# Patient Record
Sex: Male | Born: 1945 | Race: White | Hispanic: No | Marital: Single | Smoking: Current every day smoker
Health system: Southern US, Community
[De-identification: ages and names within clinical notes are randomized; demographics above are authoritative.]

## PROBLEM LIST (undated history)

## (undated) DIAGNOSIS — K5792 Diverticulitis of intestine, part unspecified, without perforation or abscess without bleeding: Secondary | ICD-10-CM

## (undated) DIAGNOSIS — N529 Male erectile dysfunction, unspecified: Secondary | ICD-10-CM

## (undated) DIAGNOSIS — F329 Major depressive disorder, single episode, unspecified: Secondary | ICD-10-CM

## (undated) DIAGNOSIS — N200 Calculus of kidney: Secondary | ICD-10-CM

## (undated) DIAGNOSIS — I255 Ischemic cardiomyopathy: Secondary | ICD-10-CM

## (undated) DIAGNOSIS — I219 Acute myocardial infarction, unspecified: Secondary | ICD-10-CM

## (undated) DIAGNOSIS — I2102 ST elevation (STEMI) myocardial infarction involving left anterior descending coronary artery: Secondary | ICD-10-CM

## (undated) DIAGNOSIS — I714 Abdominal aortic aneurysm, without rupture: Secondary | ICD-10-CM

## (undated) DIAGNOSIS — I2510 Atherosclerotic heart disease of native coronary artery without angina pectoris: Secondary | ICD-10-CM

## (undated) DIAGNOSIS — Z923 Personal history of irradiation: Secondary | ICD-10-CM

## (undated) DIAGNOSIS — Z9889 Other specified postprocedural states: Secondary | ICD-10-CM

## (undated) DIAGNOSIS — I712 Thoracic aortic aneurysm, without rupture: Secondary | ICD-10-CM

## (undated) DIAGNOSIS — I10 Essential (primary) hypertension: Secondary | ICD-10-CM

## (undated) DIAGNOSIS — N402 Nodular prostate without lower urinary tract symptoms: Secondary | ICD-10-CM

## (undated) DIAGNOSIS — E785 Hyperlipidemia, unspecified: Secondary | ICD-10-CM

## (undated) DIAGNOSIS — I739 Peripheral vascular disease, unspecified: Secondary | ICD-10-CM

## (undated) HISTORY — PX: ABDOMINAL AORTIC ANEURYSM REPAIR: SUR1152

## (undated) HISTORY — PX: OTHER SURGICAL HISTORY: SHX169

## (undated) HISTORY — PX: ABDOMINAL AORTIC ANEURYSM REPAIR: SHX42

## (undated) HISTORY — DX: Peripheral vascular disease, unspecified: I73.9

## (undated) HISTORY — DX: Hyperlipidemia, unspecified: E78.5

## (undated) HISTORY — DX: Major depressive disorder, single episode, unspecified: F32.9

## (undated) HISTORY — DX: Male erectile dysfunction, unspecified: N52.9

## (undated) HISTORY — DX: Calculus of kidney: N20.0

## (undated) HISTORY — DX: Diverticulitis of intestine, part unspecified, without perforation or abscess without bleeding: K57.92

## (undated) HISTORY — DX: Nodular prostate without lower urinary tract symptoms: N40.2

---
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
## 1997-08-24 ENCOUNTER — Encounter (HOSPITAL_COMMUNITY): Admission: RE | Admit: 1997-08-24 | Discharge: 1997-11-22 | Payer: Self-pay | Admitting: Gastroenterology

## 1997-12-08 ENCOUNTER — Ambulatory Visit (HOSPITAL_COMMUNITY): Admission: RE | Admit: 1997-12-08 | Discharge: 1997-12-08 | Payer: Self-pay | Admitting: Gastroenterology

## 1998-03-09 ENCOUNTER — Ambulatory Visit (HOSPITAL_COMMUNITY): Admission: RE | Admit: 1998-03-09 | Discharge: 1998-03-09 | Payer: Self-pay | Admitting: Gastroenterology

## 1998-04-10 ENCOUNTER — Encounter (HOSPITAL_COMMUNITY): Admission: RE | Admit: 1998-04-10 | Discharge: 1998-07-09 | Payer: Self-pay | Admitting: Gastroenterology

## 1998-07-31 ENCOUNTER — Encounter (HOSPITAL_COMMUNITY): Admission: RE | Admit: 1998-07-31 | Discharge: 1998-10-29 | Payer: Self-pay | Admitting: Gastroenterology

## 1998-11-21 ENCOUNTER — Encounter (HOSPITAL_COMMUNITY): Admission: RE | Admit: 1998-11-21 | Discharge: 1999-02-19 | Payer: Self-pay | Admitting: Gastroenterology

## 1998-12-17 ENCOUNTER — Emergency Department (HOSPITAL_COMMUNITY): Admission: EM | Admit: 1998-12-17 | Discharge: 1998-12-17 | Payer: Self-pay | Admitting: Emergency Medicine

## 1998-12-17 ENCOUNTER — Encounter: Payer: Self-pay | Admitting: Emergency Medicine

## 1999-03-15 ENCOUNTER — Encounter (HOSPITAL_COMMUNITY): Admission: RE | Admit: 1999-03-15 | Discharge: 1999-06-13 | Payer: Self-pay | Admitting: Gastroenterology

## 1999-06-06 ENCOUNTER — Encounter: Payer: Self-pay | Admitting: Emergency Medicine

## 1999-06-06 ENCOUNTER — Emergency Department (HOSPITAL_COMMUNITY): Admission: EM | Admit: 1999-06-06 | Discharge: 1999-06-06 | Payer: Self-pay | Admitting: Emergency Medicine

## 1999-06-07 ENCOUNTER — Ambulatory Visit (HOSPITAL_COMMUNITY): Admission: RE | Admit: 1999-06-07 | Discharge: 1999-06-07 | Payer: Self-pay | Admitting: Emergency Medicine

## 1999-06-07 ENCOUNTER — Encounter: Payer: Self-pay | Admitting: Emergency Medicine

## 1999-06-08 ENCOUNTER — Emergency Department (HOSPITAL_COMMUNITY): Admission: EM | Admit: 1999-06-08 | Discharge: 1999-06-09 | Payer: Self-pay | Admitting: Emergency Medicine

## 1999-06-17 ENCOUNTER — Ambulatory Visit (HOSPITAL_COMMUNITY): Admission: RE | Admit: 1999-06-17 | Discharge: 1999-06-18 | Payer: Self-pay | Admitting: *Deleted

## 1999-06-17 ENCOUNTER — Encounter (INDEPENDENT_AMBULATORY_CARE_PROVIDER_SITE_OTHER): Payer: Self-pay | Admitting: *Deleted

## 1999-07-25 ENCOUNTER — Encounter (HOSPITAL_COMMUNITY): Admission: RE | Admit: 1999-07-25 | Discharge: 1999-10-23 | Payer: Self-pay | Admitting: Gastroenterology

## 1999-12-05 ENCOUNTER — Encounter (HOSPITAL_COMMUNITY): Admission: RE | Admit: 1999-12-05 | Discharge: 2000-03-04 | Payer: Self-pay | Admitting: Gastroenterology

## 2000-05-04 ENCOUNTER — Encounter (HOSPITAL_COMMUNITY): Admission: RE | Admit: 2000-05-04 | Discharge: 2000-08-02 | Payer: Self-pay | Admitting: Gastroenterology

## 2000-09-08 ENCOUNTER — Encounter (HOSPITAL_COMMUNITY): Admission: RE | Admit: 2000-09-08 | Discharge: 2000-10-28 | Payer: Self-pay | Admitting: Gastroenterology

## 2000-10-30 ENCOUNTER — Encounter (HOSPITAL_COMMUNITY): Admission: RE | Admit: 2000-10-30 | Discharge: 2001-01-28 | Payer: Self-pay | Admitting: Gastroenterology

## 2001-03-18 ENCOUNTER — Encounter (HOSPITAL_COMMUNITY): Admission: RE | Admit: 2001-03-18 | Discharge: 2001-06-16 | Payer: Self-pay | Admitting: Gastroenterology

## 2001-07-12 ENCOUNTER — Encounter (HOSPITAL_COMMUNITY): Admission: RE | Admit: 2001-07-12 | Discharge: 2001-10-10 | Payer: Self-pay | Admitting: Gastroenterology

## 2001-11-01 ENCOUNTER — Encounter (HOSPITAL_COMMUNITY): Admission: RE | Admit: 2001-11-01 | Discharge: 2002-01-30 | Payer: Self-pay | Admitting: Gastroenterology

## 2002-03-02 ENCOUNTER — Encounter (HOSPITAL_COMMUNITY): Admission: RE | Admit: 2002-03-02 | Discharge: 2002-05-31 | Payer: Self-pay | Admitting: Gastroenterology

## 2002-07-06 ENCOUNTER — Encounter (HOSPITAL_COMMUNITY): Admission: RE | Admit: 2002-07-06 | Discharge: 2002-10-04 | Payer: Self-pay | Admitting: Gastroenterology

## 2002-11-22 ENCOUNTER — Encounter (HOSPITAL_COMMUNITY): Admission: RE | Admit: 2002-11-22 | Discharge: 2003-02-20 | Payer: Self-pay | Admitting: Gastroenterology

## 2003-03-09 ENCOUNTER — Encounter (HOSPITAL_COMMUNITY): Admission: RE | Admit: 2003-03-09 | Discharge: 2003-06-07 | Payer: Self-pay | Admitting: Gastroenterology

## 2003-03-21 ENCOUNTER — Emergency Department (HOSPITAL_COMMUNITY): Admission: EM | Admit: 2003-03-21 | Discharge: 2003-03-21 | Payer: Self-pay | Admitting: Emergency Medicine

## 2003-06-08 ENCOUNTER — Ambulatory Visit (HOSPITAL_COMMUNITY): Admission: RE | Admit: 2003-06-08 | Discharge: 2003-06-08 | Payer: Self-pay | Admitting: Gastroenterology

## 2004-07-09 ENCOUNTER — Encounter (HOSPITAL_COMMUNITY): Admission: RE | Admit: 2004-07-09 | Discharge: 2004-10-07 | Payer: Self-pay | Admitting: Gastroenterology

## 2004-10-24 ENCOUNTER — Encounter (HOSPITAL_COMMUNITY): Admission: RE | Admit: 2004-10-24 | Discharge: 2004-11-25 | Payer: Self-pay | Admitting: Gastroenterology

## 2005-02-28 ENCOUNTER — Encounter (HOSPITAL_COMMUNITY): Admission: RE | Admit: 2005-02-28 | Discharge: 2005-05-29 | Payer: Self-pay | Admitting: Gastroenterology

## 2005-06-13 ENCOUNTER — Encounter (HOSPITAL_COMMUNITY): Admission: RE | Admit: 2005-06-13 | Discharge: 2005-09-11 | Payer: Self-pay | Admitting: Gastroenterology

## 2005-10-16 ENCOUNTER — Encounter (HOSPITAL_COMMUNITY): Admission: RE | Admit: 2005-10-16 | Discharge: 2006-01-14 | Payer: Self-pay | Admitting: Gastroenterology

## 2006-02-05 ENCOUNTER — Encounter (HOSPITAL_COMMUNITY): Admission: RE | Admit: 2006-02-05 | Discharge: 2006-02-05 | Payer: Self-pay | Admitting: Gastroenterology

## 2006-03-31 DIAGNOSIS — I716 Thoracoabdominal aortic aneurysm, without rupture, unspecified: Secondary | ICD-10-CM

## 2006-03-31 DIAGNOSIS — I712 Thoracic aortic aneurysm, without rupture, unspecified: Secondary | ICD-10-CM | POA: Insufficient documentation

## 2006-03-31 DIAGNOSIS — Z8679 Personal history of other diseases of the circulatory system: Secondary | ICD-10-CM | POA: Insufficient documentation

## 2006-03-31 HISTORY — DX: Thoracoabdominal aortic aneurysm, without rupture, unspecified: I71.60

## 2006-03-31 HISTORY — PX: ABDOMINAL AORTIC ANEURYSM REPAIR: SUR1152

## 2006-03-31 HISTORY — DX: Thoracoabdominal aortic aneurysm, without rupture: I71.6

## 2006-08-06 ENCOUNTER — Encounter (HOSPITAL_COMMUNITY): Admission: RE | Admit: 2006-08-06 | Discharge: 2006-11-04 | Payer: Self-pay | Admitting: Gastroenterology

## 2006-11-10 ENCOUNTER — Encounter (HOSPITAL_COMMUNITY): Admission: RE | Admit: 2006-11-10 | Discharge: 2007-01-04 | Payer: Self-pay | Admitting: Gastroenterology

## 2007-01-07 ENCOUNTER — Encounter: Admission: RE | Admit: 2007-01-07 | Discharge: 2007-04-07 | Payer: Self-pay | Admitting: Gastroenterology

## 2007-04-15 ENCOUNTER — Encounter (HOSPITAL_COMMUNITY): Admission: RE | Admit: 2007-04-15 | Discharge: 2007-07-14 | Payer: Self-pay | Admitting: Gastroenterology

## 2010-12-23 LAB — IRON AND TIBC
Iron: <10 — ABNORMAL LOW
UIBC: 270

## 2010-12-23 LAB — CBC
HCT: 30.5 — ABNORMAL LOW
Hemoglobin: 10 — ABNORMAL LOW
MCHC: 32.9
MCV: 68.5 — ABNORMAL LOW
Platelets: 305
RBC: 4.45
RDW: 18 — ABNORMAL HIGH
WBC: 5.2

## 2011-01-03 LAB — CBC
Platelets: 319
RDW: 19.1 — ABNORMAL HIGH

## 2011-01-13 LAB — CBC
Hemoglobin: 11.5 — ABNORMAL LOW
RBC: 4.51
WBC: 4.8

## 2014-03-31 HISTORY — PX: COLONOSCOPY: SHX174

## 2016-09-19 ENCOUNTER — Encounter (HOSPITAL_COMMUNITY): Payer: Self-pay

## 2016-09-19 DIAGNOSIS — I10 Essential (primary) hypertension: Secondary | ICD-10-CM | POA: Insufficient documentation

## 2016-09-19 DIAGNOSIS — F17200 Nicotine dependence, unspecified, uncomplicated: Secondary | ICD-10-CM | POA: Insufficient documentation

## 2016-09-19 DIAGNOSIS — K0889 Other specified disorders of teeth and supporting structures: Secondary | ICD-10-CM | POA: Diagnosis present

## 2016-09-19 NOTE — ED Triage Notes (Signed)
Pt complaining of L lower dental pain. Pt states feels has an abscess. Pt denies any fevers/chills.

## 2016-09-20 ENCOUNTER — Emergency Department (HOSPITAL_COMMUNITY)
Admission: EM | Admit: 2016-09-20 | Discharge: 2016-09-20 | Disposition: A | Discharge status: Home / Self Care | Payer: Medicare HMO | Attending: Emergency Medicine | Admitting: Emergency Medicine

## 2016-09-20 DIAGNOSIS — K0889 Other specified disorders of teeth and supporting structures: Secondary | ICD-10-CM

## 2016-09-20 HISTORY — DX: Essential (primary) hypertension: I10

## 2016-09-20 MED ORDER — PENICILLIN V POTASSIUM 250 MG PO TABS
500.0000 mg | ORAL_TABLET | Freq: Once | ORAL | Status: AC
  Administered 2016-09-20: 500 mg via ORAL
  Filled 2016-09-20: qty 2

## 2016-09-20 MED ORDER — PENICILLIN V POTASSIUM 500 MG PO TABS: 500.0000 mg | ORAL_TABLET | Freq: Four times a day (QID) | ORAL | 0 refills | Status: AC

## 2016-09-20 NOTE — ED Notes (Signed)
Pt verbalized understanding discharge instructions and denies any further needs or questions at this time. VS stable, ambulatory and steady gait.   

## 2016-09-20 NOTE — ED Provider Notes (Signed)
[facility] DEPT Provider Note   CSN: [id number] Arrival date & time: 09/19/16  2233     History   Chief Complaint Chief Complaint  Patient presents with  . Dental Pain    HPI Patrick Adams is a 71 y.o. malewith history of hypertension who presents with a one-day history of left lower dental pain. Patient reports he woke up this morning with pain in his left lower molar radiating to his jaw and neck. He feels that it is swollen. Patient reports she has a known cavity in the tooth, and planned next week to have his dentist take care of it. Patient reports it had been hurting him prior to today. Patient took Tylenol at home with good relief. He is concerned that it may be infected as he has not had much of an appetite and has had some nausea today. Patient denies any fever or other symptoms.  HPI  Past Medical History:  Diagnosis Date  . Hypertension     There are no active problems to display for this patient.   History reviewed. No pertinent surgical history.     Home Medications    Prior to Admission medications   Medication Sig Start Date End Date Taking? Authorizing Provider  penicillin v potassium (VEETID) 500 MG tablet Take 1 tablet (500 mg total) by mouth 4 (four) times daily. 09/20/16 09/27/16  Frederica Kuster, PA-C    Family History History reviewed. No pertinent family history.  Social History Social History  Substance Use Topics  . Smoking status: Current Every Day Smoker  . Smokeless tobacco: Never Used  . Alcohol use No     Allergies   Statins   Review of Systems Review of Systems  Constitutional: Negative for fever.  HENT: Positive for dental problem.   Gastrointestinal: Positive for nausea. Negative for vomiting.     Physical Exam Updated Vital Signs BP 133/71 (BP Location: Right Arm)   Pulse 70   Temp 98.3 F (36.8 C) (Oral)   Resp 18   SpO2 100%   Physical Exam  Constitutional: He appears well-developed and well-nourished.  No distress.  HENT:  Head: Normocephalic and atraumatic.  Mouth/Throat: Oropharynx is clear and moist. No trismus in the jaw. No uvula swelling. No oropharyngeal exudate.    No tenderness to the floor the mouth  Eyes: Conjunctivae are normal. Pupils are equal, round, and reactive to light. Right eye exhibits no discharge. Left eye exhibits no discharge. No scleral icterus.  Neck: Normal range of motion. Neck supple. No edema and no erythema present. No thyromegaly present.  Cardiovascular: Normal rate, regular rhythm, normal heart sounds and intact distal pulses.  Exam reveals no gallop and no friction rub.   No murmur heard. Pulmonary/Chest: Effort normal and breath sounds normal. No stridor. No respiratory distress. He has no wheezes. He has no rales.  Musculoskeletal: He exhibits no edema.  Lymphadenopathy:    He has no cervical adenopathy.  Neurological: He is alert. Coordination normal.  Skin: Skin is warm and dry. No rash noted. He is not diaphoretic. No pallor.  Psychiatric: He has a normal mood and affect.  Nursing note and vitals reviewed.    ED Treatments / Results  Labs (all labs ordered are listed, but only abnormal results are displayed) Labs Reviewed - No data to display  EKG  EKG Interpretation None       Radiology No results found.  Procedures Procedures (including critical care time)  Medications Ordered in ED Medications  penicillin v potassium (VEETID) tablet 500 mg (500 mg Oral Given 09/20/16 0128)     Initial Impression / Assessment and Plan / ED Course  I have reviewed the triage vital signs and the nursing notes.  Pertinent labs & imaging results that were available during my care of the patient were reviewed by me and considered in my medical decision making (see chart for details).     Patient with dentalgia.  No abscess requiring immediate incision and drainage.  Exam not concerning for Ludwig's angina or pharyngeal abscess.  Will treat  with penicillin. Pt instructed to follow-up with dentist as soon as possible.  Discussed return precautions. Patient understands and agrees with plan. Pt safe for discharge. Patient also evaluated by Dr. Regenia Skeeter who agrees with plan.   Final Clinical Impressions(s) / ED Diagnoses   Final diagnoses:  Pain, dental    New Prescriptions Discharge Medication List as of 09/20/2016  1:14 AM    START taking these medications   Details  penicillin v potassium (VEETID) 500 MG tablet Take 1 tablet (500 mg total) by mouth 4 (four) times daily., Starting Sat 09/20/2016, Until Sat 09/27/2016, Print         Tonnette Zwiebel, [place], PA-C 09/20/16 [place], MD 09/22/16 [phone]

## 2016-09-20 NOTE — Discharge Instructions (Signed)
Medications: Penicillin  Treatment: take penicillin 4 times daily for 7 days as prescribed. Make sure to finish all this medication.  Follow-up: Please see your dentist as soon as possible for further evaluation and treatment. Please return to emergency department if you develop any new or worsening symptoms.

## 2018-01-29 DIAGNOSIS — I2510 Atherosclerotic heart disease of native coronary artery without angina pectoris: Secondary | ICD-10-CM

## 2018-01-29 DIAGNOSIS — Z9861 Coronary angioplasty status: Secondary | ICD-10-CM

## 2018-01-29 HISTORY — DX: Coronary angioplasty status: Z98.61

## 2018-01-29 HISTORY — DX: Atherosclerotic heart disease of native coronary artery without angina pectoris: I25.10

## 2018-01-31 ENCOUNTER — Emergency Department (HOSPITAL_COMMUNITY): Admit: 2018-01-31 | Payer: Medicare HMO | Admitting: Cardiology

## 2018-01-31 ENCOUNTER — Encounter (HOSPITAL_COMMUNITY): Payer: Self-pay

## 2018-01-31 ENCOUNTER — Inpatient Hospital Stay (HOSPITAL_COMMUNITY): Payer: Medicare HMO

## 2018-01-31 ENCOUNTER — Other Ambulatory Visit: Payer: Self-pay

## 2018-01-31 ENCOUNTER — Emergency Department (HOSPITAL_COMMUNITY): Payer: Medicare HMO

## 2018-01-31 ENCOUNTER — Encounter (HOSPITAL_COMMUNITY)
Admission: EM | Disposition: A | Discharge status: Home / Self Care | Payer: Self-pay | Source: Home / Self Care | Attending: Cardiology

## 2018-01-31 ENCOUNTER — Inpatient Hospital Stay (HOSPITAL_COMMUNITY)
Admission: EM | Admit: 2018-01-31 | Discharge: 2018-02-04 | DRG: 246 | Disposition: A | Discharge status: Home / Self Care | Payer: Medicare HMO | Attending: Cardiovascular Disease | Admitting: Cardiovascular Disease

## 2018-01-31 DIAGNOSIS — J449 Chronic obstructive pulmonary disease, unspecified: Secondary | ICD-10-CM | POA: Diagnosis present

## 2018-01-31 DIAGNOSIS — I2102 ST elevation (STEMI) myocardial infarction involving left anterior descending coronary artery: Principal | ICD-10-CM | POA: Diagnosis present

## 2018-01-31 DIAGNOSIS — Z812 Family history of tobacco abuse and dependence: Secondary | ICD-10-CM | POA: Diagnosis not present

## 2018-01-31 DIAGNOSIS — Z888 Allergy status to other drugs, medicaments and biological substances status: Secondary | ICD-10-CM

## 2018-01-31 DIAGNOSIS — F17200 Nicotine dependence, unspecified, uncomplicated: Secondary | ICD-10-CM | POA: Diagnosis present

## 2018-01-31 DIAGNOSIS — I2510 Atherosclerotic heart disease of native coronary artery without angina pectoris: Secondary | ICD-10-CM | POA: Clinically undetermined

## 2018-01-31 DIAGNOSIS — Z716 Tobacco abuse counseling: Secondary | ICD-10-CM

## 2018-01-31 DIAGNOSIS — Z8679 Personal history of other diseases of the circulatory system: Secondary | ICD-10-CM | POA: Diagnosis not present

## 2018-01-31 DIAGNOSIS — I110 Hypertensive heart disease with heart failure: Secondary | ICD-10-CM | POA: Diagnosis present

## 2018-01-31 DIAGNOSIS — I10 Essential (primary) hypertension: Secondary | ICD-10-CM | POA: Diagnosis present

## 2018-01-31 DIAGNOSIS — Z720 Tobacco use: Secondary | ICD-10-CM

## 2018-01-31 DIAGNOSIS — I5041 Acute combined systolic (congestive) and diastolic (congestive) heart failure: Secondary | ICD-10-CM | POA: Diagnosis present

## 2018-01-31 DIAGNOSIS — I255 Ischemic cardiomyopathy: Secondary | ICD-10-CM | POA: Diagnosis present

## 2018-01-31 DIAGNOSIS — I25119 Atherosclerotic heart disease of native coronary artery with unspecified angina pectoris: Secondary | ICD-10-CM | POA: Clinically undetermined

## 2018-01-31 DIAGNOSIS — I358 Other nonrheumatic aortic valve disorders: Secondary | ICD-10-CM | POA: Diagnosis present

## 2018-01-31 DIAGNOSIS — I25110 Atherosclerotic heart disease of native coronary artery with unstable angina pectoris: Secondary | ICD-10-CM | POA: Diagnosis present

## 2018-01-31 DIAGNOSIS — Z955 Presence of coronary angioplasty implant and graft: Secondary | ICD-10-CM

## 2018-01-31 DIAGNOSIS — I2109 ST elevation (STEMI) myocardial infarction involving other coronary artery of anterior wall: Secondary | ICD-10-CM | POA: Diagnosis present

## 2018-01-31 DIAGNOSIS — E785 Hyperlipidemia, unspecified: Secondary | ICD-10-CM | POA: Diagnosis present

## 2018-01-31 DIAGNOSIS — I213 ST elevation (STEMI) myocardial infarction of unspecified site: Secondary | ICD-10-CM

## 2018-01-31 HISTORY — DX: Ischemic cardiomyopathy: I25.5

## 2018-01-31 HISTORY — PX: LEFT HEART CATH AND CORONARY ANGIOGRAPHY: CATH118249

## 2018-01-31 HISTORY — DX: Hemochromatosis, unspecified: E83.119

## 2018-01-31 HISTORY — PX: CORONARY/GRAFT ACUTE MI REVASCULARIZATION: CATH118305

## 2018-01-31 HISTORY — DX: Other specified postprocedural states: Z98.890

## 2018-01-31 HISTORY — DX: ST elevation (STEMI) myocardial infarction involving left anterior descending coronary artery: I21.02

## 2018-01-31 LAB — LIPID PANEL
CHOLESTEROL: 208 mg/dL — AB (ref 0–200)
Cholesterol: 226 mg/dL — ABNORMAL HIGH (ref 0–200)
HDL: 36 mg/dL — ABNORMAL LOW (ref >40)
HDL: 34 mg/dL — ABNORMAL LOW (ref >40)
LDL CALC: 137 mg/dL — AB (ref 0–99)
LDL Cholesterol: 138 mg/dL — ABNORMAL HIGH (ref 0–99)
TRIGLYCERIDES: 263 mg/dL — AB (ref <150)
Total CHOL/HDL Ratio: 6.3 RATIO
Total CHOL/HDL Ratio: 6.1 RATIO
Triglycerides: 180 mg/dL — ABNORMAL HIGH (ref <150)
VLDL: 53 mg/dL — AB (ref 0–40)
VLDL: 36 mg/dL (ref 0–40)

## 2018-01-31 LAB — CBC
HCT: 43.8 % (ref 39.0–52.0)
HEMOGLOBIN: 14.4 g/dL (ref 13.0–17.0)
MCH: 28.9 pg (ref 26.0–34.0)
MCHC: 32.9 g/dL (ref 30.0–36.0)
MCV: 87.8 fL (ref 80.0–100.0)
NRBC: 0 % (ref 0.0–0.2)
PLATELETS: 259 10*3/uL (ref 150–400)
RBC: 4.99 MIL/uL (ref 4.22–5.81)
RDW: 12.9 % (ref 11.5–15.5)
WBC: 6.1 10*3/uL (ref 4.0–10.5)

## 2018-01-31 LAB — CBC WITH DIFFERENTIAL/PLATELET
ABS IMMATURE GRANULOCYTES: 0.02 10*3/uL (ref 0.00–0.07)
BASOS PCT: 0 %
Basophils Absolute: 0 10*3/uL (ref 0.0–0.1)
EOS PCT: 3 %
Eosinophils Absolute: 0.2 10*3/uL (ref 0.0–0.5)
HCT: 46.2 % (ref 39.0–52.0)
HEMOGLOBIN: 15.3 g/dL (ref 13.0–17.0)
Immature Granulocytes: 0 %
LYMPHS PCT: 26 %
Lymphs Abs: 1.9 10*3/uL (ref 0.7–4.0)
MCH: 29.2 pg (ref 26.0–34.0)
MCHC: 33.1 g/dL (ref 30.0–36.0)
MCV: 88.2 fL (ref 80.0–100.0)
MONOS PCT: 7 %
Monocytes Absolute: 0.5 10*3/uL (ref 0.1–1.0)
NEUTROS ABS: 4.6 10*3/uL (ref 1.7–7.7)
Neutrophils Relative %: 64 %
Platelets: 258 10*3/uL (ref 150–400)
RBC: 5.24 MIL/uL (ref 4.22–5.81)
RDW: 12.8 % (ref 11.5–15.5)
WBC: 7.3 10*3/uL (ref 4.0–10.5)
nRBC: 0 % (ref 0.0–0.2)

## 2018-01-31 LAB — COMPREHENSIVE METABOLIC PANEL
ALBUMIN: 3.6 g/dL (ref 3.5–5.0)
ALK PHOS: 106 U/L (ref 38–126)
ALK PHOS: 115 U/L (ref 38–126)
ALT: 13 U/L (ref 0–44)
ALT: 13 U/L (ref 0–44)
AST: 19 U/L (ref 15–41)
AST: 23 U/L (ref 15–41)
Albumin: 3.4 g/dL — ABNORMAL LOW (ref 3.5–5.0)
Anion gap: 12 (ref 5–15)
Anion gap: 10 (ref 5–15)
BILIRUBIN TOTAL: 1 mg/dL (ref 0.3–1.2)
BILIRUBIN TOTAL: 1.5 mg/dL — AB (ref 0.3–1.2)
BUN: 15 mg/dL (ref 8–23)
BUN: 15 mg/dL (ref 8–23)
CALCIUM: 9 mg/dL (ref 8.9–10.3)
CALCIUM: 9.6 mg/dL (ref 8.9–10.3)
CO2: 18 mmol/L — AB (ref 22–32)
CO2: 21 mmol/L — ABNORMAL LOW (ref 22–32)
CREATININE: 1.27 mg/dL — AB (ref 0.61–1.24)
CREATININE: 1.29 mg/dL — AB (ref 0.61–1.24)
Chloride: 104 mmol/L (ref 98–111)
Chloride: 102 mmol/L (ref 98–111)
GFR calc Af Amer: >60 mL/min (ref >60)
GFR calc Af Amer: >60 mL/min (ref >60)
GFR calc non Af Amer: 55 mL/min — ABNORMAL LOW (ref >60)
GFR calc non Af Amer: 56 mL/min — ABNORMAL LOW (ref >60)
GLUCOSE: 164 mg/dL — AB (ref 70–99)
Glucose, Bld: 196 mg/dL — ABNORMAL HIGH (ref 70–99)
POTASSIUM: 2.9 mmol/L — AB (ref 3.5–5.1)
Potassium: 3.8 mmol/L (ref 3.5–5.1)
SODIUM: 134 mmol/L — AB (ref 135–145)
Sodium: 133 mmol/L — ABNORMAL LOW (ref 135–145)
TOTAL PROTEIN: 6.1 g/dL — AB (ref 6.5–8.1)
Total Protein: 6.1 g/dL — ABNORMAL LOW (ref 6.5–8.1)

## 2018-01-31 LAB — APTT
aPTT: 32 seconds (ref 24–36)
aPTT: 200 seconds (ref 24–36)

## 2018-01-31 LAB — PROTIME-INR
INR: 1.01
INR: 1.19
PROTHROMBIN TIME: 15 s (ref 11.4–15.2)
Prothrombin Time: 13.2 seconds (ref 11.4–15.2)

## 2018-01-31 LAB — TROPONIN I
TROPONIN I: 0.11 ng/mL — AB (ref <0.03)
Troponin I: 0.1 ng/mL (ref <0.03)

## 2018-01-31 LAB — MRSA PCR SCREENING: MRSA by PCR: NEGATIVE

## 2018-01-31 LAB — HEMOGLOBIN A1C
Hgb A1c MFr Bld: 4.9 % (ref 4.8–5.6)
MEAN PLASMA GLUCOSE: 93.93 mg/dL

## 2018-01-31 SURGERY — CORONARY/GRAFT ACUTE MI REVASCULARIZATION
Anesthesia: LOCAL

## 2018-01-31 MED ORDER — VERAPAMIL HCL 2.5 MG/ML IV SOLN
INTRAVENOUS | Status: DC | PRN
  Administered 2018-01-31: 10 mL via INTRA_ARTERIAL

## 2018-01-31 MED ORDER — SODIUM CHLORIDE 0.9 % IV SOLN: INTRAVENOUS | Status: DC

## 2018-01-31 MED ORDER — HEPARIN SODIUM (PORCINE) 5000 UNIT/ML IJ SOLN
4000.0000 [IU] | Freq: Once | INTRAMUSCULAR | Status: AC
  Administered 2018-01-31: 4000 [IU] via INTRAVENOUS

## 2018-01-31 MED ORDER — LIDOCAINE HCL (PF) 1 % IJ SOLN
INTRAMUSCULAR | Status: AC
  Filled 2018-01-31: qty 30

## 2018-01-31 MED ORDER — SODIUM CHLORIDE 0.9 % IV SOLN: 250.0000 mL | INTRAVENOUS | Status: DC | PRN

## 2018-01-31 MED ORDER — MIDAZOLAM HCL 2 MG/2ML IJ SOLN
INTRAMUSCULAR | Status: AC
  Filled 2018-01-31: qty 2

## 2018-01-31 MED ORDER — HEPARIN SODIUM (PORCINE) 1000 UNIT/ML IJ SOLN
INTRAMUSCULAR | Status: AC
  Filled 2018-01-31: qty 1

## 2018-01-31 MED ORDER — ONDANSETRON HCL 4 MG/2ML IJ SOLN
4.0000 mg | Freq: Four times a day (QID) | INTRAMUSCULAR | Status: DC | PRN
  Administered 2018-02-01: 4 mg via INTRAVENOUS
  Filled 2018-01-31: qty 2

## 2018-01-31 MED ORDER — MIDAZOLAM HCL 2 MG/2ML IJ SOLN
INTRAMUSCULAR | Status: DC | PRN
  Administered 2018-01-31: 2 mg via INTRAVENOUS
  Administered 2018-01-31: 1 mg via INTRAVENOUS

## 2018-01-31 MED ORDER — FENTANYL CITRATE (PF) 100 MCG/2ML IJ SOLN
INTRAMUSCULAR | Status: AC
  Filled 2018-01-31: qty 2

## 2018-01-31 MED ORDER — TICAGRELOR 90 MG PO TABS
ORAL_TABLET | ORAL | Status: AC
  Filled 2018-01-31: qty 2

## 2018-01-31 MED ORDER — HEPARIN (PORCINE) IN NACL 100-0.45 UNIT/ML-% IJ SOLN
1250.0000 [IU]/h | INTRAMUSCULAR | Status: DC
  Administered 2018-02-01: 1100 [IU]/h via INTRAVENOUS
  Administered 2018-02-02 (×2): 1250 [IU]/h via INTRAVENOUS
  Filled 2018-01-31 (×3): qty 250

## 2018-01-31 MED ORDER — ACETAMINOPHEN 325 MG PO TABS
650.0000 mg | ORAL_TABLET | ORAL | Status: DC | PRN
  Administered 2018-02-01 – 2018-02-03 (×2): 650 mg via ORAL
  Filled 2018-01-31 (×2): qty 2

## 2018-01-31 MED ORDER — MORPHINE SULFATE (PF) 2 MG/ML IV SOLN
INTRAVENOUS | Status: DC | PRN
  Administered 2018-01-31: 2 mg via INTRAVENOUS

## 2018-01-31 MED ORDER — SODIUM CHLORIDE 0.9 % IV SOLN
INTRAVENOUS | Status: DC
  Administered 2018-01-31 – 2018-02-02 (×3): via INTRAVENOUS

## 2018-01-31 MED ORDER — HYDRALAZINE HCL 20 MG/ML IJ SOLN: 5.0000 mg | INTRAMUSCULAR | Status: AC | PRN

## 2018-01-31 MED ORDER — MORPHINE SULFATE (PF) 10 MG/ML IV SOLN
INTRAVENOUS | Status: AC
  Filled 2018-01-31: qty 1

## 2018-01-31 MED ORDER — SODIUM CHLORIDE 0.9 % IV SOLN
INTRAVENOUS | Status: DC | PRN
  Administered 2018-01-31: 1.75 mg/kg/h via INTRAVENOUS

## 2018-01-31 MED ORDER — TICAGRELOR 90 MG PO TABS
ORAL_TABLET | ORAL | Status: DC | PRN
  Administered 2018-01-31: 180 mg via ORAL

## 2018-01-31 MED ORDER — LIDOCAINE HCL (PF) 1 % IJ SOLN
INTRAMUSCULAR | Status: DC | PRN
  Administered 2018-01-31: 2 mL

## 2018-01-31 MED ORDER — BIVALIRUDIN TRIFLUOROACETATE 250 MG IV SOLR
INTRAVENOUS | Status: AC
  Filled 2018-01-31: qty 250

## 2018-01-31 MED ORDER — NITROGLYCERIN 1 MG/10 ML FOR IR/CATH LAB
INTRA_ARTERIAL | Status: AC
  Filled 2018-01-31: qty 10

## 2018-01-31 MED ORDER — LABETALOL HCL 5 MG/ML IV SOLN: 10.0000 mg | INTRAVENOUS | Status: AC | PRN

## 2018-01-31 MED ORDER — IOHEXOL 350 MG/ML SOLN
INTRAVENOUS | Status: DC | PRN
  Administered 2018-01-31: 165 mL

## 2018-01-31 MED ORDER — HEPARIN (PORCINE) IN NACL 1000-0.9 UT/500ML-% IV SOLN
INTRAVENOUS | Status: DC | PRN
  Administered 2018-01-31 (×2): 500 mL

## 2018-01-31 MED ORDER — HEPARIN (PORCINE) IN NACL 1000-0.9 UT/500ML-% IV SOLN
INTRAVENOUS | Status: AC
  Filled 2018-01-31: qty 1000

## 2018-01-31 MED ORDER — SODIUM CHLORIDE 0.9 % IV SOLN
INTRAVENOUS | Status: DC | PRN
  Administered 2018-01-31: 10 mL/h via INTRAVENOUS

## 2018-01-31 MED ORDER — SODIUM CHLORIDE 0.9% FLUSH: 3.0000 mL | INTRAVENOUS | Status: DC | PRN

## 2018-01-31 MED ORDER — SODIUM CHLORIDE 0.9% FLUSH
3.0000 mL | Freq: Two times a day (BID) | INTRAVENOUS | Status: DC
  Administered 2018-02-01: 3 mL via INTRAVENOUS
  Administered 2018-02-02: 10 mL via INTRAVENOUS

## 2018-01-31 MED ORDER — BIVALIRUDIN BOLUS VIA INFUSION - CUPID
INTRAVENOUS | Status: DC | PRN
  Administered 2018-01-31: 60.75 mg via INTRAVENOUS

## 2018-01-31 MED ORDER — FENTANYL CITRATE (PF) 100 MCG/2ML IJ SOLN
INTRAMUSCULAR | Status: DC | PRN
  Administered 2018-01-31 (×2): 25 ug via INTRAVENOUS

## 2018-01-31 MED ORDER — TICAGRELOR 90 MG PO TABS
90.0000 mg | ORAL_TABLET | Freq: Two times a day (BID) | ORAL | Status: DC
  Administered 2018-02-01 – 2018-02-04 (×6): 90 mg via ORAL
  Filled 2018-01-31 (×6): qty 1

## 2018-01-31 MED ORDER — SODIUM CHLORIDE 0.9 % IV SOLN
INTRAVENOUS | Status: DC | PRN
  Administered 2018-01-31: 100 mL/h via INTRAVENOUS

## 2018-01-31 MED ORDER — ASPIRIN 81 MG PO CHEW
81.0000 mg | CHEWABLE_TABLET | Freq: Every day | ORAL | Status: DC
  Administered 2018-02-01 – 2018-02-03 (×3): 81 mg via ORAL
  Filled 2018-01-31 (×3): qty 1

## 2018-01-31 MED ORDER — NITROGLYCERIN IN D5W 200-5 MCG/ML-% IV SOLN
0.0000 ug/min | INTRAVENOUS | Status: DC
Start: 2018-01-31 — End: 2018-01-31

## 2018-01-31 SURGICAL SUPPLY — 21 items
BALLN SAPPHIRE 2.0X12 (BALLOONS) ×2
BALLN SAPPHIRE ~~LOC~~ 4.0X12 (BALLOONS) ×1 IMPLANT
BALLN ~~LOC~~ EMERGE MR 3.5X15 (BALLOONS) ×2
BALLOON SAPPHIRE 2.0X12 (BALLOONS) IMPLANT
BALLOON ~~LOC~~ EMERGE MR 3.5X15 (BALLOONS) IMPLANT
CATH OPTITORQUE TIG 4.0 5F (CATHETERS) ×1 IMPLANT
CATH VISTA GUIDE 6FR XBLAD3.5 (CATHETERS) ×1 IMPLANT
DEVICE RAD COMP TR BAND LRG (VASCULAR PRODUCTS) ×1 IMPLANT
ELECT DEFIB PAD ADLT CADENCE (PAD) ×1 IMPLANT
GLIDESHEATH SLEND A-KIT 6F 22G (SHEATH) ×1 IMPLANT
GUIDEWIRE INQWIRE 1.5J.035X260 (WIRE) IMPLANT
INQWIRE 1.5J .035X260CM (WIRE) ×2
KIT ENCORE 26 ADVANTAGE (KITS) ×1 IMPLANT
KIT HEART LEFT (KITS) ×2 IMPLANT
PACK CARDIAC CATHETERIZATION (CUSTOM PROCEDURE TRAY) ×2 IMPLANT
STENT SYNERGY DES 2.75X38 (Permanent Stent) ×1 IMPLANT
TRANSDUCER W/STOPCOCK (MISCELLANEOUS) ×2 IMPLANT
TUBING CIL FLEX 10 FLL-RA (TUBING) ×2 IMPLANT
WIRE ASAHI PROWATER 180CM (WIRE) ×1 IMPLANT
WIRE FIGHTER CROSSING 190CM (WIRE) ×1 IMPLANT
WIRE PT2 MS 185 (WIRE) ×1 IMPLANT

## 2018-01-31 NOTE — Progress Notes (Signed)
Patient given a total of 2mg  of morphine. A total of 4 mg was documented and that is incorrect. Pharmacy was notified and requested documentation per this progress note. Witnesses include Judge Stall, RCIS, Gildardo Pounds, RN and Glenetta Hew, MD. Nurse on [facility] made aware that only 2 mg was actually given to the patient.

## 2018-01-31 NOTE — ED Provider Notes (Signed)
Emergency Department Provider Note   I have reviewed the triage vital signs and the nursing notes.   HISTORY  Chief Complaint Code STEMI   HPI Patrick Adams is a 72 y.o. male with PMH of HTN's to the emergency department for evaluation of left chest pain radiating to the arm and jaw.  Symptoms began at 5 PM and have progressively worsened.  EMS was called who administered 324 mg aspirin and 2 nitroglycerin in route.  Patient does have history of aortic aneurysm status post repair.  He denies similar symptoms in the past. No modifying factors. Pain is severe and pressure/squeezing in quality.   Past Medical History:  Diagnosis Date  . Hemochromatosis   . History of abdominal aortic aneurysm (AAA) repair    With thoracic aortic aneurysm being monitored  . Hypertension     Patient Active Problem List   Diagnosis Date Noted  . Acute ST elevation myocardial infarction (STEMI) involving left anterior descending coronary artery ([place]) 01/31/2018  . 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 Essential hypertension 01/31/2018  . Hyperlipidemia with target LDL less than 70 01/31/2018  . Acute ST elevation myocardial infarction (STEMI) involving left anterior descending (LAD) coronary artery ([place]) 01/31/2018  . Acute combined systolic and diastolic heart failure ([place]) 01/31/2018  . Ischemic cardiomyopathy 01/31/2018    Past Surgical History:  Procedure Laterality Date  . ABDOMINAL AORTIC ANEURYSM REPAIR      Allergies Statins  History reviewed. No pertinent family history.  Social History Social History   Tobacco Use  . Smoking status: Current Every Day Smoker  . Smokeless tobacco: Never Used  Substance Use Topics  . Alcohol use: No  . Drug use: No    Review of Systems  Constitutional: No fever/chills Eyes: No visual changes. ENT: No sore throat. Cardiovascular: Positive chest pain. Respiratory:  Denies shortness of breath. Gastrointestinal: No abdominal pain.  No nausea, no vomiting.  No diarrhea.  No constipation. Genitourinary: Negative for dysuria. Musculoskeletal: Negative for back pain. Skin: Negative for rash. Neurological: Negative for headaches, focal weakness or numbness.  10-point ROS otherwise negative.  ____________________________________________   PHYSICAL EXAM:  VITAL SIGNS: Vitals:   01/31/18 2200 01/31/18 2300  BP: 114/75 120/85  Pulse: 67 68  Resp: 16 18  Temp:    SpO2: 99% 100%   Constitutional: Alert and oriented. Patient appears acutely unwell and diaphoretic.  Eyes: Conjunctivae are normal.  Head: Atraumatic. Nose: No congestion/rhinnorhea. Mouth/Throat: Mucous membranes are moist.  Neck: No stridor.  Cardiovascular: Normal rate, regular rhythm. Good peripheral circulation. Grossly normal heart sounds.   Respiratory: Normal respiratory effort.  No retractions. Lungs CTAB. Gastrointestinal: No distention.  Musculoskeletal: No gross deformities of extremities. Neurologic:  Normal speech and language.  Skin:  Skin is warm but patient is diaphoretic.   ____________________________________________   LABS (all labs ordered are listed, but only abnormal results are displayed)  Labs Reviewed  COMPREHENSIVE METABOLIC PANEL - Abnormal; Notable for the following components:      Result Value   Sodium 134 (*)    CO2 18 (*)    Glucose, Bld 164 (*)    Creatinine, Ser 1.29 (*)    Total Protein 6.1 (*)    Total Bilirubin 1.5 (*)    GFR calc non Af Amer 55 (*)    All other components within normal limits  TROPONIN I - Abnormal; Notable for the following components:   Troponin I 0.10 (*)  All other components within normal limits  LIPID PANEL - Abnormal; Notable for the following components:   Cholesterol 226 (*)    Triglycerides 263 (*)    HDL 36 (*)    VLDL 53 (*)    LDL Cholesterol 137 (*)    All other components within normal limits    MRSA PCR SCREENING  CBC WITH DIFFERENTIAL/PLATELET  PROTIME-INR  APTT  LIPID PANEL  TROPONIN I  TROPONIN I  TROPONIN I  BASIC METABOLIC PANEL  CBC  HEMOGLOBIN A1C   ____________________________________________  EKG  Rate: 75. ST elevation V1-V3. Narrow QRS. Normal QTc. Positive STEMI.  ____________________________________________  RADIOLOGY  Dg Chest Port 1 View  Result Date: 01/31/2018 CLINICAL DATA:  Chest pain EXAM: PORTABLE CHEST 1 VIEW COMPARISON:  None. FINDINGS: Cardiac shadow is at the upper limits of normal in size. The lungs are well aerated bilaterally. No focal infiltrate is seen. No bony abnormality is noted. IMPRESSION: No acute abnormality noted. Electronically Signed   By: Inez Catalina M.D.   On: 01/31/2018 21:36   ____________________________________________   PROCEDURES  Procedure(s) performed:   Procedures  None  ____________________________________________   INITIAL IMPRESSION / ASSESSMENT AND PLAN / ED COURSE  Pertinent labs & imaging results that were available during my care of the patient were reviewed by me and considered in my medical decision making (see chart for details).  Arrives by EMS as a code STEMI.  Patient is diaphoretic and ill-appearing on arrival.  Evaluated by the STEMI team.  Cath Lab not immediately available so patient moved to ED room where heparin and nitroglycerin were started.  Patient maintaining his blood pressure and mental status.  He does not require airway protection.  After brief evaluation and stabilization he was transferred to the Cath Lab for intervention.   I reviewed all nursing notes, vitals, pertinent old records, EKGs, labs, imaging (as available).  ____________________________________________  FINAL CLINICAL IMPRESSION(S) / ED DIAGNOSES  Final diagnoses:  ST elevation myocardial infarction (STEMI), unspecified artery ([place])     MEDICATIONS GIVEN DURING THIS VISIT:  Medications  0.9 %  sodium  chloride infusion ( Intravenous Transfusing/Transfer 01/31/18 1900)  labetalol (NORMODYNE,TRANDATE) injection 10 mg (has no administration in time range)  hydrALAZINE (APRESOLINE) injection 5 mg (has no administration in time range)  acetaminophen (TYLENOL) tablet 650 mg (has no administration in time range)  ondansetron (ZOFRAN) injection 4 mg (has no administration in time range)  0.9 %  sodium chloride infusion (has no administration in time range)  sodium chloride flush (NS) 0.9 % injection 3 mL (has no administration in time range)  sodium chloride flush (NS) 0.9 % injection 3 mL (has no administration in time range)  0.9 %  sodium chloride infusion (has no administration in time range)  aspirin chewable tablet 81 mg (has no administration in time range)  ticagrelor (BRILINTA) tablet 90 mg (has no administration in time range)  heparin ADULT infusion 100 units/mL (25000 units/286mL sodium chloride 0.45%) (has no administration in time range)  heparin injection 4,000 Units (4,000 Units Intravenous Given 01/31/18 1859)    Note:  This document was prepared using Dragon voice recognition software and may include unintentional dictation errors.  Nanda Quinton, MD Emergency Medicine    Keshayla Schrum, [facility], MD 01/31/18 [phone]

## 2018-01-31 NOTE — H&P (Signed)
Cardiology Admission History and Physical:   Patient ID: Patrick Adams MRN: [id number]; DOB: Mar 14, 1946   Admission date: 01/31/2018  Primary Care Provider: Skeet Latch, NP Primary Cardiologist: No primary care provider on file.  New Primary Electrophysiologist:  None none  Chief Complaint: Chest pain  Patient Profile:   Patrick Adams is a 72 y.o. male with history of hypertension and hemochromatosis (treated with intermittent phlebotomy) also with what sounds like AAA status post repair with thoracic aortic aneurysm as well managed medically.  He was brought in by EMS for anterior-inferior ST elevation MI.  History of Present Illness:   Patrick Adams was doing well until roughly 5-5 30 this evening when he started having 5-7 out of 10 chest pain.  It then began to be close to 8/9 out of 10 chest pain he called EMS.  Upon arrival he was found to have anterior elevations in V2-V6 as well as inferior elevations in ruminal 2, 3 and aVF.  Code STEMI was called he was brought in to the most [facility] emergency room where he was stabilized awaiting Cath Lab team arrival.  He was then brought emergently to the cardiac catheterization lab for emergent catheterization.  EMS gave him 2 sodium nitro and 324 mg of aspirin with no resolution of chest pain.  In the ER he received 4000 units of IV heparin.  Past Medical History:  Diagnosis Date  . Hemochromatosis   . History of abdominal aortic aneurysm (AAA) repair    With thoracic aortic aneurysm being monitored  . Hypertension     Past Surgical History:  Procedure Laterality Date  . ABDOMINAL AORTIC ANEURYSM REPAIR       Medications Prior to Admission: Prior to Admission takes says he takes amlodipine for hypertension medications   Not on File     Allergies:    Allergies  Allergen Reactions  . Statins     Joint pain.    Social History:   Social History   Tobacco Use  . Smoking status: Current Every Day Smoker  . Smokeless  tobacco: Never Used  Substance Use Topics  . Alcohol use: No  . Drug use: No   Social History   Social History Narrative  . Not on file     Family History: Mother and father had MIs.  Both were in their 36s to 90s. The patient's family history is not on file.    ROS:  Review of Systems  Constitutional: Positive for malaise/fatigue (Felt bad today.). Negative for chills and fever.  HENT: Negative for nosebleeds.   Respiratory: Positive for shortness of breath (Today). Negative for cough.   Cardiovascular: Negative for leg swelling.  Gastrointestinal: Positive for nausea. Negative for blood in stool, melena and vomiting.  Genitourinary: Negative for hematuria.  Musculoskeletal: Negative for joint pain and myalgias.  Neurological: Positive for weakness (Generalized).  Psychiatric/Behavioral: Negative for depression and memory loss. The patient is not nervous/anxious.   All other systems reviewed and are negative.  Please see the history of present illness.  All other ROS reviewed and negative.     Physical Exam/Data:   Vitals:   01/31/18 1853  Height: 6' (1.829 m)    Intake/Output Summary (Last 24 hours) at 01/31/2018 1905 Last data filed at 01/31/2018 1901 Gross per 24 hour  Intake 0 ml  Output 0 ml  Net 0 ml   There were no vitals filed for this visit. There is no height or weight on file to calculate  BMI.  General:  Well nourished, well developed, in moderate distress.  Somewhat diaphoretic. HEENT: [place]/AT, EOMI.  MMM. Lymph: no adenopathy Neck: no JVD or carotid bruit Endocrine:  No thryomegaly Vascular: No carotid bruits; FA pulses 2+ bilaterally without bruits  Cardiac:  normal S1, S2; RRR; no murmur Lungs:  clear to auscultation bilaterally, no wheezing, rhonchi or rales  Abd: soft, nontender, no hepatomegaly  Ext: no pedal edema Musculoskeletal:  No deformities, BUE and BLE strength normal and equal Skin: warm and dry  Neuro:  CNs 2-12 intact, no focal  abnormalities noted Psych:  Normal affect    EKG:  The ECG that was done by EMS.  This was personally reviewed and demonstrates sinus rhythm rate 80 bpm.  3.5 mm ST elevations in V2 and V3 with slightly less elevations in V4 and V5.  Also 1 to 2 mm elevations in II, remember 3 and aVF.  No cervical change noted.  Relevant CV Studies: None available  Laboratory Data: Labs drawn in ER, but not yet ready  ChemistryNo results for input(s): NA, K, CL, CO2, GLUCOSE, BUN, CREATININE, CALCIUM, GFRNONAA, GFRAA, ANIONGAP in the last 168 hours.  No results for input(s): PROT, ALBUMIN, AST, ALT, ALKPHOS, BILITOT in the last 168 hours. HematologyNo results for input(s): WBC, RBC, HGB, HCT, MCV, MCH, MCHC, RDW, PLT in the last 168 hours. Cardiac EnzymesNo results for input(s): TROPONINI in the last 168 hours. No results for input(s): TROPIPOC in the last 168 hours.  BNPNo results for input(s): BNP, PROBNP in the last 168 hours.  DDimer No results for input(s): DDIMER in the last 168 hours.  Radiology/Studies:  No results found.  Assessment and Plan:   Acute anterior ST elevation MI --plan urgent/emergent cardiac catheterization with PCI if indicated.  Further plans based on results.  Will check lipid panel and diabetes panel. Reassess home medications and adjust accordingly. High-dose statin.  Severity of Illness: The appropriate patient status for this patient is INPATIENT. Inpatient status is judged to be reasonable and necessary in order to provide the required intensity of service to ensure the patient's safety. The patient's presenting symptoms, physical exam findings, and initial radiographic and laboratory data in the context of their chronic comorbidities is felt to place them at high risk for further clinical deterioration. Furthermore, it is not anticipated that the patient will be medically stable for discharge from the hospital within 2 midnights of admission. The following factors  support the patient status of inpatient.   " The patient's presenting symptoms include diaphoresis, chest pain, dyspnea.  Nausea. " The worrisome physical exam findings include diaphoretic somewhat pale in distress. " The initial radiographic and laboratory data are worrisome because of EKG with anterior and inferior ST elevations. " The chronic co-morbidities include hypertension, hemochromatosis and smoking    * I certify that at the point of admission it is my clinical judgment that the patient will require inpatient hospital care spanning beyond 2 midnights from the point of admission due to high intensity of service, high risk for further deterioration and high frequency of surveillance required.*    For questions or updates, please contact [facility] Please consult [email or web address] for contact info under        Signed, Glenetta Hew, MD  01/31/2018 7:05 PM

## 2018-01-31 NOTE — Progress Notes (Signed)
ANTICOAGULATION CONSULT NOTE - Initial Consult  Pharmacy Consult for heparin Indication: chest pain/ACS  Allergies  Allergen Reactions  . Statins     Joint pain.    Patient Measurements: Height: 6' (182.9 cm) IBW/kg (Calculated) : 77.6  Wt ~ 80kg   Vital Signs: Temp: 97.6 F (36.4 C) (11/03 2050) Temp Source: Oral (11/03 2050) BP: 113/83 (11/03 2100) Pulse Rate: 64 (11/03 2100)  Labs: Recent Labs    01/31/18 1900 01/31/18 1916 01/31/18 1928  HGB 15.3  --  14.4  HCT 46.2  --  43.8  PLT 258  --  259  APTT 32  --  200*  LABPROT 13.2  --  15.0  INR 1.01  --  1.19  CREATININE 1.29* 1.27*  --   TROPONINI 0.10*  --  0.11*    CrCl cannot be calculated (Unknown ideal weight.).   Medical History: Past Medical History:  Diagnosis Date  . Hemochromatosis   . History of abdominal aortic aneurysm (AAA) repair    With thoracic aortic aneurysm being monitored  . Hypertension     Medications:  No medications prior to admission.    Assessment: 72 yo male with STEMI s/p cath with DES placed and possible staged PCI. Pharmacy consulted to restart heparin 8 hrs post sheath (removed 8:18pm, TR band applied)  Goal of Therapy:  Heparin level 0.3-0.7 units/ml Monitor platelets by anticoagulation protocol: Yes   Plan:  -Start heparin at 1100 units/hr at 4:30pm -Heparin level in 8 hours and daily wth CBC daily  Hildred Laser, PharmD Clinical Pharmacist **Pharmacist phone directory can now be found on [email or web address] ([email or web address]).  Listed under [place].

## 2018-01-31 NOTE — ED Triage Notes (Signed)
Per ems pt had LEFT side CP radiating to arm and left jaw. Cp started at 1700.initially 7/10 progressed to 10/10. Received 324asa and 2 nitro en route. Has history of 2 aortic aneurysm 1 has been repaired.

## 2018-02-01 ENCOUNTER — Inpatient Hospital Stay (HOSPITAL_COMMUNITY): Payer: Medicare HMO

## 2018-02-01 ENCOUNTER — Encounter (HOSPITAL_COMMUNITY): Payer: Self-pay | Admitting: Cardiology

## 2018-02-01 DIAGNOSIS — Z720 Tobacco use: Secondary | ICD-10-CM

## 2018-02-01 DIAGNOSIS — Z812 Family history of tobacco abuse and dependence: Secondary | ICD-10-CM

## 2018-02-01 DIAGNOSIS — I2102 ST elevation (STEMI) myocardial infarction involving left anterior descending coronary artery: Secondary | ICD-10-CM

## 2018-02-01 DIAGNOSIS — E785 Hyperlipidemia, unspecified: Secondary | ICD-10-CM

## 2018-02-01 HISTORY — PX: TRANSTHORACIC ECHOCARDIOGRAM: SHX275

## 2018-02-01 LAB — POCT I-STAT, CHEM 8
BUN: 17 mg/dL (ref 8–23)
CHLORIDE: 101 mmol/L (ref 98–111)
Calcium, Ion: 1.28 mmol/L (ref 1.15–1.40)
Creatinine, Ser: 1.3 mg/dL — ABNORMAL HIGH (ref 0.61–1.24)
Glucose, Bld: 191 mg/dL — ABNORMAL HIGH (ref 70–99)
HCT: 42 % (ref 39.0–52.0)
Hemoglobin: 14.3 g/dL (ref 13.0–17.0)
Potassium: 3.1 mmol/L — ABNORMAL LOW (ref 3.5–5.1)
Sodium: 136 mmol/L (ref 135–145)
TCO2: 22 mmol/L (ref 22–32)

## 2018-02-01 LAB — TROPONIN I
TROPONIN I: 44.7 ng/mL — AB (ref <0.03)
TROPONIN I: 49.84 ng/mL — AB (ref <0.03)

## 2018-02-01 LAB — ECHOCARDIOGRAM COMPLETE: HEIGHTINCHES: 72 in

## 2018-02-01 LAB — CBC
HCT: 42 % (ref 39.0–52.0)
Hemoglobin: 14.3 g/dL (ref 13.0–17.0)
MCH: 29.7 pg (ref 26.0–34.0)
MCHC: 34 g/dL (ref 30.0–36.0)
MCV: 87.3 fL (ref 80.0–100.0)
PLATELETS: 223 10*3/uL (ref 150–400)
RBC: 4.81 MIL/uL (ref 4.22–5.81)
RDW: 12.9 % (ref 11.5–15.5)
WBC: 9 10*3/uL (ref 4.0–10.5)
nRBC: 0 % (ref 0.0–0.2)

## 2018-02-01 LAB — BASIC METABOLIC PANEL
Anion gap: 6 (ref 5–15)
BUN: 9 mg/dL (ref 8–23)
CO2: 22 mmol/L (ref 22–32)
CREATININE: 1.07 mg/dL (ref 0.61–1.24)
Calcium: 9.1 mg/dL (ref 8.9–10.3)
Chloride: 108 mmol/L (ref 98–111)
GLUCOSE: 106 mg/dL — AB (ref 70–99)
Potassium: 3.9 mmol/L (ref 3.5–5.1)
Sodium: 136 mmol/L (ref 135–145)

## 2018-02-01 LAB — FERRITIN: FERRITIN: 72 ng/mL (ref 24–336)

## 2018-02-01 LAB — IRON AND TIBC
Iron: 159 ug/dL (ref 45–182)
SATURATION RATIOS: 88 % — AB (ref 17.9–39.5)
TIBC: 181 ug/dL — AB (ref 250–450)
UIBC: 22 ug/dL

## 2018-02-01 LAB — LIPID PANEL
Cholesterol: 194 mg/dL (ref 0–200)
HDL: 34 mg/dL — AB (ref >40)
LDL CALC: 129 mg/dL — AB (ref 0–99)
Total CHOL/HDL Ratio: 5.7 RATIO
Triglycerides: 156 mg/dL — ABNORMAL HIGH (ref <150)
VLDL: 31 mg/dL (ref 0–40)

## 2018-02-01 LAB — POCT ACTIVATED CLOTTING TIME: ACTIVATED CLOTTING TIME: 676 s

## 2018-02-01 LAB — HEMOGLOBIN A1C
Hgb A1c MFr Bld: 4.9 % (ref 4.8–5.6)
Mean Plasma Glucose: 93.93 mg/dL

## 2018-02-01 LAB — HEPARIN LEVEL (UNFRACTIONATED): Heparin Unfractionated: 0.23 IU/mL — ABNORMAL LOW (ref 0.30–0.70)

## 2018-02-01 MED ORDER — PERFLUTREN LIPID MICROSPHERE
INTRAVENOUS | Status: AC
  Filled 2018-02-01: qty 10

## 2018-02-01 MED ORDER — EZETIMIBE 10 MG PO TABS
10.0000 mg | ORAL_TABLET | Freq: Every day | ORAL | Status: DC
  Administered 2018-02-02 – 2018-02-04 (×3): 10 mg via ORAL
  Filled 2018-02-01 (×2): qty 1

## 2018-02-01 MED ORDER — BISOPROLOL FUMARATE 5 MG PO TABS
2.5000 mg | ORAL_TABLET | Freq: Every day | ORAL | Status: DC
  Administered 2018-02-01 – 2018-02-04 (×2): 2.5 mg via ORAL
  Filled 2018-02-01 (×3): qty 1

## 2018-02-01 MED ORDER — TRAMADOL HCL 50 MG PO TABS
50.0000 mg | ORAL_TABLET | Freq: Four times a day (QID) | ORAL | Status: DC | PRN
  Administered 2018-02-01: 50 mg via ORAL
  Filled 2018-02-01: qty 1

## 2018-02-01 NOTE — Care Management Note (Signed)
Case Management Note  Patient Details  Name: Patrick Adams MRN: [id number] Date of Birth: 1945/06/11  Subjective/Objective:  72yo male presented with CP; s/p STEMI mid LAD with stent. PMH: HTN, HLD, hemochromatosis, AAA s/p repair, extensive smoking                  Action/Plan: CM met with patient to discuss transitional needs. Patient lived at home, independent with ADLs PTA with no DME in use. Patient verified PCP as: Clint Bolder PA; [facility]acy of choice[facility] [place]. Brilinta benefits check complete with est monthly cost $47; Brilinta 30-day free card provided and est monthly cost discussed, with patient verbalizing understanding; patient requesting his Rx be filled using the [facility] service. Patient stated an additional cath scheduled on 02/03/18. Patient indicated his sister could assist post transition and family to provide transport home. CM will continue to follow.   Expected Discharge Date:                  Expected Discharge Plan:  Home/Self Care  In-House Referral:  NA  Discharge planning Services  CM Consult, Medication Assistance(Brilinta card; benefits check)  Post Acute Care Choice:  NA Choice offered to:  NA  DME Arranged:  N/A DME Agency:  NA  HH Arranged:  NA HH Agency:  NA  Status of Service:  In process, will continue to follow  If discussed at Long Length of Stay Meetings, dates discussed:    Additional Comments:  Midge Minium RN, BSN, NCM-BC, ACM-RN [phone] 02/01/2018, 3:02 PM

## 2018-02-01 NOTE — Progress Notes (Signed)
Patient complains about chest discomfort but not pain associated with breathing. Tylenol is given. Continue to monitor the patient for any changes.

## 2018-02-01 NOTE — Progress Notes (Signed)
ANTICOAGULATION CONSULT NOTE - [place] for heparin Indication: chest pain/ACS  Allergies  Allergen Reactions  . Statins     Joint pain.    Patient Measurements: Height: 6' (182.9 cm) IBW/kg (Calculated) : 77.6  Wt ~ 80kg   Vital Signs: Temp: 98 F (36.7 C) (11/04 1203) Temp Source: Oral (11/04 1203) BP: 112/68 (11/04 0600) Pulse Rate: 58 (11/04 0600)  Labs: Recent Labs    01/31/18 1900 01/31/18 1916 01/31/18 1928 02/01/18 0239 02/01/18 0908 02/01/18 1252  HGB 15.3  --  14.4  --  14.3  --   HCT 46.2  --  43.8  --  42.0  --   PLT 258  --  259  --  223  --   APTT 32  --  200*  --   --   --   LABPROT 13.2  --  15.0  --   --   --   INR 1.01  --  1.19  --   --   --   HEPARINUNFRC  --   --   --   --   --  0.23*  CREATININE 1.29* 1.27*  --   --  1.07  --   TROPONINI 0.10*  --  0.11* 44.70* 49.84*  --     CrCl cannot be calculated (Unknown ideal weight.).   Medical History: Past Medical History:  Diagnosis Date  . Hemochromatosis   . History of abdominal aortic aneurysm (AAA) repair    With thoracic aortic aneurysm being monitored  . Hypertension     Medications:  Medications Prior to Admission  Medication Sig Dispense Refill Last Dose  . acetaminophen (TYLENOL) 500 MG tablet Take 1,000 mg by mouth every 6 (six) hours as needed for headache.   01/30/2018  . amLODipine (NORVASC) 5 MG tablet Take 5 mg by mouth daily.   01/31/2018  . aspirin EC 81 MG tablet Take 81 mg by mouth at bedtime.   01/30/2018  . cholecalciferol (VITAMIN D) 1000 units tablet Take 1,000 Units by mouth 2 (two) times a week.    Past Month at Unknown time  . ibuprofen (ADVIL,MOTRIN) 200 MG tablet Take 800 mg by mouth every 6 (six) hours as needed for headache or moderate pain.   01/30/2018    Assessment: 72 yo male with STEMI s/p cath with DES placed and possible staged PCI. Pharmacy consulted to restart heparin 8 hrs post sheath (removed 8:18pm, TR band  applied).  Initial heparin level subtherapeutic, no issues with infusion per RN.  Goal of Therapy:  Heparin level 0.3-0.7 units/ml Monitor platelets by anticoagulation protocol: Yes   Plan:  -Increase heparin to 1250 units/hr -Recheck with morning labs  Arrie Senate, PharmD, BCPS Clinical Pharmacist [phone] Please check AMION for all [place] numbers 02/01/2018

## 2018-02-01 NOTE — Plan of Care (Signed)
Patient alert and oriented.  Denies increased pain, 2/10.  Ambulates within room without difficulty.  Heart rate in the 50's. SB on the monitor.  Patient denies dizziness, or nausea.  Will monitor.

## 2018-02-01 NOTE — Progress Notes (Signed)
[id number] RN stated pt walked with staff last night. Pt tired and sleepy now so did not walk with him. MI education began with pt. Discussed the importance of brilinta with stent. Reviewed MI restrictions, gave heart healthy diet, NTG use, and smoking cessation. Did not want fake cigarette. Intends to quit cold [place]. Gave smoking cessation handout and encouraged to call 1800quitnow if needed. Needs to see case manager re brilinta. Will return tomorrow and continue ed.  Graylon Good RN BSN 02/01/2018 11:09 AM

## 2018-02-01 NOTE — Progress Notes (Signed)
  Echocardiogram 2D Echocardiogram has been performed.  Jannett Celestine 02/01/2018, 4:02 PM

## 2018-02-01 NOTE — Progress Notes (Addendum)
Progress Note  Patient Name: Patrick Adams Date of Encounter: 02/01/2018  Primary Cardiologist: harding  Subjective   Patient reports some residual chest soreness today, no overt chest pain.  No dyspnea, PND.  Inpatient Medications    Scheduled Meds: . aspirin  81 mg Oral Daily  . bisoprolol  2.5 mg Oral Daily  . ezetimibe  10 mg Oral Daily  . sodium chloride flush  3 mL Intravenous Q12H  . ticagrelor  90 mg Oral BID   Continuous Infusions: . sodium chloride 10 mL/hr at 01/31/18 1859  . sodium chloride    . heparin 1,100 Units/hr (02/01/18 0600)   PRN Meds: sodium chloride, acetaminophen, ondansetron (ZOFRAN) IV, sodium chloride flush   Vital Signs    Vitals:   02/01/18 0400 02/01/18 0500 02/01/18 0600 02/01/18 0812  BP: 111/75 117/75 112/68   Pulse: (!) 58 (!) 59 (!) 58   Resp: 12 12 13    Temp: 98.2 F (36.8 C)   99 F (37.2 C)  TempSrc: Oral   Oral  SpO2: 98% 98% 98%   Height:        Intake/Output Summary (Last 24 hours) at 02/01/2018 1033 Last data filed at 02/01/2018 0600 Gross per 24 hour  Intake 256.28 ml  Output 700 ml  Net -443.72 ml    I/O since admission:   There were no vitals filed for this visit.  Telemetry    NSR, some sinus brady but rates in upper 50's - Personally Reviewed  ECG    11/3 pre cath ECG (independently read by me): STE anterior/inferior leads,  11/4 ECG (independently read by me): anterolateral T wave inversions today post intervention  Physical Exam    BP 112/68   Pulse (!) 58   Temp 99 F (37.2 C) (Oral)   Resp 13   Ht 6' (1.829 m)   SpO2 98%  General: Alert, oriented, no distress.  Skin: normal turgor, no rashes, warm and dry HEENT: Normocephalic, atraumatic. Pupils equal round and reactive to light; sclera anicteric; extraocular muscles intact;  Mouth/Pharynx benign;  Neck: No JVD, no carotid bruits; normal carotid upstroke Lungs: clear to ausculatation and percussion; no wheezing or rales Chest wall:  without tenderness to palpitation Heart: PMI not displaced, RRR, s1 s2 normal,  no murmur, no rubs, gallops, thrills, or heaves Abdomen: soft, nontender; no hepatosplenomehaly, BS+; abdominal aorta nontender and not dilated by palpation. Back: no CVA tenderness Pulses 2+ Musculoskeletal: full range of motion, normal strength, no joint deformities Extremities: no clubbing cyanosis or edema, Neurologic: grossly nonfocal; Cranial nerves grossly wnl Psychologic: Normal mood and affect     Labs    Chemistry Recent Labs  Lab 01/31/18 1900 01/31/18 1916 02/01/18 0908  NA 134* 133* 136  K 3.8 2.9* 3.9  CL 104 102 108  CO2 18* 21* 22  GLUCOSE 164* 196* 106*  BUN 15 15 9   CREATININE 1.29* 1.27* 1.07  CALCIUM 9.6 9.0 9.1  PROT 6.1* 6.1*  --   ALBUMIN 3.6 3.4*  --   AST 23 19  --   ALT 13 13  --   ALKPHOS 115 106  --   BILITOT 1.5* 1.0  --   GFRNONAA 55* 56* >60  GFRAA >60 >60 >60  ANIONGAP 12 10 6      Hematology Recent Labs  Lab 01/31/18 1900 01/31/18 1928 02/01/18 0908  WBC 7.3 6.1 9.0  RBC 5.24 4.99 4.81  HGB 15.3 14.4 14.3  HCT 46.2 43.8 42.0  MCV 88.2 87.8 87.3  MCH 29.2 28.9 29.7  MCHC 33.1 32.9 34.0  RDW 12.8 12.9 12.9  PLT 258 259 223    Cardiac Enzymes Recent Labs  Lab 01/31/18 1900 01/31/18 1928 02/01/18 0239 02/01/18 0908  TROPONINI 0.10* 0.11* 44.70* 49.84*   No results for input(s): TROPIPOC in the last 168 hours.   BNPNo results for input(s): BNP, PROBNP in the last 168 hours.   DDimer No results for input(s): DDIMER in the last 168 hours.   Lipid Panel     Component Value Date/Time   CHOL 194 02/01/2018 0239   TRIG 156 (H) 02/01/2018 0239   HDL 34 (L) 02/01/2018 0239   CHOLHDL 5.7 02/01/2018 0239   VLDL 31 02/01/2018 0239   LDLCALC 129 (H) 02/01/2018 0239     Radiology    Dg Chest Port 1 View  Result Date: 01/31/2018 CLINICAL DATA:  Chest pain EXAM: PORTABLE CHEST 1 VIEW COMPARISON:  None. FINDINGS: Cardiac shadow is at the  upper limits of normal in size. The lungs are well aerated bilaterally. No focal infiltrate is seen. No bony abnormality is noted. IMPRESSION: No acute abnormality noted. Electronically Signed   By: Inez Catalina M.D.   On: 01/31/2018 21:36    Cardiac Studies   ECHO pending      Post-Intervention Diagram         Patient Profile     72 y.o. male with PMH HTN, HLD, hemochromatosis, AAA s/p repair, extensive smoking history presented evening of 11/3 with 10/10 chest pain radiating to the left arm and jaw.  Found to have elevated troponin and STE of the anterior and inferior leads.  Assessment & Plan    1. S/P STEMI mid LAD: Pt doing well post cath, culprit lesion successfully stented, still some distal occlusion in LAD, RCA with extensive disease as well see above.  ECHO pending  -additional medical therapy start with low dose bisoprolol 1/2 tab today heart rate on the low side, can add low dose losartan later if bp allows.  Pt had poor tolerance to ARB in the past due to dizziness -continue DAPT brillinta and asa, continue heparin for now -cannot tolerate statins he is very insistent of this fact failed crestor, lipitor, pravastatin, simvastatin and not willing to try a trial.  Will start ezetimibe may be candidate for PCSK9 in the future -will take to cath and revisit RCA lesion later this week   2. Hemochromatosis: stable   -will check ferritin, iron TIBC  3. COPD: stable no wheezing on exam, oxygenating well on room air  -using bisoprolol for bb -PRN duonebs  4. Tobacco use: patient decided to quit smoking  -encourage continued cessation on discharge   Signed, Vickki Muff MD PGY-2 Internal Medicine 02/01/2018, 10:33 AM     Patient seen and examined with Dr. Shan Levans. Angios reviewed and discussed treatment strategy with Dr. Ellyn Hack. Agree with assessment and plan.  Day 1 status post anterior STEMI secondary to mid LAD occlusion it successfully with DES stenting.   There continues to be diffuse distal apical LAD disease.  Patient has a large dominant RCA with extensive stenoses in the mid segment and the tightest stenosis in the PLA branch.  Will initiate low-dose beta-blocker today.  Patient had an extensive smoking history and started smoking at age 6.  He has decreased breath sounds with rhonchi.  Will initiate low-dose cardioselective beta-blockade.  Has reported statin intolerance history.  LDL increased to 129.  Will initiate Zetia  but I suspect will need to strongly consider PCSK9 inhibition.  Hydrate post cath.  We will tentatively plan for possible stage PCI to his mid and distal RCA on Wednesday.   Troy Sine, MD, [facility] 02/01/2018 11:07 AM

## 2018-02-01 NOTE — Care Management (Signed)
#    2.  S/W VANESSA  @ [facility] # [phone]  TICAGRELOR: NONE FORMULARY  BRILINTA  90 MG BID COVER- YES CO-PAY- $ 47.00 TIER- 3 DRUG PRIOR APPROVAL- NO  PREFERRED PHARMACY : YES [facility] 90 DAY SUPPL[facility] FOR M/O $ 136.00 90 DAY SUPPLY  FOR  RETAIL $ 141.00

## 2018-02-02 DIAGNOSIS — I255 Ischemic cardiomyopathy: Secondary | ICD-10-CM

## 2018-02-02 DIAGNOSIS — I10 Essential (primary) hypertension: Secondary | ICD-10-CM

## 2018-02-02 LAB — CBC
HCT: 38.5 % — ABNORMAL LOW (ref 39.0–52.0)
Hemoglobin: 13 g/dL (ref 13.0–17.0)
MCH: 29.8 pg (ref 26.0–34.0)
MCHC: 33.8 g/dL (ref 30.0–36.0)
MCV: 88.3 fL (ref 80.0–100.0)
Platelets: 184 10*3/uL (ref 150–400)
RBC: 4.36 MIL/uL (ref 4.22–5.81)
RDW: 13.1 % (ref 11.5–15.5)
WBC: 7.9 10*3/uL (ref 4.0–10.5)
nRBC: 0 % (ref 0.0–0.2)

## 2018-02-02 LAB — BASIC METABOLIC PANEL
Anion gap: 6 (ref 5–15)
BUN: 12 mg/dL (ref 8–23)
CALCIUM: 8.9 mg/dL (ref 8.9–10.3)
CO2: 21 mmol/L — ABNORMAL LOW (ref 22–32)
CREATININE: 1.13 mg/dL (ref 0.61–1.24)
Chloride: 107 mmol/L (ref 98–111)
GFR calc Af Amer: >60 mL/min (ref >60)
GFR calc non Af Amer: >60 mL/min (ref >60)
GLUCOSE: 102 mg/dL — AB (ref 70–99)
Potassium: 3.6 mmol/L (ref 3.5–5.1)
Sodium: 134 mmol/L — ABNORMAL LOW (ref 135–145)

## 2018-02-02 LAB — HEPARIN LEVEL (UNFRACTIONATED): Heparin Unfractionated: 0.33 IU/mL (ref 0.30–0.70)

## 2018-02-02 LAB — APTT: aPTT: 70 seconds — ABNORMAL HIGH (ref 24–36)

## 2018-02-02 LAB — PROTIME-INR
INR: 1.15
Prothrombin Time: 14.6 seconds (ref 11.4–15.2)

## 2018-02-02 MED ORDER — ASPIRIN 81 MG PO CHEW: 81.0000 mg | CHEWABLE_TABLET | ORAL | Status: AC

## 2018-02-02 MED ORDER — SODIUM CHLORIDE 0.9 % IV SOLN
INTRAVENOUS | Status: DC
  Administered 2018-02-03 (×2): via INTRAVENOUS

## 2018-02-02 MED ORDER — SODIUM CHLORIDE 0.9 % IV SOLN: 250.0000 mL | INTRAVENOUS | Status: DC | PRN

## 2018-02-02 MED ORDER — SODIUM CHLORIDE 0.9% FLUSH: 3.0000 mL | Freq: Two times a day (BID) | INTRAVENOUS | Status: DC

## 2018-02-02 MED ORDER — SODIUM CHLORIDE 0.9% FLUSH: 3.0000 mL | INTRAVENOUS | Status: DC | PRN

## 2018-02-02 NOTE — Progress Notes (Addendum)
Progress Note  Patient Name: Patrick Adams Date of Encounter: 02/02/2018  Primary Cardiologist: Dr. Ellyn Hack  Subjective   Patient appears comfortable without chest pain or dyspnea.   Inpatient Medications    Scheduled Meds: . aspirin  81 mg Oral Daily  . bisoprolol  2.5 mg Oral Daily  . ezetimibe  10 mg Oral Daily  . sodium chloride flush  3 mL Intravenous Q12H  . ticagrelor  90 mg Oral BID   Continuous Infusions: . sodium chloride 50 mL/hr at 02/02/18 0800  . sodium chloride    . heparin 1,250 Units/hr (02/02/18 0800)   PRN Meds: sodium chloride, acetaminophen, ondansetron (ZOFRAN) IV, sodium chloride flush, traMADol   Vital Signs    Vitals:   02/01/18 2000 02/02/18 0000 02/02/18 0500 02/02/18 0754  BP: 108/73 95/68 96/67  102/62  Pulse: (!) 54   (!) 56  Resp: 16   18  Temp: 98 F (36.7 C) 98.2 F (36.8 C) 97.8 F (36.6 C) 98.5 F (36.9 C)  TempSrc:    Oral  SpO2: 97%   95%  Height:        Intake/Output Summary (Last 24 hours) at 02/02/2018 0838 Last data filed at 02/02/2018 0800 Gross per 24 hour  Intake 1559.79 ml  Output 700 ml  Net 859.79 ml    I/O since admission: +603  There were no vitals filed for this visit.  Telemetry   Normal sinus rhythm- Personally Reviewed  ECG    ECG (independently read by me): 11/3 ST elevation in inferior lateral leads 11/4 sinus rhythm, T wave inversions  Physical Exam    BP 102/62 (BP Location: Left Arm)   Pulse (!) 56   Temp 98.5 F (36.9 C) (Oral)   Resp 18   Ht 6' (1.829 m)   SpO2 95%   Physical Exam  Constitutional: He appears well-developed and well-nourished. No distress.  HENT:  Head: Normocephalic and atraumatic.  Eyes: Conjunctivae are normal.  Cardiovascular: Normal rate, regular rhythm and normal heart sounds.  No murmur heard. Respiratory: Effort normal and breath sounds normal. No respiratory distress.  Diffuse rhonchi  GI: Soft. Bowel sounds are normal. He exhibits no  distension. There is no tenderness.  Musculoskeletal: He exhibits no edema.  Neurological: He is alert.  Skin: He is not diaphoretic.  Psychiatric: He has a normal mood and affect. His behavior is normal. Judgment and thought content normal.    Labs    Chemistry Recent Labs  Lab 01/31/18 1900 01/31/18 1916 01/31/18 2019 02/01/18 0908  NA 134* 133* 136 136  K 3.8 2.9* 3.1* 3.9  CL 104 102 101 108  CO2 18* 21*  --  22  GLUCOSE 164* 196* 191* 106*  BUN 15 15 17 9   CREATININE 1.29* 1.27* 1.30* 1.07  CALCIUM 9.6 9.0  --  9.1  PROT 6.1* 6.1*  --   --   ALBUMIN 3.6 3.4*  --   --   AST 23 19  --   --   ALT 13 13  --   --   ALKPHOS 115 106  --   --   BILITOT 1.5* 1.0  --   --   GFRNONAA 55* 56*  --  >60  GFRAA >60 >60  --  >60  ANIONGAP 12 10  --  6     Hematology Recent Labs  Lab 01/31/18 1928 01/31/18 2019 02/01/18 0908 02/02/18 0326  WBC 6.1  --  9.0 7.9  RBC 4.99  --  4.81 4.36  HGB 14.4 14.3 14.3 13.0  HCT 43.8 42.0 42.0 38.5*  MCV 87.8  --  87.3 88.3  MCH 28.9  --  29.7 29.8  MCHC 32.9  --  34.0 33.8  RDW 12.9  --  12.9 13.1  PLT 259  --  223 184    Cardiac Enzymes Recent Labs  Lab 01/31/18 1900 01/31/18 1928 02/01/18 0239 02/01/18 0908  TROPONINI 0.10* 0.11* 44.70* 49.84*   No results for input(s): TROPIPOC in the last 168 hours.   BNPNo results for input(s): BNP, PROBNP in the last 168 hours.   DDimer No results for input(s): DDIMER in the last 168 hours.   Lipid Panel     Component Value Date/Time   CHOL 194 02/01/2018 0239   TRIG 156 (H) 02/01/2018 0239   HDL 34 (L) 02/01/2018 0239   CHOLHDL 5.7 02/01/2018 0239   VLDL 31 02/01/2018 0239   LDLCALC 129 (H) 02/01/2018 0239     Radiology    Dg Chest Port 1 View  Result Date: 01/31/2018 CLINICAL DATA:  Chest pain EXAM: PORTABLE CHEST 1 VIEW COMPARISON:  None. FINDINGS: Cardiac shadow is at the upper limits of normal in size. The lungs are well aerated bilaterally. No focal infiltrate is  seen. No bony abnormality is noted. IMPRESSION: No acute abnormality noted. Electronically Signed   By: Inez Catalina M.D.   On: 01/31/2018 21:36    Cardiac Studies   CULPRIT LESION: Prox LAD lesion is 60% stenosed - tapers to Prox LAD to Mid LAD lesion is 100% thrombotic stenosis.  A drug-eluting stent was successfully placed (covering the upstream tapered lesion) using a STENT SYNERGY DES [id number]. -Postdilated in a tapered fashion (4.1-3.6-3.0 mm)  Post intervention, there is a 0% residual stenosis  ---- Residual Disease  Prox RCA to Mid RCA lesion is 55% stenosed. Mid RCA-1 lesion is 85% stenosed. Mid RCA-2 lesion is 40% stenosed. (Extensive segmental disease)  Post Atrio lesion is 90% stenosed. -somewhat ulcerated in appearance.  Ost 1st Mrg lesion is 65% stenosed.  --------------------------------------------------------  There is moderate to severe left ventricular systolic dysfunction. The left ventricular ejection fraction is 35-45% by visual estimate -  LV end diastolic pressure is moderately elevated.   SUMMARY:  Severe two-vessel CAD with 100% prox-midLAD stenosis (at what appeared to be a prior stenotic location) along with mid RCA 80%, RPA V-PL 90%.  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  RECOMMENDATIONS  Admit to CCU for post STEMI PCI care with TR band removal.  With existing disease in the RCA and RPL (in the presence of inferior ST elevations on initial EKG), we will restart IV heparin 8 hours after TR band removal. ? Need to consider the possibility of staged PCI during this hospitalization versus at a later date of the 2  lesions in the RCA-RPAV -depending on patient's symptoms.  If stable with preferably discharged home with plans for outpatient PCI in 2 to 3 weeks.  Check 2D echocardiogram preferably Tuesday to assess EF.  If truly would less than 40 consider the possibility of LifeVest.  Also consider the possibility of Entresto prior to discharge.  Currently blood pressure not high enough to  start beta-blocker or ARB. -Reevaluate in the morning.  Symptomatically euvolemic despite elevated LVEDP.  Will hold off on diuresis for now, but gentle post PCI hydration  Patient has intolerance of statins, will likely need CVRR consultation on discharge.  Consider Zetia while inpatient.  Recommend uninterrupted dual antiplatelet therapy with Aspirin 81mg  daily and Ticagrelor 90mg  twice daily for a minimum of 12 months (ACS - Class I recommendation).  Okay to stop aspirin at 6 months.      Post-Intervention Diagram         Patient Profile     72 y.o. male with history of AAA, hypertensino, hemochromatosis who presented with anterior-inferior STEMI.   Assessment & Plan    Acute anterior inferior STEMI: Two vessel CAD with prox-mid LAD stenosis, mid RCA 80%, RPA V-PL 90% with pci of prox-midLAD and DES placed. EKG (11/4) showing progression and t wave inversion in all leads.   Lipid panel: tcholes=194, LDL 129, triglycerides 156, HDL 34.  She does not tolerate statins and therefore started on Zetia 10 mg daily.  A1c normal range of 4.9.  Continue heparin, aspirin, Brilinta 90 mg, bisoprolol 2.5 mg daily.  Patient stable and will transfer to cardiac telemetry.   Patient scheduled for PCI tomorrow 02/03/2018.  Preop orders placed.  Acute combined systolic and diastolic heart failure  Ischemic Cardiomyopathy Echo showing LVEF 40 to 45%, anterior, anterior septal, apical severe hypokinesis and akinesis.  Aortic valve sclerosis without stenosis.  -Daily weights and I/O  Diffuse rhonchi and increased  secretions Ordered portable chest x-ray to examine for possible pneumonia.  Hemochromatosis Iron = 159, IBC = 181, saturation =88% ferritin = 72.  Patient stable at this time and can consider phlebotomy at a later time if needed.  Tobacco use Patient is started smoking since age 42.  Counseled patient on need for smoking cessation.   Lars Mage, MD Internal Medicine PGY2 Pager:[phone] 02/02/2018, 10:22 AM    Patient seen and examined. Agree with assessment and plan.  No recurrent chest pain, 2 status post anterior MI secondary to mid LAD occlusion.  EF 40 to 45% on echo with severe hypokinesis to involving the anterior anteroseptal and apical wall.  Renal function stable.  No recurrent anginal symptoms.  Peak troponin 49.84.  Okay to transfer out of CCU today to stepdown. Tolerating initiation of low-dose cardioselective beta-blockade with bisoprolol.  Blood pressure 102/62 and heart rate 56, precluding further titration presently.  Plan for staged PCI to the mid and distal RCA tomorrow.  Patient aware of risk benefits of procedure.  He was started on Zetia with reported statin intolerant history.  He may ultimately require PCSK9 inhibition for optimal therapy with target LDL less than 70.   Troy Sine, MD, [facility] 02/02/2018 1:05 PM

## 2018-02-02 NOTE — Progress Notes (Signed)
ANTICOAGULATION CONSULT NOTE - [place] for heparin Indication: chest pain/ACS  Allergies  Allergen Reactions  . Statins     Joint pain.    Patient Measurements: Height: 6' (182.9 cm) IBW/kg (Calculated) : 77.6  Wt ~ 80kg   Vital Signs: Temp: 98.5 F (36.9 C) (11/05 0754) Temp Source: Oral (11/05 0754) BP: 102/62 (11/05 0754) Pulse Rate: 56 (11/05 0754)  Labs: Recent Labs    01/31/18 1900  01/31/18 1928 01/31/18 2019 02/01/18 0239 02/01/18 0908 02/01/18 1252 02/02/18 0326 02/02/18 0855 02/02/18 1104  HGB 15.3  --  14.4 14.3  --  14.3  --  13.0  --   --   HCT 46.2  --  43.8 42.0  --  42.0  --  38.5*  --   --   PLT 258  --  259  --   --  223  --  184  --   --   APTT 32  --  200*  --   --   --   --   --   --  70*  LABPROT 13.2  --  15.0  --   --   --   --   --   --  14.6  INR 1.01  --  1.19  --   --   --   --   --   --  1.15  HEPARINUNFRC  --   --   --   --   --   --  0.23* 0.33  --   --   CREATININE 1.29*   < >  --  1.30*  --  1.07  --   --  1.13  --   TROPONINI 0.10*  --  0.11*  --  44.70* 49.84*  --   --   --   --    < > = values in this interval not displayed.    CrCl cannot be calculated (Unknown ideal weight.).   Medical History: Past Medical History:  Diagnosis Date  . Hemochromatosis   . History of abdominal aortic aneurysm (AAA) repair    With thoracic aortic aneurysm being monitored  . Hypertension     Medications:  Medications Prior to Admission  Medication Sig Dispense Refill Last Dose  . acetaminophen (TYLENOL) 500 MG tablet Take 1,000 mg by mouth every 6 (six) hours as needed for headache.   01/30/2018  . amLODipine (NORVASC) 5 MG tablet Take 5 mg by mouth daily.   01/31/2018  . aspirin EC 81 MG tablet Take 81 mg by mouth at bedtime.   01/30/2018  . cholecalciferol (VITAMIN D) 1000 units tablet Take 1,000 Units by mouth 2 (two) times a week.    Past Month at Unknown time  . ibuprofen (ADVIL,MOTRIN) 200 MG tablet  Take 800 mg by mouth every 6 (six) hours as needed for headache or moderate pain.   01/30/2018    Assessment: 72 yo male with STEMI s/p cath with DES placed and scheduled PCI 11/6. Pharmacy consulted to restart heparin 8 hrs post sheath on 11/3 and continued anticoagulation.  Heparin level therapeutic at 0.33. No bleeding noted. H&H stable.   Goal of Therapy:  Heparin level 0.3-0.7 units/ml Monitor platelets by anticoagulation protocol: Yes   Plan:  -Continue heparin at 1250 units/hr -HL with morning labs -monitor for signs/symtpoms of bleeding  Isaias Sakai, Pharm D PGY1 Pharmacy Resident  Phone [phone] Please use AMION for clinical pharmacists numbers  02/02/2018  12:52 PM

## 2018-02-02 NOTE — H&P (View-Only) (Signed)
Progress Note  Patient Name: Patrick Adams Date of Encounter: 02/02/2018  Primary Cardiologist: Dr. Ellyn Hack  Subjective   Patient appears comfortable without chest pain or dyspnea.   Inpatient Medications    Scheduled Meds: . aspirin  81 mg Oral Daily  . bisoprolol  2.5 mg Oral Daily  . ezetimibe  10 mg Oral Daily  . sodium chloride flush  3 mL Intravenous Q12H  . ticagrelor  90 mg Oral BID   Continuous Infusions: . sodium chloride 50 mL/hr at 02/02/18 0800  . sodium chloride    . heparin 1,250 Units/hr (02/02/18 0800)   PRN Meds: sodium chloride, acetaminophen, ondansetron (ZOFRAN) IV, sodium chloride flush, traMADol   Vital Signs    Vitals:   02/01/18 2000 02/02/18 0000 02/02/18 0500 02/02/18 0754  BP: 108/73 95/68 96/67  102/62  Pulse: (!) 54   (!) 56  Resp: 16   18  Temp: 98 F (36.7 C) 98.2 F (36.8 C) 97.8 F (36.6 C) 98.5 F (36.9 C)  TempSrc:    Oral  SpO2: 97%   95%  Height:        Intake/Output Summary (Last 24 hours) at 02/02/2018 0838 Last data filed at 02/02/2018 0800 Gross per 24 hour  Intake 1559.79 ml  Output 700 ml  Net 859.79 ml    I/O since admission: +603  There were no vitals filed for this visit.  Telemetry   Normal sinus rhythm- Personally Reviewed  ECG    ECG (independently read by me): 11/3 ST elevation in inferior lateral leads 11/4 sinus rhythm, T wave inversions  Physical Exam    BP 102/62 (BP Location: Left Arm)   Pulse (!) 56   Temp 98.5 F (36.9 C) (Oral)   Resp 18   Ht 6' (1.829 m)   SpO2 95%   Physical Exam  Constitutional: He appears well-developed and well-nourished. No distress.  HENT:  Head: Normocephalic and atraumatic.  Eyes: Conjunctivae are normal.  Cardiovascular: Normal rate, regular rhythm and normal heart sounds.  No murmur heard. Respiratory: Effort normal and breath sounds normal. No respiratory distress.  Diffuse rhonchi  GI: Soft. Bowel sounds are normal. He exhibits no  distension. There is no tenderness.  Musculoskeletal: He exhibits no edema.  Neurological: He is alert.  Skin: He is not diaphoretic.  Psychiatric: He has a normal mood and affect. His behavior is normal. Judgment and thought content normal.    Labs    Chemistry Recent Labs  Lab 01/31/18 1900 01/31/18 1916 01/31/18 2019 02/01/18 0908  NA 134* 133* 136 136  K 3.8 2.9* 3.1* 3.9  CL 104 102 101 108  CO2 18* 21*  --  22  GLUCOSE 164* 196* 191* 106*  BUN 15 15 17 9   CREATININE 1.29* 1.27* 1.30* 1.07  CALCIUM 9.6 9.0  --  9.1  PROT 6.1* 6.1*  --   --   ALBUMIN 3.6 3.4*  --   --   AST 23 19  --   --   ALT 13 13  --   --   ALKPHOS 115 106  --   --   BILITOT 1.5* 1.0  --   --   GFRNONAA 55* 56*  --  >60  GFRAA >60 >60  --  >60  ANIONGAP 12 10  --  6     Hematology Recent Labs  Lab 01/31/18 1928 01/31/18 2019 02/01/18 0908 02/02/18 0326  WBC 6.1  --  9.0 7.9  RBC 4.99  --  4.81 4.36  HGB 14.4 14.3 14.3 13.0  HCT 43.8 42.0 42.0 38.5*  MCV 87.8  --  87.3 88.3  MCH 28.9  --  29.7 29.8  MCHC 32.9  --  34.0 33.8  RDW 12.9  --  12.9 13.1  PLT 259  --  223 184    Cardiac Enzymes Recent Labs  Lab 01/31/18 1900 01/31/18 1928 02/01/18 0239 02/01/18 0908  TROPONINI 0.10* 0.11* 44.70* 49.84*   No results for input(s): TROPIPOC in the last 168 hours.   BNPNo results for input(s): BNP, PROBNP in the last 168 hours.   DDimer No results for input(s): DDIMER in the last 168 hours.   Lipid Panel     Component Value Date/Time   CHOL 194 02/01/2018 0239   TRIG 156 (H) 02/01/2018 0239   HDL 34 (L) 02/01/2018 0239   CHOLHDL 5.7 02/01/2018 0239   VLDL 31 02/01/2018 0239   LDLCALC 129 (H) 02/01/2018 0239     Radiology    Dg Chest Port 1 View  Result Date: 01/31/2018 CLINICAL DATA:  Chest pain EXAM: PORTABLE CHEST 1 VIEW COMPARISON:  None. FINDINGS: Cardiac shadow is at the upper limits of normal in size. The lungs are well aerated bilaterally. No focal infiltrate is  seen. No bony abnormality is noted. IMPRESSION: No acute abnormality noted. Electronically Signed   By: Inez Catalina M.D.   On: 01/31/2018 21:36    Cardiac Studies   CULPRIT LESION: Prox LAD lesion is 60% stenosed - tapers to Prox LAD to Mid LAD lesion is 100% thrombotic stenosis.  A drug-eluting stent was successfully placed (covering the upstream tapered lesion) using a STENT SYNERGY DES 3.24M01. -Postdilated in a tapered fashion (4.1-3.6-3.0 mm)  Post intervention, there is a 0% residual stenosis  ---- Residual Disease  Prox RCA to Mid RCA lesion is 55% stenosed. Mid RCA-1 lesion is 85% stenosed. Mid RCA-2 lesion is 40% stenosed. (Extensive segmental disease)  Post Atrio lesion is 90% stenosed. -somewhat ulcerated in appearance.  Ost 1st Mrg lesion is 65% stenosed.  --------------------------------------------------------  There is moderate to severe left ventricular systolic dysfunction. The left ventricular ejection fraction is 35-45% by visual estimate -  LV end diastolic pressure is moderately elevated.   SUMMARY:  Severe two-vessel CAD with 100% prox-midLAD stenosis (at what appeared to be a prior stenotic location) along with mid RCA 80%, RPA V-PL 90%.  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  RECOMMENDATIONS  Admit to CCU for post STEMI PCI care with TR band removal.  With existing disease in the RCA and RPL (in the presence of inferior ST elevations on initial EKG), we will restart IV heparin 8 hours after TR band removal. ? Need to consider the possibility of staged PCI during this hospitalization versus at a later date of the 2  lesions in the RCA-RPAV -depending on patient's symptoms.  If stable with preferably discharged home with plans for outpatient PCI in 2 to 3 weeks.  Check 2D echocardiogram preferably Tuesday to assess EF.  If truly would less than 40 consider the possibility of LifeVest.  Also consider the possibility of Entresto prior to discharge.  Currently blood pressure not high enough to  start beta-blocker or ARB. -Reevaluate in the morning.  Symptomatically euvolemic despite elevated LVEDP.  Will hold off on diuresis for now, but gentle post PCI hydration  Patient has intolerance of statins, will likely need CVRR consultation on discharge.  Consider Zetia while inpatient.  Recommend uninterrupted dual antiplatelet therapy with Aspirin 81mg  daily and Ticagrelor 90mg  twice daily for a minimum of 12 months (ACS - Class I recommendation).  Okay to stop aspirin at 6 months.      Post-Intervention Diagram         Patient Profile     72 y.o. male with history of AAA, hypertensino, hemochromatosis who presented with anterior-inferior STEMI.   Assessment & Plan    Acute anterior inferior STEMI: Two vessel CAD with prox-mid LAD stenosis, mid RCA 80%, RPA V-PL 90% with pci of prox-midLAD and DES placed. EKG (11/4) showing progression and t wave inversion in all leads.   Lipid panel: tcholes=194, LDL 129, triglycerides 156, HDL 34.  She does not tolerate statins and therefore started on Zetia 10 mg daily.  A1c normal range of 4.9.  Continue heparin, aspirin, Brilinta 90 mg, bisoprolol 2.5 mg daily.  Patient stable and will transfer to cardiac telemetry.   Patient scheduled for PCI tomorrow 02/03/2018.  Preop orders placed.  Acute combined systolic and diastolic heart failure  Ischemic Cardiomyopathy Echo showing LVEF 40 to 45%, anterior, anterior septal, apical severe hypokinesis and akinesis.  Aortic valve sclerosis without stenosis.  -Daily weights and I/O  Diffuse rhonchi and increased  secretions Ordered portable chest x-ray to examine for possible pneumonia.  Hemochromatosis Iron = 159, IBC = 181, saturation =88% ferritin = 72.  Patient stable at this time and can consider phlebotomy at a later time if needed.  Tobacco use Patient is started smoking since age 27.  Counseled patient on need for smoking cessation.   Lars Mage, MD Internal Medicine PGY2 [phone] 02/02/2018, 10:22 AM    Patient seen and examined. Agree with assessment and plan.  No recurrent chest pain, 2 status post anterior MI secondary to mid LAD occlusion.  EF 40 to 45% on echo with severe hypokinesis to involving the anterior anteroseptal and apical wall.  Renal function stable.  No recurrent anginal symptoms.  Peak troponin 49.84.  Okay to transfer out of CCU today to stepdown. Tolerating initiation of low-dose cardioselective beta-blockade with bisoprolol.  Blood pressure 102/62 and heart rate 56, precluding further titration presently.  Plan for staged PCI to the mid and distal RCA tomorrow.  Patient aware of risk benefits of procedure.  He was started on Zetia with reported statin intolerant history.  He may ultimately require PCSK9 inhibition for optimal therapy with target LDL less than 70.   Troy Sine, MD, [facility] 02/02/2018 1:05 PM

## 2018-02-02 NOTE — Progress Notes (Signed)
CARDIAC REHAB PHASE I   PRE:  Rate/Rhythm: 53 SB  BP:  Supine: 103/65  Sitting:   Standing:    SaO2: 94%RA  MODE:  Ambulation: 200 ft   POST:  Rate/Rhythm: 70 SR  BP:  Supine:   Sitting: 101/68  Standing:    SaO2: 94%RA 1136-1205 Pt walked 200 ft with steady gait. Would not walk farther. No CP. Pt stated he felt SOB at times. Sats good on RA. Discussed that brilinta can sometimes make one SOB. Discussed CRP 2 and referred to [place]. Did not give ex ed as pt only walked short distance. Will address at a later time.   Graylon Good, RN BSN  02/02/2018 12:01 PM

## 2018-02-03 ENCOUNTER — Encounter (HOSPITAL_COMMUNITY)
Admission: EM | Disposition: A | Discharge status: Home / Self Care | Payer: Self-pay | Source: Home / Self Care | Attending: Cardiology

## 2018-02-03 ENCOUNTER — Encounter (HOSPITAL_COMMUNITY): Payer: Self-pay | Admitting: Interventional Cardiology

## 2018-02-03 DIAGNOSIS — Z716 Tobacco abuse counseling: Secondary | ICD-10-CM

## 2018-02-03 HISTORY — PX: CORONARY STENT INTERVENTION: CATH118234

## 2018-02-03 HISTORY — PX: LEFT HEART CATH AND CORONARY ANGIOGRAPHY: CATH118249

## 2018-02-03 LAB — CBC
HEMATOCRIT: 38.5 % — AB (ref 39.0–52.0)
HEMOGLOBIN: 13.1 g/dL (ref 13.0–17.0)
MCH: 29.8 pg (ref 26.0–34.0)
MCHC: 34 g/dL (ref 30.0–36.0)
MCV: 87.5 fL (ref 80.0–100.0)
Platelets: 178 10*3/uL (ref 150–400)
RBC: 4.4 MIL/uL (ref 4.22–5.81)
RDW: 13.1 % (ref 11.5–15.5)
WBC: 7 10*3/uL (ref 4.0–10.5)
nRBC: 0 % (ref 0.0–0.2)

## 2018-02-03 LAB — BASIC METABOLIC PANEL
Anion gap: 7 (ref 5–15)
BUN: 13 mg/dL (ref 8–23)
CHLORIDE: 108 mmol/L (ref 98–111)
CO2: 21 mmol/L — AB (ref 22–32)
CREATININE: 1.16 mg/dL (ref 0.61–1.24)
Calcium: 8.9 mg/dL (ref 8.9–10.3)
GFR calc Af Amer: >60 mL/min (ref >60)
GFR calc non Af Amer: >60 mL/min (ref >60)
Glucose, Bld: 87 mg/dL (ref 70–99)
POTASSIUM: 3.7 mmol/L (ref 3.5–5.1)
Sodium: 136 mmol/L (ref 135–145)

## 2018-02-03 LAB — POCT ACTIVATED CLOTTING TIME
ACTIVATED CLOTTING TIME: 395 s
ACTIVATED CLOTTING TIME: 312 s

## 2018-02-03 LAB — HEPARIN LEVEL (UNFRACTIONATED): Heparin Unfractionated: 0.37 IU/mL (ref 0.30–0.70)

## 2018-02-03 SURGERY — CORONARY STENT INTERVENTION
Anesthesia: LOCAL

## 2018-02-03 MED ORDER — NITROGLYCERIN 1 MG/10 ML FOR IR/CATH LAB
INTRA_ARTERIAL | Status: DC | PRN
  Administered 2018-02-03: 200 ug via INTRACORONARY

## 2018-02-03 MED ORDER — HEPARIN (PORCINE) IN NACL 1000-0.9 UT/500ML-% IV SOLN
INTRAVENOUS | Status: AC
  Filled 2018-02-03: qty 1000

## 2018-02-03 MED ORDER — HEPARIN SODIUM (PORCINE) 5000 UNIT/ML IJ SOLN
5000.0000 [IU] | Freq: Three times a day (TID) | INTRAMUSCULAR | Status: DC
  Administered 2018-02-03: 5000 [IU] via SUBCUTANEOUS
  Filled 2018-02-03: qty 1

## 2018-02-03 MED ORDER — FENTANYL CITRATE (PF) 100 MCG/2ML IJ SOLN
INTRAMUSCULAR | Status: DC | PRN
  Administered 2018-02-03 (×2): 25 ug via INTRAVENOUS

## 2018-02-03 MED ORDER — TICAGRELOR 90 MG PO TABS: 90.0000 mg | ORAL_TABLET | Freq: Two times a day (BID) | ORAL | Status: DC

## 2018-02-03 MED ORDER — HEPARIN SODIUM (PORCINE) 1000 UNIT/ML IJ SOLN
INTRAMUSCULAR | Status: DC | PRN
  Administered 2018-02-03: 10000 [IU] via INTRAVENOUS

## 2018-02-03 MED ORDER — SODIUM CHLORIDE 0.9% FLUSH: 3.0000 mL | Freq: Two times a day (BID) | INTRAVENOUS | Status: DC

## 2018-02-03 MED ORDER — ASPIRIN 81 MG PO CHEW
81.0000 mg | CHEWABLE_TABLET | Freq: Every day | ORAL | Status: DC
  Administered 2018-02-04: 81 mg via ORAL
  Filled 2018-02-03: qty 1

## 2018-02-03 MED ORDER — IPRATROPIUM-ALBUTEROL 0.5-2.5 (3) MG/3ML IN SOLN: 3.0000 mL | Freq: Two times a day (BID) | RESPIRATORY_TRACT | Status: DC | PRN

## 2018-02-03 MED ORDER — VERAPAMIL HCL 2.5 MG/ML IV SOLN
INTRAVENOUS | Status: AC
  Filled 2018-02-03: qty 2

## 2018-02-03 MED ORDER — NITROGLYCERIN 1 MG/10 ML FOR IR/CATH LAB
INTRA_ARTERIAL | Status: AC
  Filled 2018-02-03: qty 10

## 2018-02-03 MED ORDER — HEPARIN (PORCINE) IN NACL 1000-0.9 UT/500ML-% IV SOLN
INTRAVENOUS | Status: DC | PRN
  Administered 2018-02-03 (×2): 500 mL

## 2018-02-03 MED ORDER — MIDAZOLAM HCL 2 MG/2ML IJ SOLN
INTRAMUSCULAR | Status: AC
  Filled 2018-02-03: qty 2

## 2018-02-03 MED ORDER — LIDOCAINE HCL (PF) 1 % IJ SOLN
INTRAMUSCULAR | Status: DC | PRN
  Administered 2018-02-03: 2 mL

## 2018-02-03 MED ORDER — FENTANYL CITRATE (PF) 100 MCG/2ML IJ SOLN
INTRAMUSCULAR | Status: AC
  Filled 2018-02-03: qty 2

## 2018-02-03 MED ORDER — SODIUM CHLORIDE 0.9 % IV SOLN: 250.0000 mL | INTRAVENOUS | Status: DC | PRN

## 2018-02-03 MED ORDER — LIDOCAINE HCL (PF) 1 % IJ SOLN
INTRAMUSCULAR | Status: AC
  Filled 2018-02-03: qty 30

## 2018-02-03 MED ORDER — SODIUM CHLORIDE 0.9 % IV SOLN: INTRAVENOUS | Status: AC

## 2018-02-03 MED ORDER — TICAGRELOR 90 MG PO TABS
ORAL_TABLET | ORAL | Status: DC | PRN
  Administered 2018-02-03: 90 mg via ORAL

## 2018-02-03 MED ORDER — ONDANSETRON HCL 4 MG/2ML IJ SOLN: 4.0000 mg | Freq: Four times a day (QID) | INTRAMUSCULAR | Status: DC | PRN

## 2018-02-03 MED ORDER — HYDRALAZINE HCL 20 MG/ML IJ SOLN: 5.0000 mg | INTRAMUSCULAR | Status: AC | PRN

## 2018-02-03 MED ORDER — VERAPAMIL HCL 2.5 MG/ML IV SOLN
INTRAVENOUS | Status: DC | PRN
  Administered 2018-02-03: 10 mL via INTRA_ARTERIAL

## 2018-02-03 MED ORDER — SODIUM CHLORIDE 0.9 % IV SOLN: INTRAVENOUS | Status: DC | PRN

## 2018-02-03 MED ORDER — SODIUM CHLORIDE 0.9% FLUSH: 3.0000 mL | INTRAVENOUS | Status: DC | PRN

## 2018-02-03 MED ORDER — IOHEXOL 350 MG/ML SOLN
INTRAVENOUS | Status: DC | PRN
  Administered 2018-02-03: 135 mL via INTRACARDIAC

## 2018-02-03 MED ORDER — ACETAMINOPHEN 325 MG PO TABS
650.0000 mg | ORAL_TABLET | ORAL | Status: DC | PRN
  Administered 2018-02-04: 650 mg via ORAL
  Filled 2018-02-03: qty 2

## 2018-02-03 MED ORDER — MIDAZOLAM HCL 2 MG/2ML IJ SOLN
INTRAMUSCULAR | Status: DC | PRN
  Administered 2018-02-03 (×2): 0.5 mg via INTRAVENOUS
  Administered 2018-02-03: 1 mg via INTRAVENOUS

## 2018-02-03 MED ORDER — LABETALOL HCL 5 MG/ML IV SOLN: 10.0000 mg | INTRAVENOUS | Status: AC | PRN

## 2018-02-03 MED ORDER — TICAGRELOR 90 MG PO TABS
ORAL_TABLET | ORAL | Status: AC
  Filled 2018-02-03: qty 1

## 2018-02-03 MED FILL — Nitroglycerin IV Soln 100 MCG/ML in D5W: INTRA_ARTERIAL | Qty: 10 | Status: AC

## 2018-02-03 MED FILL — Heparin Sodium (Porcine) Inj 1000 Unit/ML: INTRAMUSCULAR | Qty: 30 | Status: AC

## 2018-02-03 MED FILL — Morphine Sulfate Inj 10 MG/ML: INTRAMUSCULAR | Qty: 0.2 | Status: AC

## 2018-02-03 SURGICAL SUPPLY — 22 items
BALLN SAPPHIRE 2.0X10 (BALLOONS) ×2
BALLN SAPPHIRE 2.5X20 (BALLOONS) ×2
BALLN SAPPHIRE ~~LOC~~ 3.0X8 (BALLOONS) ×1 IMPLANT
BALLN SAPPHIRE ~~LOC~~ 4.0X15 (BALLOONS) ×1 IMPLANT
BALLOON SAPPHIRE 2.0X10 (BALLOONS) IMPLANT
BALLOON SAPPHIRE 2.5X20 (BALLOONS) IMPLANT
CATH LAUNCHER 6FR JR4 (CATHETERS) ×1 IMPLANT
DEVICE RAD COMP TR BAND LRG (VASCULAR PRODUCTS) ×1 IMPLANT
GLIDESHEATH SLEND A-KIT 6F 22G (SHEATH) ×1 IMPLANT
GUIDELINER 6F (CATHETERS) ×1 IMPLANT
GUIDEWIRE INQWIRE 1.5J.035X260 (WIRE) IMPLANT
INQWIRE 1.5J .035X260CM (WIRE) ×2
KIT ENCORE 26 ADVANTAGE (KITS) ×1 IMPLANT
KIT HEART LEFT (KITS) ×2 IMPLANT
KIT HEMO VALVE WATCHDOG (MISCELLANEOUS) ×1 IMPLANT
PACK CARDIAC CATHETERIZATION (CUSTOM PROCEDURE TRAY) ×2 IMPLANT
SHEATH PROBE COVER 6X72 (BAG) ×1 IMPLANT
STENT SYNERGY DES 2.5X12 (Permanent Stent) ×1 IMPLANT
STENT SYNERGY DES 3.5X32 (Permanent Stent) ×1 IMPLANT
TRANSDUCER W/STOPCOCK (MISCELLANEOUS) ×2 IMPLANT
TUBING CIL FLEX 10 FLL-RA (TUBING) ×2 IMPLANT
WIRE ASAHI PROWATER 180CM (WIRE) ×1 IMPLANT

## 2018-02-03 NOTE — Progress Notes (Addendum)
Progress Note  Patient Name: Patrick Adams Date of Encounter: 02/03/2018  Primary Cardiologist: harding  Subjective   Patient reports resolution of chest soreness, no dyspnea or PND.    Inpatient Medications    Scheduled Meds: . aspirin  81 mg Oral Daily  . bisoprolol  2.5 mg Oral Daily  . ezetimibe  10 mg Oral Daily  . sodium chloride flush  3 mL Intravenous Q12H  . sodium chloride flush  3 mL Intravenous Q12H  . ticagrelor  90 mg Oral BID   Continuous Infusions: . sodium chloride Stopped (02/03/18 0544)  . sodium chloride    . sodium chloride    . sodium chloride 100 mL/hr at 02/03/18 0700  . heparin 1,250 Units/hr (02/03/18 0700)   PRN Meds: sodium chloride, sodium chloride, acetaminophen, ondansetron (ZOFRAN) IV, sodium chloride flush, sodium chloride flush, traMADol   Vital Signs    Vitals:   02/03/18 0300 02/03/18 0400 02/03/18 0700 02/03/18 0804  BP: 107/77 108/67  (!) 94/56  Pulse: 64 (!) 54 (!) 50 60  Resp: 19 11 15 18   Temp: 97.9 F (36.6 C)   97.8 F (36.6 C)  TempSrc: Oral   Oral  SpO2: 97% 95% 94% 96%  Weight: 80.6 kg     Height:        Intake/Output Summary (Last 24 hours) at 02/03/2018 0828 Last data filed at 02/03/2018 0700 Gross per 24 hour  Intake 1487.81 ml  Output -  Net 1487.81 ml    I/O since admission:   Filed Weights   02/03/18 0300  Weight: 80.6 kg    Telemetry    NSR, occasional sinus brady in the 50's - Personally Reviewed  ECG    11/3 pre cath ECG (independently read by me): STE anterior/inferior leads,  11/4 ECG (independently read by me): anterolateral T wave inversions today post intervention  Physical Exam    BP (!) 94/56 (BP Location: Left Arm)   Pulse 60   Temp 97.8 F (36.6 C) (Oral)   Resp 18   Ht 6' (1.829 m)   Wt 80.6 kg   SpO2 96%   BMI 24.10 kg/m   Cardiac: normal rate and rhythm, clear s1 and s2, no murmurs rubs or gallops Pulmonary: CTAB, not in distress Abdominal: non distended  abdomen, soft and nontender Extremities: no LE edema Psych: Alert, conversant, in good spirits   Labs    Chemistry Recent Labs  Lab 01/31/18 1900 01/31/18 1916  02/01/18 0908 02/02/18 0855 02/03/18 0240  NA 134* 133*   < > 136 134* 136  K 3.8 2.9*   < > 3.9 3.6 3.7  CL 104 102   < > 108 107 108  CO2 18* 21*  --  22 21* 21*  GLUCOSE 164* 196*   < > 106* 102* 87  BUN 15 15   < > 9 12 13   CREATININE 1.29* 1.27*   < > 1.07 1.13 1.16  CALCIUM 9.6 9.0  --  9.1 8.9 8.9  PROT 6.1* 6.1*  --   --   --   --   ALBUMIN 3.6 3.4*  --   --   --   --   AST 23 19  --   --   --   --   ALT 13 13  --   --   --   --   ALKPHOS 115 106  --   --   --   --   BILITOT  1.5* 1.0  --   --   --   --   GFRNONAA 55* 56*  --  >60 >60 >60  GFRAA >60 >60  --  >60 >60 >60  ANIONGAP 12 10  --  6 6 7    < > = values in this interval not displayed.     Hematology Recent Labs  Lab 02/01/18 0908 02/02/18 0326 02/03/18 0240  WBC 9.0 7.9 7.0  RBC 4.81 4.36 4.40  HGB 14.3 13.0 13.1  HCT 42.0 38.5* 38.5*  MCV 87.3 88.3 87.5  MCH 29.7 29.8 29.8  MCHC 34.0 33.8 34.0  RDW 12.9 13.1 13.1  PLT 223 184 178    Cardiac Enzymes Recent Labs  Lab 01/31/18 1900 01/31/18 1928 02/01/18 0239 02/01/18 0908  TROPONINI 0.10* 0.11* 44.70* 49.84*   No results for input(s): TROPIPOC in the last 168 hours.   BNPNo results for input(s): BNP, PROBNP in the last 168 hours.   DDimer No results for input(s): DDIMER in the last 168 hours.   Lipid Panel     Component Value Date/Time   CHOL 194 02/01/2018 0239   TRIG 156 (H) 02/01/2018 0239   HDL 34 (L) 02/01/2018 0239   CHOLHDL 5.7 02/01/2018 0239   VLDL 31 02/01/2018 0239   LDLCALC 129 (H) 02/01/2018 0239     Radiology    No results found.  Cardiac Studies   ECHO pending      Post-Intervention Diagram        LV EF: 40% -   45%  ------------------------------------------------------------------- History:   Risk factors:  Current tobacco use.  Hypertension.  ------------------------------------------------------------------- Study Conclusions  - Left ventricle: The cavity size was normal. Wall thickness was   increased in a pattern of moderate LVH. Systolic function was   mildly to moderately reduced. The estimated ejection fraction was   in the range of 40% to 45%. Anterior, anteroseptal and apical   severe hypokinesis to akinesis. No apical thrombus with Definity   contrast. Doppler parameters are consistent with diastolic   dysfunction and indeterminate LV filling pressure. - Aortic valve: Sclerosis without stenosis. There was trivial   regurgitation. - Mitral valve: Mildly thickened leaflets . There was trivial   regurgitation. - Left atrium: The atrium was normal in size. - Right atrium: The atrium was mildly dilated. - Inferior vena cava: The vessel was normal in size. The   respirophasic diameter changes were in the normal range (>= 50%),   consistent with normal central venous pressure.  Impressions:  - LVEF 40-45%, anterior, anteroseptal and apical severe hypokinesis   to akinesis suggestive of LAD territory ischemia/infarct,   diastolic dysfunction with indeterminate LV filling pressure,   aortic sclerosis with trivial AI, trivial MR, normal LA size,   mild RAE, normal IVC.  Patient Profile     72 y.o. male with PMH HTN, HLD, hemochromatosis, AAA s/p repair, extensive smoking history presented evening of 11/3 with 10/10 chest pain radiating to the left arm and jaw.  Found to have elevated troponin and STE of the anterior and inferior leads.  Assessment & Plan    1. S/P STEMI mid LAD: day 3 post cath, culprit lesion successfully stented, still some distal occlusion in LAD, RCA with extensive disease as well see above.  ECHO LVEF 40-45%, anterior, anteroseptal and apical severe hypokinesis to akinesis suggestive of LAD territory ischemia/infarct, diastolic dysfunction with indeterminate LV filling  pressure  -continue bisoprolol 2.5mg  daily, bp on the soft side and getting  contrast today, will hold off on arb for now -continue DAPT brillinta and asa, continue heparin  -cannot tolerate statins failed crestor, lipitor, pravastatin, simvastatin and not willing to try a trial.  Will continue ezetimibe would be candidate for PCSK9 in the future -will take to cath today and try to intervene on RCA    2. Hemochromatosis: stable   -iron studies look good, NTD  3. COPD: stable no wheezing on exam, oxygenating well on room air  -using bisoprolol for bb -PRN duonebs  4. Tobacco use: patient decided to quit smoking  -encourage continued cessation on discharge   Signed, Vickki Muff MD PGY-2 Internal Medicine 02/03/2018, 8:28 AM     Patient seen and examined. Agree with assessment and plan. Pt is back from PCI I have personally reviewed the angios. Excellent result with DES stent to distal continuation branch of RCA an segmental mid RCA disease.  R Radial site stable. Discussed smoking cessation;  Aim for dc tomorrow.   Troy Sine, MD, [facility] 02/03/2018 2:03 PM

## 2018-02-03 NOTE — CV Procedure (Signed)
   Successful multisite RCA PCI reducing mid tortuous stenosis to 0% and RCA continuation beyond the PDA to 0%.  Mid 32 x 3 5 Synergy and distal 12 x 2.5 Synergy postdilated to 4.0 mm and 3.0 mm respectively.  Closing ACT slightly greater than 300.  Aspirin and Brilinta for 12 months.  Anticipate a.m. discharge.

## 2018-02-03 NOTE — Progress Notes (Signed)
0900 Offered to walk with pt but he declined. Stated he wanted to rest up for procedure. Discussed with pt that we would walk tomorrow and complete ex ed. Graylon Good RN BSN 02/03/2018 9:08 AM

## 2018-02-03 NOTE — Interval H&P Note (Signed)
Cath Lab Visit (complete for each Cath Lab visit)  Clinical Evaluation Leading to the Procedure:    ACS: Yes.    Non-ACS:    Anginal Classification: CCS III  Anti-ischemic medical therapy: Minimal Therapy (1 class of medications)  Non-Invasive Test Results: No non-invasive testing performed  Prior CABG: No previous CABG      History and Physical Interval Note:  02/03/2018 10:00 AM  Patrick Adams  has presented today for surgery, with the diagnosis of cad  The various methods of treatment have been discussed with the patient and family. After consideration of risks, benefits and other options for treatment, the patient has consented to  Procedure(s): CORONARY STENT INTERVENTION (N/A) as a surgical intervention .  The patient's history has been reviewed, patient examined, no change in status, stable for surgery.  I have reviewed the patient's chart and labs.  Questions were answered to the patient's satisfaction.     Belva Crome III

## 2018-02-04 LAB — CBC
HEMATOCRIT: 39.8 % (ref 39.0–52.0)
Hemoglobin: 13.1 g/dL (ref 13.0–17.0)
MCH: 28.7 pg (ref 26.0–34.0)
MCHC: 32.9 g/dL (ref 30.0–36.0)
MCV: 87.3 fL (ref 80.0–100.0)
NRBC: 0 % (ref 0.0–0.2)
PLATELETS: 207 10*3/uL (ref 150–400)
RBC: 4.56 MIL/uL (ref 4.22–5.81)
RDW: 12.8 % (ref 11.5–15.5)
WBC: 7.7 10*3/uL (ref 4.0–10.5)

## 2018-02-04 LAB — BASIC METABOLIC PANEL
ANION GAP: 10 (ref 5–15)
BUN: 13 mg/dL (ref 8–23)
CO2: 19 mmol/L — ABNORMAL LOW (ref 22–32)
Calcium: 8.9 mg/dL (ref 8.9–10.3)
Chloride: 107 mmol/L (ref 98–111)
Creatinine, Ser: 1.18 mg/dL (ref 0.61–1.24)
GFR calc Af Amer: >60 mL/min (ref >60)
GLUCOSE: 70 mg/dL (ref 70–99)
POTASSIUM: 3.3 mmol/L — AB (ref 3.5–5.1)
Sodium: 136 mmol/L (ref 135–145)

## 2018-02-04 MED ORDER — EZETIMIBE 10 MG PO TABS: 10.0000 mg | ORAL_TABLET | Freq: Every day | ORAL | 1 refills | Status: DC

## 2018-02-04 MED ORDER — POTASSIUM CHLORIDE CRYS ER 20 MEQ PO TBCR
40.0000 meq | EXTENDED_RELEASE_TABLET | Freq: Once | ORAL | Status: AC
  Administered 2018-02-04: 40 meq via ORAL
  Filled 2018-02-04: qty 2

## 2018-02-04 MED ORDER — LOSARTAN POTASSIUM 25 MG PO TABS
12.5000 mg | ORAL_TABLET | Freq: Every day | ORAL | Status: DC
  Administered 2018-02-04: 12.5 mg via ORAL
  Filled 2018-02-04: qty 1

## 2018-02-04 MED ORDER — LOSARTAN POTASSIUM 25 MG PO TABS: 12.5000 mg | ORAL_TABLET | Freq: Every day | ORAL | 1 refills | Status: DC

## 2018-02-04 MED ORDER — TICAGRELOR 90 MG PO TABS: 90.0000 mg | ORAL_TABLET | Freq: Two times a day (BID) | ORAL | 2 refills | Status: DC

## 2018-02-04 MED ORDER — BISOPROLOL FUMARATE 5 MG PO TABS: 2.5000 mg | ORAL_TABLET | Freq: Every day | ORAL | 1 refills | Status: DC

## 2018-02-04 MED FILL — BRILINTA 90 MG TABLET: 90 | 30 days supply | Qty: 60 | Fill #0 | Status: TO

## 2018-02-04 MED FILL — EZETIMIBE 10 MG TABS: 10 | 30 days supply | Qty: 30 | Fill #0 | Status: TO

## 2018-02-04 MED FILL — BISOPROLOL FUMARATE 5 MG TA: 5 | 30 days supply | Qty: 15 | Fill #0 | Status: TO

## 2018-02-04 MED FILL — LOSARTAN POTASSIUM 25 MG TA: 25 | 30 days supply | Qty: 15 | Fill #0 | Status: TO

## 2018-02-04 NOTE — Progress Notes (Signed)
CARDIAC REHAB PHASE I   PRE:  Rate/Rhythm: 74 SR  BP:  Supine:   Sitting: 106/68  Standing:    SaO2: 97%RA  MODE:  Ambulation: 450 ft   POST:  Rate/Rhythm: 91 SR  BP:  Supine:   Sitting: 121/83  Standing:    SaO2: 98%RA 2060-1561 Pt walked 450 ft on RA independently. No CP. Has some knee issues that affects walking long distance. Ex ed completed. Reinforced importance of brilinta and smoking cessation. To bed after walk.   Graylon Good, RN BSN  02/04/2018 8:31 AM

## 2018-02-04 NOTE — Care Management Note (Signed)
Case Management Note Previous Note Created by Midge Minium  Patient Details  Name: Patrick Adams MRN: [id number] Date of Birth: [birth year]  Subjective/Objective:  72yo male presented with CP; s/p STEMI mid LAD with stent. PMH: HTN, HLD, hemochromatosis, AAA s/p repair, extensive smoking                  Action/Plan: CM met with patient to discuss transitional needs. Patient lived at home, independent with ADLs PTA with no DME in use. Patient verified PCP as: Clint Bolder PA; Pharmacy of choice: [facility] [place]. Brilinta benefits check complete with est monthly cost $47; Brilinta 30-day free card provided and est monthly cost discussed, with patient verbalizing understanding; patient requesting his Rx be filled using the [facility] service. Patient stated an additional cath scheduled on 02/03/18. Patient indicated his sister could assist post transition and family to provide transport home. CM will continue to follow.   Expected Discharge Date:  02/04/18               Expected Discharge Plan:  Home/Self Care  In-House Referral:  NA  Discharge planning Services  CM Consult, Medication Assistance(Brilinta card; benefits check)  Post Acute Care Choice:  NA Choice offered to:  NA  DME Arranged:  N/A DME Agency:  NA  HH Arranged:  NA HH Agency:  NA  Status of Service:  In process, will continue to follow  If discussed at Long Length of Stay Meetings, dates discussed:    Additional Comments: 02/04/2018 Pt to discharge home today.  TOC will deliver prescription to bedside prior to dsicharge 02/04/2018, 10:32 AM

## 2018-02-04 NOTE — Progress Notes (Addendum)
Progress Note  Patient Name: Patrick Adams Date of Encounter: 02/04/2018  Primary Cardiologist: harding  Subjective   Patient reports feeling well this morning, no cp or dyspnea, eager to get home.     Inpatient Medications    Scheduled Meds: . aspirin  81 mg Oral Daily  . bisoprolol  2.5 mg Oral Daily  . ezetimibe  10 mg Oral Daily  . heparin  5,000 Units Subcutaneous Q8H  . sodium chloride flush  3 mL Intravenous Q12H  . sodium chloride flush  3 mL Intravenous Q12H  . ticagrelor  90 mg Oral BID   Continuous Infusions: . sodium chloride    . sodium chloride     PRN Meds: sodium chloride, sodium chloride, acetaminophen, ipratropium-albuterol, ondansetron (ZOFRAN) IV, sodium chloride flush, sodium chloride flush, traMADol   Vital Signs    Vitals:   02/03/18 1600 02/03/18 2000 02/03/18 2300 02/04/18 0300  BP: 113/69 120/71 113/87 113/75  Pulse: (!) 57 60 81 71  Resp: (!) 9 13 19 20   Temp:  98.2 F (36.8 C) 98 F (36.7 C) 98.5 F (36.9 C)  TempSrc:  Oral Oral Oral  SpO2: 97% 96% 97% 94%  Weight:      Height:        Intake/Output Summary (Last 24 hours) at 02/04/2018 0749 Last data filed at 02/03/2018 1800 Gross per 24 hour  Intake 435.42 ml  Output 450 ml  Net -14.58 ml    I/O since admission:   Filed Weights   02/03/18 0300  Weight: 80.6 kg    Telemetry    NSR, occasional sinus brady in the 50's - Personally Reviewed  ECG    11/3 pre cath ECG (independently read by me): STE anterior/inferior leads,  11/4 ECG (independently read by me): anterolateral T wave inversions today post intervention 11/7 ECG (independently read by me): Q wave evolution in inferior leads, T wave inversions in anterolateral leads largely unchanged  Physical Exam    BP 113/75 (BP Location: Left Arm)   Pulse 71   Temp 98.5 F (36.9 C) (Oral)   Resp 20   Ht 6' (1.829 m)   Wt 80.6 kg   SpO2 94%   BMI 24.10 kg/m   Cardiac: normal rate and rhythm, clear s1 and s2,  no murmurs rubs or gallops Pulmonary: CTAB, not in distress Abdominal: non distended abdomen, soft and nontender Extremities: no LE edema Psych: Alert, conversant, in good spirits   Labs    Chemistry Recent Labs  Lab 01/31/18 1900 01/31/18 1916  02/02/18 0855 02/03/18 0240 02/04/18 0247  NA 134* 133*   < > 134* 136 136  K 3.8 2.9*   < > 3.6 3.7 3.3*  CL 104 102   < > 107 108 107  CO2 18* 21*   < > 21* 21* 19*  GLUCOSE 164* 196*   < > 102* 87 70  BUN 15 15   < > 12 13 13   CREATININE 1.29* 1.27*   < > 1.13 1.16 1.18  CALCIUM 9.6 9.0   < > 8.9 8.9 8.9  PROT 6.1* 6.1*  --   --   --   --   ALBUMIN 3.6 3.4*  --   --   --   --   AST 23 19  --   --   --   --   ALT 13 13  --   --   --   --   ALKPHOS 115 106  --   --   --   --  BILITOT 1.5* 1.0  --   --   --   --   GFRNONAA 55* 56*   < > >60 >60 >60  GFRAA >60 >60   < > >60 >60 >60  ANIONGAP 12 10   < > 6 7 10    < > = values in this interval not displayed.     Hematology Recent Labs  Lab 02/02/18 0326 02/03/18 0240 02/04/18 0247  WBC 7.9 7.0 7.7  RBC 4.36 4.40 4.56  HGB 13.0 13.1 13.1  HCT 38.5* 38.5* 39.8  MCV 88.3 87.5 87.3  MCH 29.8 29.8 28.7  MCHC 33.8 34.0 32.9  RDW 13.1 13.1 12.8  PLT 184 178 207    Cardiac Enzymes Recent Labs  Lab 01/31/18 1900 01/31/18 1928 02/01/18 0239 02/01/18 0908  TROPONINI 0.10* 0.11* 44.70* 49.84*   No results for input(s): TROPIPOC in the last 168 hours.   BNPNo results for input(s): BNP, PROBNP in the last 168 hours.   DDimer No results for input(s): DDIMER in the last 168 hours.   Lipid Panel     Component Value Date/Time   CHOL 194 02/01/2018 0239   TRIG 156 (H) 02/01/2018 0239   HDL 34 (L) 02/01/2018 0239   CHOLHDL 5.7 02/01/2018 0239   VLDL 31 02/01/2018 0239   LDLCALC 129 (H) 02/01/2018 0239     Radiology    No results found.  Cardiac Studies   ECHO pending      Post-Intervention Diagram        Intervention #2:  Post-Intervention Diagram           LV EF: 40% -   45%  ------------------------------------------------------------------- History:   Risk factors:  Current tobacco use. Hypertension.  ------------------------------------------------------------------- Study Conclusions  - Left ventricle: The cavity size was normal. Wall thickness was   increased in a pattern of moderate LVH. Systolic function was   mildly to moderately reduced. The estimated ejection fraction was   in the range of 40% to 45%. Anterior, anteroseptal and apical   severe hypokinesis to akinesis. No apical thrombus with Definity   contrast. Doppler parameters are consistent with diastolic   dysfunction and indeterminate LV filling pressure. - Aortic valve: Sclerosis without stenosis. There was trivial   regurgitation. - Mitral valve: Mildly thickened leaflets . There was trivial   regurgitation. - Left atrium: The atrium was normal in size. - Right atrium: The atrium was mildly dilated. - Inferior vena cava: The vessel was normal in size. The   respirophasic diameter changes were in the normal range (>= 50%),   consistent with normal central venous pressure.  Impressions:  - LVEF 40-45%, anterior, anteroseptal and apical severe hypokinesis   to akinesis suggestive of LAD territory ischemia/infarct,   diastolic dysfunction with indeterminate LV filling pressure,   aortic sclerosis with trivial AI, trivial MR, normal LA size,   mild RAE, normal IVC.  Patient Profile     72 y.o. male with PMH HTN, HLD, hemochromatosis, AAA s/p repair, extensive smoking history presented evening of 11/3 with 10/10 chest pain radiating to the left arm and jaw.  Found to have elevated troponin and STE of the anterior and inferior leads.  Assessment & Plan    1. S/P STEMI mid LAD: day 4 post cath #1 and day 1 post cath #2, culprit lesion successfully stented, still some distal occlusion in LAD, RCA with extensive disease as well see above.  Stents  successfully placed 11/6 in RCA.  ECHO LVEF 40-45%,  anterior, anteroseptal and apical severe hypokinesis to akinesis suggestive of LAD territory ischemia/infarct, diastolic dysfunction with indeterminate LV filling pressure  -continue bisoprolol 2.5mg  daily, can start low dose losartan today -continue DAPT brillinta and asa, d/c heparin -cannot tolerate statins failed crestor, lipitor, pravastatin, simvastatin and not willing to try a trial.  Will continue ezetimibe would be good candidate for PCSK9 in the future   2. Hemochromatosis: stable   -iron studies look good, NTD  3. COPD: stable no wheezing on exam, oxygenating well on room air  -using bisoprolol for bb -PRN duonebs  4. Tobacco use: patient decided to quit smoking  -encourage continued cessation on discharge   Signed, Vickki Muff MD PGY-2 Internal Medicine 02/04/2018, 7:49 AM     Patient seen and examined. Agree with assessment and plan.  Feels well today.  No recurrent chest pain following PCI to mid and distal RCA yesterday.  ECG continued to show evolving anterior T wave changes from initial anterior STEMI and PCI to LAD.  Discussed smoking cessation again and patient is committed to quit.  Will need to continue dap for minimum of 1 year.  Losartan added with mild LV dysfunction.  Continue bisoprolol with potential need for dose titration as outpatient.  Zetia started with history of statin intolerance.  As an outpatient will need to follow lipids closely.  Perhaps consider a rechallenge of Crestor 5 mg weekly and titrate if possible.  Otherwise if target LDL is unable to get less than 70 recommend PCSK9 inhibition.  Okay for discharge today.   Troy Sine, MD, [facility] 02/04/2018 9:29 AM

## 2018-02-04 NOTE — Discharge Summary (Signed)
Discharge Summary    Patient ID: Patrick Adams,  MRN: [id number], DOB/AGE: Apr 29, 1945 72 y.o.  Admit date: 01/31/2018 Discharge date: 02/04/2018  Primary Care Provider: Skeet Latch Primary Cardiologist: Dr. 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 Statins     Joint pain.    Diagnostic Studies/Procedures    Cath: 01/31/18   CULPRIT LESION: Prox LAD lesion is 60% stenosed - tapers to Prox LAD to Mid LAD lesion is 100% thrombotic stenosis.  A drug-eluting stent was successfully placed (covering the upstream tapered lesion) using a STENT SYNERGY DES [id number]. -Postdilated in a tapered fashion (4.1-3.6-3.0 mm)  Post intervention, there is a 0% residual stenosis  ---- Residual Disease  Prox RCA to Mid RCA lesion is 55% stenosed. Mid RCA-1 lesion is 85% stenosed. Mid RCA-2 lesion is 40% stenosed. (Extensive segmental disease)  Post Atrio lesion is 90% stenosed. -somewhat ulcerated in appearance.  Ost 1st Mrg lesion is 65% stenosed.  --------------------------------------------------------  There is moderate to severe left ventricular systolic dysfunction. The left ventricular ejection fraction is 35-45% by visual estimate -  LV end diastolic pressure is moderately elevated.   SUMMARY:  Severe two-vessel CAD with 100% prox-midLAD stenosis (at what appeared to be a prior stenotic  location) along with mid RCA 80%, RPA V-PL 90%.  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  RECOMMENDATIONS  Admit to CCU for post STEMI PCI care with TR band removal.  With existing disease in the RCA and RPL (in the presence of inferior ST elevations on initial EKG), we will restart IV heparin 8 hours after TR band removal. ? Need to consider the possibility of staged PCI during this hospitalization versus at a later date of the 2 lesions in the RCA-RPAV -depending on patient's symptoms.  If stable with preferably discharged home with plans for outpatient PCI in 2 to 3 weeks.  Check 2D echocardiogram preferably Tuesday to assess EF.  If truly would less than 40 consider the possibility of LifeVest.  Also consider the possibility of Entresto prior to discharge.  Currently blood pressure not high enough to start beta-blocker or ARB. -Reevaluate in the morning.  Symptomatically euvolemic despite elevated LVEDP.  Will hold off on diuresis for now, but gentle post PCI hydration  Patient has intolerance of statins, will likely need CVRR consultation on discharge.  Consider Zetia while inpatient.  Recommend uninterrupted dual antiplatelet therapy with Aspirin 81mg  daily and Ticagrelor 90mg  twice daily for a minimum of 12 months (ACS - Class I recommendation).  Okay to stop aspirin at 6 months.  Glenetta Hew, MD  TTE: 02/03/18   Successful multisite PCI on the mid  RCA and continuation of RCA beyond the PDA.  Continuation RCA reduced from 90% with TIMI grade III flow to 0% with TIMI grade III flow using a 2.5 x 12 Synergy postdilated to 3.0 mm in  diameter.  Mid RCA reduced from segmental 70 to 80% with TIMI grade III flow to 0% with TIMI grade III flow using a 32 x 3.5 mm Synergy postdilated to 4.0 mm in diameter.  LVEDP 20 mmHg prior to procedure.  Recommendations:   Dual antiplatelet therapy with aspirin and Brilinta for 12 months given STEMI presentation.  At 6 months it would be appropriate to consider switching to Plavix from Brilinta.  Aggressive risk factor modification.  Anticipate discharge in a.m.  Recommend uninterrupted dual antiplatelet therapy with Aspirin 81mg  daily and Ticagrelor 90mg  twice daily for a minimum of 12 months (ACS - Class I recommendation). _____________   History of Present Illness     Patrick Adams with a hx of HTN and hemochromatosis who was doing well until roughly 5-5 30 the evening of admission when he started having 5-7 out of 10 chest pain.  It then began to be close to 8/9 out of 10 chest pain he called EMS.  Upon arrival he was found to have anterior elevations in V2-V6 as well as inferior elevations in ruminal 2, 3 and aVF.  Code STEMI was called he was brought in to the most [facility] emergency room where he was stabilized awaiting Cath Lab team arrival.  He was then brought emergently to the cardiac catheterization lab for emergent catheterization.  EMS gave him 2 sodium nitro and 324 mg of aspirin with no resolution of chest pain.  In the ER he received 4000 units of IV heparin.  Hospital Course     1. S/P STEMI mid LAD:  culprit lesion successfully stented I the mLAD, still some distal occlusion in LAD, RCA with extensive disease as well see above.  Stents successfully placed 11/6 in RCA.  ECHO LVEF 40-45%, anterior, anteroseptal and apical severe hypokinesis to akinesis suggestive of LAD territory ischemia/infarct, diastolic dysfunction with indeterminate LV filling pressure. -continue bisoprolol 2.5mg  daily,  and losartan 12.5mg  daily -continue DAPT brillinta and asa -cannot tolerate  statins failed crestor, lipitor, pravastatin, simvastatin and not willing to try a trial.  Will continue ezetimibe would be good candidate for PCSK9 in the future  2. Hemochromatosis: stable  - iron studies look good  3. COPD: stable no wheezing on exam, oxygenating well on room air -using bisoprolol for bb  4. Tobacco use: patient decided to quit smoking  Patrick Adams was seen by Dr. Claiborne Billings and determined stable for discharge home. Follow up in the office has been arranged. Medications are listed below.   _____________  Discharge Vitals Blood pressure 106/68, pulse 71, temperature 98.9 F (37.2 C), temperature source Oral, resp. rate 20, height 6' (1.829 m), weight 80.6 kg, SpO2 94 %.  Filed Weights   02/03/18 0300  Weight: 80.6 kg    Labs & Radiologic Studies    CBC Recent Labs    02/03/18 0240 02/04/18 0247  WBC 7.0 7.7  HGB 13.1 13.1  HCT 38.5* 39.8  MCV 87.5 87.3  PLT 178 326   Basic Metabolic Panel Recent Labs    02/03/18 0240 02/04/18 0247  NA 136 136  K 3.7 3.3*  CL 108 107  CO2 21* 19*  GLUCOSE 87 70  BUN 13 13  CREATININE 1.16 1.18  CALCIUM 8.9 8.9   Liver  Function Tests No results for input(s): AST, ALT, ALKPHOS, BILITOT, PROT, ALBUMIN in the last 72 hours. No results for input(s): LIPASE, AMYLASE in the last 72 hours. Cardiac Enzymes No results for input(s): CKTOTAL, CKMB, CKMBINDEX, TROPONINI in the last 72 hours. BNP Invalid input(s): POCBNP D-Dimer No results for input(s): DDIMER in the last 72 hours. Hemoglobin A1C No results for input(s): HGBA1C in the last 72 hours. Fasting Lipid Panel No results for input(s): CHOL, HDL, LDLCALC, TRIG, CHOLHDL, LDLDIRECT in the last 72 hours. Thyroid Function Tests No results for input(s): TSH, T4TOTAL, T3FREE, THYROIDAB in the last 72 hours.  Invalid input(s): FREET3 _____________  Dg Chest Port 1 View  Result Date: 01/31/2018 CLINICAL DATA:  Chest pain EXAM: PORTABLE CHEST 1 VIEW  COMPARISON:  None. FINDINGS: Cardiac shadow is at the upper limits of normal in size. The lungs are well aerated bilaterally. No focal infiltrate is seen. No bony abnormality is noted. IMPRESSION: No acute abnormality noted. Electronically Signed   By: Inez Catalina M.D.   On: 01/31/2018 21:36   Disposition   Pt is being discharged home today in good condition.  Follow-up Plans & Appointments    Follow-up Information    Lendon Colonel, NP Follow up on 02/15/2018.   Specialties:  Nurse Practitioner, Radiology, Cardiology Why:  at [place] for your follow up appt.  Contact information: [address] [place] [address] [phone]          Discharge Instructions    AMB Referral to Cardiac Rehabilitation - Phase II   Complete by:  As directed    Diagnosis:   STEMI Coronary Stents     Amb Referral to Cardiac Rehabilitation   Complete by:  As directed    Diagnosis:   STEMI Coronary Stents     Call MD for:  redness, tenderness, or signs of infection (pain, swelling, redness, odor or green/yellow discharge around incision site)   Complete by:  As directed    Diet - low sodium heart healthy   Complete by:  As directed    Discharge instructions   Complete by:  As directed    Radial Site Care Refer to this sheet in the next few weeks. These instructions provide you with information on caring for yourself after your procedure. Your caregiver may also give you more specific instructions. Your treatment has been planned according to current medical practices, but problems sometimes occur. Call your caregiver if you have any problems or questions after your procedure. HOME CARE INSTRUCTIONS You may shower the day after the procedure.Remove the bandage (dressing) and gently wash the site with plain soap and water.Gently pat the site dry.  Do not apply powder or lotion to the site.  Do not submerge the affected site in water for 3 to 5 days.  Inspect the site at least  twice daily.  Do not flex or bend the affected arm for 24 hours.  No lifting over 5 pounds (2.3 kg) for 5 days after your procedure.  Do not drive home if you are discharged the same day of the procedure. Have someone else drive you.  You may drive 24 hours after the procedure unless otherwise instructed by your caregiver.  What to expect: Any bruising will usually fade within 1 to 2 weeks.  Blood that collects in the tissue (hematoma) may be painful to the touch. It should usually decrease in size and tenderness within 1 to 2 weeks.  SEEK IMMEDIATE MEDICAL CARE IF: You  have unusual pain at the radial site.  You have redness, warmth, swelling, or pain at the radial site.  You have drainage (other than a small amount of blood on the dressing).  You have chills.  You have a fever or persistent symptoms for more than 72 hours.  You have a fever and your symptoms suddenly get worse.  Your arm becomes pale, cool, tingly, or numb.  You have heavy bleeding from the site. Hold pressure on the site.   PLEASE DO NOT MISS ANY DOSES OF YOUR BRILINTA!!!!! Also keep a log of you blood pressures and bring back to your follow up appt. Please call the office with any questions.   Patients taking blood thinners should generally stay away from medicines like ibuprofen, Advil, Motrin, naproxen, and Aleve due to risk of stomach bleeding. You may take Tylenol as directed or talk to your primary doctor about alternatives.   Increase activity slowly   Complete by:  As directed        Discharge Medications     Medication List    STOP taking these medications   amLODipine 5 MG tablet Commonly known as:  NORVASC   ibuprofen 200 MG tablet Commonly known as:  ADVIL,MOTRIN     TAKE these medications   acetaminophen 500 MG tablet Commonly known as:  TYLENOL Take 1,000 mg by mouth every 6 (six) hours as needed for headache.   aspirin EC 81 MG tablet Take 81 mg by mouth at bedtime.   bisoprolol 5 MG  tablet Commonly known as:  ZEBETA Take 0.5 tablets (2.5 mg total) by mouth daily.   cholecalciferol 1000 units tablet Commonly known as:  VITAMIN D Take 1,000 Units by mouth 2 (two) times a week.   ezetimibe 10 MG tablet Commonly known as:  ZETIA Take 1 tablet (10 mg total) by mouth daily.   losartan 25 MG tablet Commonly known as:  COZAAR Take 0.5 tablets (12.5 mg total) by mouth daily.   ticagrelor 90 MG Tabs tablet Commonly known as:  BRILINTA Take 1 tablet (90 mg total) by mouth 2 (two) times daily.        Acute coronary syndrome (MI, NSTEMI, STEMI, etc) this admission?: Yes.     AHA/ACC Clinical Performance & Quality Measures: 1. Aspirin prescribed? - Yes 2. ADP Receptor Inhibitor (Plavix/Clopidogrel, Brilinta/Ticagrelor or Effient/Prasugrel) prescribed (includes medically managed patients)? - Yes 3. Beta Blocker prescribed? - Yes 4. High Intensity Statin (Lipitor 40-80mg  or Crestor 20-40mg ) prescribed? - Yes 5. EF assessed during THIS hospitalization? - Yes 6. For EF <40%, was ACEI/ARB prescribed? - Not Applicable (EF >/= 11%) 7. For EF <40%, Aldosterone Antagonist (Spironolactone or Eplerenone) prescribed? - Not Applicable (EF >/= 91%) 8. Cardiac Rehab Phase II ordered (Included Medically managed Patients)? - Yes      Outstanding Labs/Studies   BMET at follow up appt.   Duration of Discharge Encounter   Greater than 30 minutes including physician time.  Signed, Reino Bellis NP-C 02/04/2018, 9:42 AM

## 2018-02-05 ENCOUNTER — Telehealth (HOSPITAL_COMMUNITY): Payer: Self-pay

## 2018-02-05 NOTE — Telephone Encounter (Signed)
Called patient to see if he is interested in the Cardiac Rehab Program. Patient stated not at this time.  Closed referral

## 2018-02-05 NOTE — Telephone Encounter (Signed)
Pt insurance is active and benefits verified through [facility]. Co-pay $35.00, DED $0.00/$0.00 met, out of pocket $5,900.00/$275.00 met, co-insurance 0%. No pre-authorization. Passport, 02/05/18 @ 9:51AM, REF# [id number]  Will contact patient to see if he is interested in the Cardiac Rehab Program. If interested, patient will need to complete follow up appt. Once completed, patient will be contacted for scheduling upon review by the RN Navigator.

## 2018-02-14 NOTE — Progress Notes (Signed)
Cardiology Office Note   Date:  02/15/2018   ID:  Patrick Adams, DOB 12-04-1945, MRN [id number]  PCP:  [name], MD  Cardiologist: Dr. Ellyn Hack Chief Complaint  Patient presents with  . Shortness of Breath  . Hospitalization Follow-up     History of Present Illness: Patrick Adams is a 72 y.o. male who presents for post discharge follow up after admission for ACS with Code STEMI requiring cardiac cath with PCI of the mid LAD due to thrombotic stenosis. This was followed by staged intervention of the mid RCA with DES placement. He was found to have reduced EF 35%-45%. He was placed on DAPT but is intolerant of statins. Consideration for PCSK9 inhibitors will be discussed.   He comes today with multiple questions concerning his cardiac cath, prognosis, life style changes and medication explanation. He is having dyspnea after taking Brilinta. He states he is feeling foggy in his head. He also is concerned about whether he can take Viagra now.   Past Medical History:  Diagnosis Date  . Hemochromatosis   . History of abdominal aortic aneurysm (AAA) repair    With thoracic aortic aneurysm being monitored  . Hypertension     Past Surgical History:  Procedure Laterality Date  . ABDOMINAL AORTIC ANEURYSM REPAIR    . CORONARY STENT INTERVENTION N/A 02/03/2018   Procedure: CORONARY STENT INTERVENTION;  Surgeon: Belva Crome, MD;  Location: [place] [facility];  Service: Cardiovascular;  Laterality: N/A;  . CORONARY/GRAFT ACUTE MI REVASCULARIZATION N/A 01/31/2018   Procedure: Coronary/Graft Acute MI Revascularization;  Surgeon: Leonie Man, MD;  Location: [place] [facility];  Service: Cardiovascular;  Laterality: N/A;  . LEFT HEART CATH AND CORONARY ANGIOGRAPHY N/A 01/31/2018   Procedure: LEFT HEART CATH AND CORONARY ANGIOGRAPHY;  Surgeon: Leonie Man, MD;  Location: [place] [facility];  Service: Cardiovascular;  Laterality: N/A;  . LEFT HEART CATH AND CORONARY ANGIOGRAPHY  N/A 02/03/2018   Procedure: LEFT HEART CATH AND CORONARY ANGIOGRAPHY;  Surgeon: Belva Crome, MD;  Location: [place] [facility];  Service: Cardiovascular;  Laterality: N/A;     Current Outpatient Medications  Medication Sig Dispense Refill  . acetaminophen (TYLENOL) 500 MG tablet Take 1,000 mg by mouth every 6 (six) hours as needed for headache.    Marland Kitchen aspirin EC 81 MG tablet Take 81 mg by mouth at bedtime.    . bisoprolol (ZEBETA) 5 MG tablet Take 0.5 tablets (2.5 mg total) by mouth daily. 30 tablet 1  . ezetimibe (ZETIA) 10 MG tablet Take 1 tablet (10 mg total) by mouth daily. 30 tablet 11  . losartan (COZAAR) 25 MG tablet Take 0.5 tablets (12.5 mg total) by mouth daily. 30 tablet 11  . ticagrelor (BRILINTA) 90 MG TABS tablet Take 1 tablet (90 mg total) by mouth 2 (two) times daily. 180 tablet 2   No current facility-administered medications for this visit.    Facility-Administered Medications Ordered in Other Visits  Medication Dose Route Frequency Provider Last Rate Last Dose  . 0.9 %  sodium chloride infusion    Continuous PRN Leonie Man, MD 10 mL/hr at 01/31/18 1912 10 mL/hr at 01/31/18 1912  . 0.9 %  sodium chloride infusion    Continuous PRN Leonie Man, MD 100 mL/hr at 01/31/18 1912    . fentaNYL (SUBLIMAZE) injection    PRN Leonie Man, MD   25 mcg at 01/31/18 1933  . iohexol (OMNIPAQUE) 350 MG/ML injection  PRN Leonie Man, MD   165 mL at 01/31/18 2019  . lidocaine (PF) (XYLOCAINE) 1 % injection    PRN Leonie Man, MD   2 mL at 01/31/18 1914  . midazolam (VERSED) injection    PRN Leonie Man, MD   1 mg at 01/31/18 1933  . morphine 2 MG/ML injection    PRN Leonie Man, MD   2 mg at 01/31/18 2030  . morphine 2 MG/ML injection    PRN Leonie Man, MD   2 mg at 01/31/18 2017    Allergies:   Statins    Social History:  The patient  reports that he has been smoking. He has never used smokeless tobacco. He reports that he does not drink  alcohol or use drugs.   Family History:  The patient's family history is not on file.    ROS: All other systems are reviewed and negative. Unless otherwise mentioned in H&P    PHYSICAL EXAM: VS:  BP 122/68   Pulse (!) 48   Ht 6' (1.829 m)   Wt 176 lb 12.8 oz (80.2 kg)   BMI 23.98 kg/m  , BMI Body mass index is 23.98 kg/m. GEN: Well nourished, well developed, in no acute distress HEENT: normal Neck: no JVD, carotid bruits, or masses Cardiac: RRR bradycardic; no murmurs, rubs, or gallops,no edema  Respiratory:  Clear to auscultation bilaterally, normal work of breathing GI: soft, nontender, nondistended, + BS MS: no deformity or atrophy Skin: warm and dry, no rash Neuro:  Strength and sensation are intact Psych: euthymic mood, full affect   EKG:  Sinus bradycardia, rate of 48 bpm.   Recent Labs: 01/31/2018: ALT 13 02/04/2018: BUN 13; Creatinine, Ser 1.18; Hemoglobin 13.1; Platelets 207; Potassium 3.3; Sodium 136    Lipid Panel    Component Value Date/Time   CHOL 194 02/01/2018 0239   TRIG 156 (H) 02/01/2018 0239   HDL 34 (L) 02/01/2018 0239   CHOLHDL 5.7 02/01/2018 0239   VLDL 31 02/01/2018 0239   LDLCALC 129 (H) 02/01/2018 0239      Wt Readings from Last 3 Encounters:  02/15/18 176 lb 12.8 oz (80.2 kg)  02/03/18 177 lb 11.1 oz (80.6 kg)      Other studies Reviewed: Cardiac Cath 01/31/2018  CULPRIT LESION: Prox LAD lesion is 60% stenosed - tapers to Prox LAD to Mid LAD lesion is 100% thrombotic stenosis.  A drug-eluting stent was successfully placed (covering the upstream tapered lesion) using a STENT SYNERGY DES [id number]. -Postdilated in a tapered fashion (4.1-3.6-3.0 mm)  Post intervention, there is a 0% residual stenosis  ---- Residual Disease  Prox RCA to Mid RCA lesion is 55% stenosed. Mid RCA-1 lesion is 85% stenosed. Mid RCA-2 lesion is 40% stenosed. (Extensive segmental disease)  Post Atrio lesion is 90% stenosed. -somewhat ulcerated in  appearance.  Ost 1st Mrg lesion is 65% stenosed.  --------------------------------------------------------  There is moderate to severe left ventricular systolic dysfunction. The left ventricular ejection fraction is 35-45% by visual estimate -  LV end diastolic pressure is moderately elevated.   SUMMARY:  Severe two-vessel CAD with 100% prox-midLAD stenosis (at what appeared to be a prior stenotic location) along with mid RCA 80%, RPA V-PL 90%.  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  Cardiac Cath 02/03/2018 Conclusion    Successful multisite PCI on the mid RCA and continuation of RCA beyond the PDA.  Continuation RCA reduced from 90% with TIMI grade III flow to 0% with TIMI grade III flow using a 2.5 x 12 Synergy postdilated to 3.0 mm in diameter.  Mid RCA reduced from segmental 70 to 80% with TIMI grade III flow to 0% with TIMI grade III flow using a 32 x 3.5 mm Synergy postdilated to 4.0 mm in diameter.  LVEDP 20 mmHg prior to procedure.  Recommendations:   Dual antiplatelet therapy with aspirin and Brilinta for 12 months given STEMI presentation.  At 6 months it would be appropriate to consider switching to Plavix from Brilinta.  Aggressive risk factor modification.  Anticipate discharge in a.m.  Recommend uninterrupted dual antiplatelet therapy with Aspirin 81mg  daily and Ticagrelor 90mg  twice daily for a minimum of 12 months (ACS - Class I recommendation).     ASSESSMENT AND PLAN:  1. CAD: S/P STEMI of the LAD. This required emergent PCI to the mid LAD, and was followed by a staged intervention to the mid RCA with DES. He  remains on DAPT. I have recommended a small amount of caffeine after taking Brilinta to see if this is helpful. If not will consider changing to Effient.   2.Bradycardia with fatigue: He is on bisoprolol 2.5 mg daily. I will stop this due to bradycardia and foggy fatigue symptoms. If he has angina symptoms may consider restarting.   3. Hypercholesterolemia: He has been tried on statins in the past to include atorvastatin and simvastatin causing significant myalgia pain. Will refer to Pharm D for evaluation an institution of PCSK-9 inhibitor therapy.   4. AAA (Thoracic and Abdominal): Followed by Dr. Rebecca Eaton at [facility] after AAA repair. Has annual CT scans.   Will send this note to him for his records.    Current medicines are reviewed at length with the patient today.    Labs/ tests ordered today include: None  Phill Myron. [place] Pugh, ANP, AACC   02/15/2018 9:54 AM    [address] Office [phone] Fax [phone]

## 2018-02-15 ENCOUNTER — Ambulatory Visit: Payer: Medicare HMO | Admitting: Adult Health

## 2018-02-15 ENCOUNTER — Encounter: Payer: Self-pay | Admitting: Adult Health

## 2018-02-15 VITALS — BP 122/68 | HR 48 | Ht 72.0 in | Wt 176.8 lb

## 2018-02-15 DIAGNOSIS — I2510 Atherosclerotic heart disease of native coronary artery without angina pectoris: Secondary | ICD-10-CM | POA: Diagnosis not present

## 2018-02-15 DIAGNOSIS — E7800 Pure hypercholesterolemia, unspecified: Secondary | ICD-10-CM | POA: Diagnosis not present

## 2018-02-15 DIAGNOSIS — I714 Abdominal aortic aneurysm, without rupture, unspecified: Secondary | ICD-10-CM

## 2018-02-15 DIAGNOSIS — I712 Thoracic aortic aneurysm, without rupture, unspecified: Secondary | ICD-10-CM

## 2018-02-15 DIAGNOSIS — I10 Essential (primary) hypertension: Secondary | ICD-10-CM

## 2018-02-15 DIAGNOSIS — Z9861 Coronary angioplasty status: Secondary | ICD-10-CM

## 2018-02-15 MED ORDER — EZETIMIBE 10 MG PO TABS: 10.0000 mg | ORAL_TABLET | Freq: Every day | ORAL | 11 refills | Status: DC

## 2018-02-15 MED ORDER — LOSARTAN POTASSIUM 25 MG PO TABS: 12.5000 mg | ORAL_TABLET | Freq: Every day | ORAL | 11 refills | Status: DC

## 2018-02-15 NOTE — Patient Instructions (Signed)
Medication Instructions:  HOLD BISOPROLOL   If you need a refill on your cardiac medications before your next appointment, please call your pharmacy.  Labwork: If you have labs (blood work) drawn today and your tests are completely normal, you will receive your results only by: Marland Kitchen MyChart Message (if you have MyChart) OR . A paper copy in the mail If you have any lab test that is abnormal or we need to change your treatment, we will call you to review the results.  Special Instructions: CLEARED FOR CARDIAC REHAB  Please call [facility] network for Physician referral at: [phone] -or- Visit [email or web address]  Follow-Up: You will need a follow up appointment WITH  PHARMACIST TO [place] will need a follow up appointment in 1 MONTH.  You may see  DR Cecil Cranker, DNP, AACC or one of the following Advanced Practice Providers on your designated Care Team:    . Jory Sims, DNP, ANP . Rosaria Ferries, PA-C  At [facility], you and your health needs are our priority.  As part of our continuing mission to provide you with exceptional heart care, we have created designated Provider Care Teams.  These Care Teams include your primary Cardiologist (physician) and Advanced Practice Providers (APPs -  Physician Assistants and Nurse Practitioners) who all work together to provide you with the care you need, when you need it.  Thank you for choosing [facility] at [facility]!!

## 2018-02-22 ENCOUNTER — Telehealth: Payer: Self-pay | Admitting: Adult Health

## 2018-02-22 MED ORDER — LOSARTAN POTASSIUM 25 MG PO TABS: 25.0000 mg | ORAL_TABLET | Freq: Every day | ORAL | Status: DC

## 2018-02-22 NOTE — Telephone Encounter (Signed)
Returned pt call. Pt sts that he was up at midnight last night and didn't feel well so he decide to check his BP. Pt sts that it was 151/92 67bpm. Pt sts his BP this morning was 135/86 66bpm. Pt sts that he is currently asymptomatic. Pt sts that he usually up at midnight. Adv pt to continue to monitor his BP 2-3 hours after taking his morning medications, and at midnight. Adv pt to contact the office if his BP is consistently elevated (>140/90). Pt agreeable and verbalized understanding.

## 2018-02-22 NOTE — Telephone Encounter (Signed)
Pt aware of Dr.Harding's recommendation pt will increase Losartan to 25mg  daily and continue to monitor his BP. Adv pt to contact the office if his BP continues to remain elevated. Pt verbalized understanding and voiced appreciation for the call.

## 2018-02-22 NOTE — Telephone Encounter (Signed)
So for the most part the blood pressure reading this morning was okay.  One last night was a little bit elevated but not to the point where child would be concerned about. I think we can probably just have him increase his losartan dose to a full tablet.  Glenetta Hew, MD

## 2018-02-22 NOTE — Telephone Encounter (Signed)
New Message:    Patient calling he has some question that he would like to ask. Please call patient.

## 2018-02-22 NOTE — Addendum Note (Signed)
Addended by: Lamar Laundry on: 02/22/2018 03:44 PM   Modules accepted: Orders

## 2018-02-23 ENCOUNTER — Ambulatory Visit (INDEPENDENT_AMBULATORY_CARE_PROVIDER_SITE_OTHER): Payer: Medicare HMO | Admitting: Pharmacist Clinician (PhC)/ Clinical Pharmacy Specialist

## 2018-02-23 DIAGNOSIS — E785 Hyperlipidemia, unspecified: Secondary | ICD-10-CM | POA: Diagnosis not present

## 2018-02-23 MED ORDER — ROSUVASTATIN CALCIUM 5 MG PO TABS: ORAL_TABLET | ORAL | 3 refills | Status: DC

## 2018-02-23 NOTE — Patient Instructions (Addendum)
Start rosuvastatin 5 mg three times per week.  If you do well on this between now and Christmas, then increase the dose to 1 tablet every other day.    If you have any problems with this please call me.  Kristin/Raquel at [phone].   HOW TO TAKE YOUR BLOOD PRESSURE: . Rest 5 minutes before taking your blood pressure. .  Don't smoke or drink caffeinated beverages for at least 30 minutes before. . Take your blood pressure before (not after) you eat. . Sit comfortably with your back supported and both feet on the floor (don't cross your legs). . Elevate your arm to heart level on a table or a desk. . Use the proper sized cuff. It should fit smoothly and snugly around your bare upper arm. There should be enough room to slip a fingertip under the cuff. The bottom edge of the cuff should be 1 inch above the crease of the elbow. . Ideally, take 3 measurements at one sitting and record the average.

## 2018-02-23 NOTE — Assessment & Plan Note (Signed)
Patient post STEMI, in office to discuss options for cholesterol management.  Patient is willing to re-challenge with a statin.  We also discussed the option of PCSK-9 inhibitors, but he is leery of needles and would like to avoid this if possible.  We also discussed research option and the FDA approval of bempedoic acid in just a few months.  He would like to first re-challenge with statins, so will start him on rosuvastatin 5 mg three times weekly.  If he does well with this, he is to increase slightly to every other day around Christmas.  If at any time he develops myalgias/joint pain, he is to call [facility] and let us know.  Will repeat labs in early February and see him shortly after that for follow up.

## 2018-02-23 NOTE — Progress Notes (Signed)
02/23/2018 Patrick Adams 1945/09/30 [id number]   HPI:  Patrick Adams is a 72 y.o. male patient of Patrick Adams, who presents today for a lipid clinic evaluation.  He was seen by Patrick Sims NP on Nov 18 as a TOC post ACS with code STEMI.  HE had a PCI to mid [facility] and DES to mid RCA.   He was also noted to have an EF of 35-45%.  In addition to his history is significant for hypertension, hereditary hemochromatosis and BPH.    He recalls having previously taken a statin drug, although he is unsure of which one.  After several weeks he had severe joint pain and the medication was discontinued.  He was given a second statin, but as there was no wash out time between the two, he only took the second medication for 2-3 days before discontinuing.  This was many years ago.    Today he is in the office to discuss potential cholesterol lowering options.    Current Medications:  Ezetimibe 10 mg daily  Cholesterol Goals:   LDL < 70  Intolerant/previously tried:  2 different statins, back to back, caused joint pains, unsure of which ones  Family history:   Father died at 54, hemochromatosis; hypertension, hyperlipidemia (TG close to 1000, TC as high)  Mother had some heart disease, died this year at 36  1 sister healthy  1 daughter - no disease yet  Diet:   Eats out most days - mostly sit down restaurants , admits to plenty of greasy, fried foods; not so much sweets, drinks sweet tea or water  Exercise:    No cardiac rehab, no regular exercise yet   Labs:   01/2018:  TC 194, TG 156, HDL 34, LDL 129 (in hospital at time of STEMI)  Current Outpatient Medications  Medication Sig Dispense Refill  . acetaminophen (TYLENOL) 500 MG tablet Take 1,000 mg by mouth every 6 (six) hours as needed for headache.    Marland Kitchen aspirin EC 81 MG tablet Take 81 mg by mouth at bedtime.    Marland Kitchen ezetimibe (ZETIA) 10 MG tablet Take 1 tablet (10 mg total) by mouth daily. 30 tablet 11  . losartan (COZAAR) 25 MG tablet  Take 1 tablet (25 mg total) by mouth daily.    . ticagrelor (BRILINTA) 90 MG TABS tablet Take 1 tablet (90 mg total) by mouth 2 (two) times daily. 180 tablet 2  . rosuvastatin (CRESTOR) 5 MG tablet Take 1 tablet by mouth every other day or as directed 45 tablet 3   No current facility-administered medications for this visit.    Facility-Administered Medications Ordered in Other Visits  Medication Dose Route Frequency Provider Last Rate Last Dose  . 0.9 %  sodium chloride infusion    Continuous PRN Patrick Man, MD 10 mL/hr at 01/31/18 1912 10 mL/hr at 01/31/18 1912  . 0.9 %  sodium chloride infusion    Continuous PRN Patrick Man, MD 100 mL/hr at 01/31/18 1912    . fentaNYL (SUBLIMAZE) injection    PRN Patrick Man, MD   25 mcg at 01/31/18 1933  . iohexol (OMNIPAQUE) 350 MG/ML injection    PRN Patrick Man, MD   165 mL at 01/31/18 2019  . lidocaine (PF) (XYLOCAINE) 1 % injection    PRN Patrick Man, MD   2 mL at 01/31/18 1914  . midazolam (VERSED) injection    PRN Patrick Man, MD   1 mg at  01/31/18 1933  . morphine 2 MG/ML injection    PRN Patrick Man, MD   2 mg at 01/31/18 2030  . morphine 2 MG/ML injection    PRN Patrick Man, MD   2 mg at 01/31/18 2017    Allergies  Allergen Reactions  . Statins     Joint pain.    Past Medical History:  Diagnosis Date  . Hemochromatosis   . History of abdominal aortic aneurysm (AAA) repair    With thoracic aortic aneurysm being monitored  . Hypertension     Blood pressure 124/66.   Hyperlipidemia with target LDL less than 70 Patient post STEMI, in office to discuss options for cholesterol management.  Patient is willing to re-challenge with a statin.  We also discussed the option of PCSK-9 inhibitors, but he is leery of needles and would like to avoid this if possible.  We also discussed research option and the FDA approval of bempedoic acid in just a few months.  He would like to first re-challenge with  statins, so will start him on rosuvastatin 5 mg three times weekly.  If he does well with this, he is to increase slightly to every other day around Christmas.  If at any time he develops myalgias/joint pain, he is to call [facility] and let us know.  Will repeat labs in early February and see him shortly after that for follow up.   Patrick Adams PharmD CPP [facility]

## 2018-03-17 ENCOUNTER — Telehealth: Payer: Self-pay | Admitting: Cardiology

## 2018-03-17 NOTE — Telephone Encounter (Signed)
Patient c/o that around 1:00 pm today he had chest pressure & tightness and an aching pain in his left arm rated 8/10, lasting 45 minutes. Also said he was a little nauseas but did not vomit. Chest pressure has resolved but left arm still feels achy rated 2/10. No c/o dizziness or sob. Patient has an appointment in the morning 03/18/18 @10 :30 am with [facility]. Advised patient that if his symptoms return or get worse, to go to the ED for an evaluation. Verbalized understanding of plan.

## 2018-03-17 NOTE — Progress Notes (Signed)
Cardiology Office Note   Date:  03/18/2018   ID:  Patrick Adams, DOB 03-01-1946, MRN [id number]  PCP:  [name], MD  Cardiologist: Dr.  Ellyn Hack Chief Complaint  Patient presents with  . Coronary Artery Disease  . Chest Pain  . Hyperlipidemia     History of Present Illness: Patrick Adams is a 72 y.o. male who presents for ongoing assessment and management of CAD with PCI of the mid LAD due to thrombotic stenosis 01/31/2018., followed by a staged intervention of the mid RCA with DES placement on 02/03/2018, on DAPT. Other hx includes HL and has been seen by Pharm D for consideration of PCSK9 due to statin intolerance.   He decided to have a re-challenge of statin therapy at the time he met with Pharm D, and was started on rosuvastatin 5 mg 3 times a week. Possible up titration if he tolerates this dose. He is intolerant of statin with reduced dose and has stopped it completely. His being considered for study drug. He is only on Zetia.   Labs are to be repeated in February 2020.  A research option and the FDA approval of bempedoic acid in just a few months was also discussed.   He called our office on 03/18/2018 with complaints of chest pressure and tightness with aching pain in his left arm radiating to his jaw, 8/10 lasting 45 minutes.He felt nauseated but did not vomit.  After chest pressure resolved left arm pain continued. The pain was exactly like the pain he experienced while having MI one month ago. Laid down in his bed and pain resolved. He did not have NTG.   He unfortunately continues to smoke despite counseling and had been smoking prior to his chest/jaw/arm pain.   Past Medical History:  Diagnosis Date  . Hemochromatosis   . History of abdominal aortic aneurysm (AAA) repair    With thoracic aortic aneurysm being monitored  . Hypertension     Past Surgical History:  Procedure Laterality Date  . ABDOMINAL AORTIC ANEURYSM REPAIR    . CORONARY STENT INTERVENTION  N/A 02/03/2018   Procedure: CORONARY STENT INTERVENTION;  Surgeon: Belva Crome, MD;  Location: [facility];  Service: Cardiovascular;  Laterality: N/A;  . CORONARY/GRAFT ACUTE MI REVASCULARIZATION N/A 01/31/2018   Procedure: Coronary/Graft Acute MI Revascularization;  Surgeon: Leonie Man, MD;  Location: [facility];  Service: Cardiovascular;  Laterality: N/A;  . LEFT HEART CATH AND CORONARY ANGIOGRAPHY N/A 01/31/2018   Procedure: LEFT HEART CATH AND CORONARY ANGIOGRAPHY;  Surgeon: Leonie Man, MD;  Location: [facility];  Service: Cardiovascular;  Laterality: N/A;  . LEFT HEART CATH AND CORONARY ANGIOGRAPHY N/A 02/03/2018   Procedure: LEFT HEART CATH AND CORONARY ANGIOGRAPHY;  Surgeon: Belva Crome, MD;  Location: [facility];  Service: Cardiovascular;  Laterality: N/A;     Current Outpatient Medications  Medication Sig Dispense Refill  . acetaminophen (TYLENOL) 500 MG tablet Take 1,000 mg by mouth every 6 (six) hours as needed for headache.    Marland Kitchen aspirin EC 81 MG tablet Take 81 mg by mouth at bedtime.    Marland Kitchen ezetimibe (ZETIA) 10 MG tablet Take 1 tablet (10 mg total) by mouth daily. 30 tablet 11  . losartan (COZAAR) 25 MG tablet Take 1 tablet (25 mg total) by mouth daily.    . ticagrelor (BRILINTA) 90 MG TABS tablet Take 1 tablet (90 mg total) by mouth 2 (two) times daily.  180 tablet 2  . Pitavastatin Calcium (LIVALO) 4 MG TABS Take 1 tablet (4 mg total) by mouth daily. 30 tablet    No current facility-administered medications for this visit.    Facility-Administered Medications Ordered in Other Visits  Medication Dose Route Frequency Provider Last Rate Last Dose  . 0.9 %  sodium chloride infusion    Continuous PRN Leonie Man, MD 10 mL/hr at 01/31/18 1912 10 mL/hr at 01/31/18 1912  . 0.9 %  sodium chloride infusion    Continuous PRN Leonie Man, MD 100 mL/hr at 01/31/18 1912    . fentaNYL (SUBLIMAZE) injection    PRN Leonie Man, MD   25  mcg at 01/31/18 1933  . iohexol (OMNIPAQUE) 350 MG/ML injection    PRN Leonie Man, MD   165 mL at 01/31/18 2019  . lidocaine (PF) (XYLOCAINE) 1 % injection    PRN Leonie Man, MD   2 mL at 01/31/18 1914  . midazolam (VERSED) injection    PRN Leonie Man, MD   1 mg at 01/31/18 1933  . morphine 2 MG/ML injection    PRN Leonie Man, MD   2 mg at 01/31/18 2030  . morphine 2 MG/ML injection    PRN Leonie Man, MD   2 mg at 01/31/18 2017    Allergies:   Statins    Social History:  The patient  reports that he has been smoking. He has never used smokeless tobacco. He reports that he does not drink alcohol or use drugs.   Family History:  The patient's family history is not on file.    ROS: All other systems are reviewed and negative. Unless otherwise mentioned in H&P    PHYSICAL EXAM: VS:  BP 108/60   Pulse (!) 59   Ht 6' (1.829 m)   Wt 176 lb 9.6 oz (80.1 kg)   SpO2 96%   BMI 23.95 kg/m  , BMI Body mass index is 23.95 kg/m. GEN: Well nourished, well developed, in no acute distress HEENT: normal Neck: no JVD, carotid bruits, or masses Cardiac: RRR; no murmurs, rubs, or gallops,no edema  Respiratory:  Clear to auscultation bilaterally, normal work of breathing GI: soft, nontender, nondistended, + BS MS: no deformity or atrophy Skin: warm and dry, no rash Neuro:  Strength and sensation are intact Psych: euthymic mood, full affect   EKG:  Sinus rhythm with occasional PVC's inferior infarct with antero/lateral ischemia. Rate of 70 bpm. Unchanged from prior EKG.  Recent Labs: 01/31/2018: ALT 13 02/04/2018: BUN 13; Creatinine, Ser 1.18; Hemoglobin 13.1; Platelets 207; Potassium 3.3; Sodium 136    Lipid Panel    Component Value Date/Time   CHOL 194 02/01/2018 0239   TRIG 156 (H) 02/01/2018 0239   HDL 34 (L) 02/01/2018 0239   CHOLHDL 5.7 02/01/2018 0239   VLDL 31 02/01/2018 0239   LDLCALC 129 (H) 02/01/2018 0239      Wt Readings from Last 3  Encounters:  03/18/18 176 lb 9.6 oz (80.1 kg)  02/15/18 176 lb 12.8 oz (80.2 kg)  02/03/18 177 lb 11.1 oz (80.6 kg)      Other studies Reviewed: Cardiac Cath 01/31/2018  Severe two-vessel CAD with 100% prox-midLAD stenosis (at what appeared to be a prior stenotic location) along with mid RCA 80%, RPA V-PL 90%.  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  Staged PCI 02/03/2018 Conclusion    Successful multisite PCI on the mid RCA and continuation of RCA beyond the PDA.  Continuation RCA reduced from 90% with TIMI grade III flow to 0% with TIMI grade III flow using a 2.5 x 12 Synergy postdilated to 3.0 mm in diameter.  Mid RCA reduced from segmental 70 to 80% with TIMI grade III flow to 0% with TIMI grade III flow using a 32 x 3.5 mm Synergy postdilated to 4.0 mm in diameter.  LVEDP 20 mmHg prior to procedure.    ASSESSMENT AND PLAN:  1. Unstable Angina: Severe chest discomfort with left arm pain and jaw pain, similar to pain he experienced with prior MI one month ago. I have discussed this with Dr. Percival Spanish who is DOD in the office today. He recommends that he have a re-look cath to evaluate for in-stent restenosis. He will be given SL NTG to take until cath is completed.   The patient understands that risks include but are not limited to stroke (1 in 1000), death (1 in 31), kidney failure [usually temporary] (1 in 500), bleeding (1 in 200), allergic reaction [possibly serious] (1 in 200), and agrees to proceed.   2. CAD: PCI of the mid LAD due to thrombotic stenosis 01/31/2018., followed by a staged intervention of the mid RCA with DES placement  on 02/03/2018, on DAPT.Cardiac cath scheduled for Jan 17, 2018 with Dr. Martinique at 3 pm.  3. Hypercholesterolemia: He is intolerant to statins. Has seen Pharm D and discussed being in study drug program. He has not been started on PCSK9 at this time. I have given him samples of Lovalo 4 mg to take BID until he is placed on more lipid lowering agents.   4. Ongoing tobacco abuse: I have strongly counseled him on cessation, especially in light of recurrent chest pain and known CAD. He verbalizes understanding.   Counseling > 3-5 minutes.  5. Thoracic Aortic Aneurysm:  Follow up CT annually   Current medicines are reviewed at length with the patient today.    Labs/ tests ordered today include: Cardiac cath with cors and possible.  Patrick Adams, ANP, AACC   03/18/2018 11:54 AM    [place] [address] Office [phone] Fax [phone]

## 2018-03-17 NOTE — Telephone Encounter (Signed)
Thank you, Alma Friendly

## 2018-03-17 NOTE — Telephone Encounter (Signed)
New message    Pt c/o of Chest Pain: STAT if CP now or developed within 24 hours  1. Are you having CP right now?  Yes   2. Are you experiencing any other symptoms (ex. SOB, nausea, vomiting, sweating)? No pain down his left arm   3. How long have you been experiencing CP? 45 minutes   4. Is your CP continuous or coming and going? It was steady  , but it is starting to ease , but the pain in the arm is still there   5. Have you taken Nitroglycerin? No does not have any ?

## 2018-03-17 NOTE — H&P (View-Only) (Signed)
Cardiology Office Note   Date:  03/18/2018   ID:  Patrick Adams, DOB May 25, 1945, MRN [id number]  PCP:  [name], MD  Cardiologist: Dr.  Ellyn Hack Chief Complaint  Patient presents with  . Coronary Artery Disease  . Chest Pain  . Hyperlipidemia     History of Present Illness: Patrick Adams is a 72 y.o. male who presents for ongoing assessment and management of CAD with PCI of the mid LAD due to thrombotic stenosis 01/31/2018., followed by a staged intervention of the mid RCA with DES placement on 02/03/2018, on DAPT. Other hx includes HL and has been seen by Pharm D for consideration of PCSK9 due to statin intolerance.   He decided to have a re-challenge of statin therapy at the time he met with Pharm D, and was started on rosuvastatin 5 mg 3 times a week. Possible up titration if he tolerates this dose. He is intolerant of statin with reduced dose and has stopped it completely. His being considered for study drug. He is only on Zetia.   Labs are to be repeated in February 2020.  A research option and the FDA approval of bempedoic acid in just a few months was also discussed.   He called our office on 03/18/2018 with complaints of chest pressure and tightness with aching pain in his left arm radiating to his jaw, 8/10 lasting 45 minutes.He felt nauseated but did not vomit.  After chest pressure resolved left arm pain continued. The pain was exactly like the pain he experienced while having MI one month ago. Laid down in his bed and pain resolved. He did not have NTG.   He unfortunately continues to smoke despite counseling and had been smoking prior to his chest/jaw/arm pain.   Past Medical History:  Diagnosis Date  . Hemochromatosis   . History of abdominal aortic aneurysm (AAA) repair    With thoracic aortic aneurysm being monitored  . Hypertension     Past Surgical History:  Procedure Laterality Date  . ABDOMINAL AORTIC ANEURYSM REPAIR    . CORONARY STENT INTERVENTION  N/A 02/03/2018   Procedure: CORONARY STENT INTERVENTION;  Surgeon: Belva Crome, MD;  Location: [facility];  Service: Cardiovascular;  Laterality: N/A;  . CORONARY/GRAFT ACUTE MI REVASCULARIZATION N/A 01/31/2018   Procedure: Coronary/Graft Acute MI Revascularization;  Surgeon: Leonie Man, MD;  Location: [facility];  Service: Cardiovascular;  Laterality: N/A;  . LEFT HEART CATH AND CORONARY ANGIOGRAPHY N/A 01/31/2018   Procedure: LEFT HEART CATH AND CORONARY ANGIOGRAPHY;  Surgeon: Leonie Man, MD;  Location: [facility];  Service: Cardiovascular;  Laterality: N/A;  . LEFT HEART CATH AND CORONARY ANGIOGRAPHY N/A 02/03/2018   Procedure: LEFT HEART CATH AND CORONARY ANGIOGRAPHY;  Surgeon: Belva Crome, MD;  Location: [facility];  Service: Cardiovascular;  Laterality: N/A;     Current Outpatient Medications  Medication Sig Dispense Refill  . acetaminophen (TYLENOL) 500 MG tablet Take 1,000 mg by mouth every 6 (six) hours as needed for headache.    Marland Kitchen aspirin EC 81 MG tablet Take 81 mg by mouth at bedtime.    Marland Kitchen ezetimibe (ZETIA) 10 MG tablet Take 1 tablet (10 mg total) by mouth daily. 30 tablet 11  . losartan (COZAAR) 25 MG tablet Take 1 tablet (25 mg total) by mouth daily.    . ticagrelor (BRILINTA) 90 MG TABS tablet Take 1 tablet (90 mg total) by mouth 2 (two) times daily.  180 tablet 2  . Pitavastatin Calcium (LIVALO) 4 MG TABS Take 1 tablet (4 mg total) by mouth daily. 30 tablet    No current facility-administered medications for this visit.    Facility-Administered Medications Ordered in Other Visits  Medication Dose Route Frequency Provider Last Rate Last Dose  . 0.9 %  sodium chloride infusion    Continuous PRN Leonie Man, MD 10 mL/hr at 01/31/18 1912 10 mL/hr at 01/31/18 1912  . 0.9 %  sodium chloride infusion    Continuous PRN Leonie Man, MD 100 mL/hr at 01/31/18 1912    . fentaNYL (SUBLIMAZE) injection    PRN Leonie Man, MD   25  mcg at 01/31/18 1933  . iohexol (OMNIPAQUE) 350 MG/ML injection    PRN Leonie Man, MD   165 mL at 01/31/18 2019  . lidocaine (PF) (XYLOCAINE) 1 % injection    PRN Leonie Man, MD   2 mL at 01/31/18 1914  . midazolam (VERSED) injection    PRN Leonie Man, MD   1 mg at 01/31/18 1933  . morphine 2 MG/ML injection    PRN Leonie Man, MD   2 mg at 01/31/18 2030  . morphine 2 MG/ML injection    PRN Leonie Man, MD   2 mg at 01/31/18 2017    Allergies:   Statins    Social History:  The patient  reports that he has been smoking. He has never used smokeless tobacco. He reports that he does not drink alcohol or use drugs.   Family History:  The patient's family history is not on file.    ROS: All other systems are reviewed and negative. Unless otherwise mentioned in H&P    PHYSICAL EXAM: VS:  BP 108/60   Pulse (!) 59   Ht 6' (1.829 m)   Wt 176 lb 9.6 oz (80.1 kg)   SpO2 96%   BMI 23.95 kg/m  , BMI Body mass index is 23.95 kg/m. GEN: Well nourished, well developed, in no acute distress HEENT: normal Neck: no JVD, carotid bruits, or masses Cardiac: RRR; no murmurs, rubs, or gallops,no edema  Respiratory:  Clear to auscultation bilaterally, normal work of breathing GI: soft, nontender, nondistended, + BS MS: no deformity or atrophy Skin: warm and dry, no rash Neuro:  Strength and sensation are intact Psych: euthymic mood, full affect   EKG:  Sinus rhythm with occasional PVC's inferior infarct with antero/lateral ischemia. Rate of 70 bpm. Unchanged from prior EKG.  Recent Labs: 01/31/2018: ALT 13 02/04/2018: BUN 13; Creatinine, Ser 1.18; Hemoglobin 13.1; Platelets 207; Potassium 3.3; Sodium 136    Lipid Panel    Component Value Date/Time   CHOL 194 02/01/2018 0239   TRIG 156 (H) 02/01/2018 0239   HDL 34 (L) 02/01/2018 0239   CHOLHDL 5.7 02/01/2018 0239   VLDL 31 02/01/2018 0239   LDLCALC 129 (H) 02/01/2018 0239      Wt Readings from Last 3  Encounters:  03/18/18 176 lb 9.6 oz (80.1 kg)  02/15/18 176 lb 12.8 oz (80.2 kg)  02/03/18 177 lb 11.1 oz (80.6 kg)      Other studies Reviewed: Cardiac Cath 01/31/2018  Severe two-vessel CAD with 100% prox-midLAD stenosis (at what appeared to be a prior stenotic location) along with mid RCA 80%, RPA V-PL 90%.  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  Staged PCI 02/03/2018 Conclusion    Successful multisite PCI on the mid RCA and continuation of RCA beyond the PDA.  Continuation RCA reduced from 90% with TIMI grade III flow to 0% with TIMI grade III flow using a 2.5 x 12 Synergy postdilated to 3.0 mm in diameter.  Mid RCA reduced from segmental 70 to 80% with TIMI grade III flow to 0% with TIMI grade III flow using a 32 x 3.5 mm Synergy postdilated to 4.0 mm in diameter.  LVEDP 20 mmHg prior to procedure.    ASSESSMENT AND PLAN:  1. Unstable Angina: Severe chest discomfort with left arm pain and jaw pain, similar to pain he experienced with prior MI one month ago. I have discussed this with Dr. Percival Spanish who is DOD in the office today. He recommends that he have a re-look cath to evaluate for in-stent restenosis. He will be given SL NTG to take until cath is completed.   The patient understands that risks include but are not limited to stroke (1 in 1000), death (1 in 57), kidney failure [usually temporary] (1 in 500), bleeding (1 in 200), allergic reaction [possibly serious] (1 in 200), and agrees to proceed.   2. CAD: PCI of the mid LAD due to thrombotic stenosis 01/31/2018., followed by a staged intervention of the mid RCA with DES placement  on 02/03/2018, on DAPT.Cardiac cath scheduled for Jan 17, 2018 with Dr. Martinique at 3 pm.  3. Hypercholesterolemia: He is intolerant to statins. Has seen Pharm D and discussed being in study drug program. He has not been started on PCSK9 at this time. I have given him samples of Lovalo 4 mg to take BID until he is placed on more lipid lowering agents.   4. Ongoing tobacco abuse: I have strongly counseled him on cessation, especially in light of recurrent chest pain and known CAD. He verbalizes understanding.   Counseling > 3-5 minutes.  5. Thoracic Aortic Aneurysm:  Follow up CT annually   Current medicines are reviewed at length with the patient today.    Labs/ tests ordered today include: Cardiac cath with cors and possible.  Phill Myron. West Pugh, ANP, AACC   03/18/2018 11:54 AM    [place] [address] Office [phone] Fax [phone]

## 2018-03-18 ENCOUNTER — Encounter: Payer: Self-pay | Admitting: Adult Health

## 2018-03-18 ENCOUNTER — Ambulatory Visit: Payer: Medicare HMO | Admitting: Adult Health

## 2018-03-18 ENCOUNTER — Other Ambulatory Visit: Payer: Self-pay | Admitting: Adult Health

## 2018-03-18 ENCOUNTER — Other Ambulatory Visit: Payer: Self-pay | Admitting: Cardiology

## 2018-03-18 VITALS — BP 108/60 | HR 59 | Ht 72.0 in | Wt 176.6 lb

## 2018-03-18 DIAGNOSIS — I200 Unstable angina: Secondary | ICD-10-CM | POA: Diagnosis not present

## 2018-03-18 DIAGNOSIS — I2510 Atherosclerotic heart disease of native coronary artery without angina pectoris: Secondary | ICD-10-CM

## 2018-03-18 DIAGNOSIS — E7800 Pure hypercholesterolemia, unspecified: Secondary | ICD-10-CM

## 2018-03-18 DIAGNOSIS — Z720 Tobacco use: Secondary | ICD-10-CM | POA: Diagnosis not present

## 2018-03-18 LAB — CBC
Hematocrit: 44.5 % (ref 37.5–51.0)
Hemoglobin: 15.5 g/dL (ref 13.0–17.7)
MCH: 30.6 pg (ref 26.6–33.0)
MCHC: 34.8 g/dL (ref 31.5–35.7)
MCV: 88 fL (ref 79–97)
PLATELETS: 301 10*3/uL (ref 150–450)
RBC: 5.07 x10E6/uL (ref 4.14–5.80)
RDW: 14.6 % (ref 12.3–15.4)
WBC: 5.6 10*3/uL (ref 3.4–10.8)

## 2018-03-18 LAB — SPECIMEN STATUS REPORT

## 2018-03-18 MED ORDER — PITAVASTATIN CALCIUM 4 MG PO TABS: 4.0000 mg | ORAL_TABLET | Freq: Every day | ORAL | Status: DC

## 2018-03-18 NOTE — Patient Instructions (Signed)
Medication Instructions:  START LIVALO 4MG -SAMPLES GIVEN. If you need a refill on your cardiac medications before your next appointment, please call your pharmacy.  Labwork: CBC AND BMET TODAY HERE IN OUR OFFICE AT [facility] Take the provided lab slips with you to the lab for your blood draw.  If you have labs (blood work) drawn today and your tests are completely normal, you will receive your results only by [facility] (if you have MyChart) -OR- A paper copy in the mail.  If you have any lab test that is abnormal or we need to change your treatment, we will call you to review these results.  Follow-Up: You will need a follow up appointment in 2 weeks.  You may see Glenetta Hew, MD , Jory Sims, DNP, AACC  At [facility], you and your health needs are our priority.  As part of our continuing mission to provide you with exceptional heart care, we have created designated Provider Care Teams.  These Care Teams include your primary Cardiologist (physician) and Advanced Practice Providers (APPs -  Physician Assistants and Nurse Practitioners) who all work together to provide you with the care you need, when you need it.      Patrick Adams  03/18/2018  You are scheduled for a Cardiac Catheterization on Friday, December 20 with Dr. Peter Martinique.  1. Please arrive at the [facility] (Main Entrance A) at [facility]: [address] [place], [place] [address] at 1:00 PM (This time is two hours before your procedure to ensure your preparation). Free valet parking service is available.   Special note: Every effort is made to have your procedure done on time. Please understand that emergencies sometimes delay scheduled procedures.  2. Diet: Do not eat solid foods after midnight.  The patient may have clear liquids until 5am upon the day of the procedure.  3. Labs: You will need to have blood drawn on Thursday, December 19 at [place], [place]    Open: [place] (Lunch 12:30 - 1:30)   Phone: [phone]. You do not need to be fasting.  4. Medication instructions in preparation for your procedure:   Contrast Allergy: No  On the morning of your procedure, take your Aspirin and any morning medicines NOT listed above.  You may use sips of water.  5. Plan for one night stay--bring personal belongings. 6. Bring a current list of your medications and current insurance cards. 7. You MUST have a responsible person to drive you home. 8. Someone MUST be with you the first 24 hours after you arrive home or your discharge will be delayed. 9. Please wear clothes that are easy to get on and off and wear slip-on shoes.  Thank you for allowing [place] to care for you!   -- [facility]

## 2018-03-18 NOTE — Telephone Encounter (Signed)
°*  STAT* If patient is at the pharmacy, call can be transferred to refill team.   1. Which medications need to be refilled? (please list name of each medication and dose if known)  New prescription for Nitroglycerin   2. Which pharmacy/location (including street and city if local pharmacy) is medication to be sent to? [facility] [phone]  3. Do they need a 30 day or 90 day supply?

## 2018-03-18 NOTE — Telephone Encounter (Signed)
Returned call to pt, he states that he thought that Patrick Adams was going to "rx him the nitro" informed pt that she did not mention this NEW rx to me, and she is gone for the day. I am unable to rx this medication. Asked pt if he had CP and he states that he does not. Informed pt that should any CP develop he MUST go directly to the ER to be evaluated, this is very important because he is scheduled for a CATH tomorrow and we do not want him to have an MI. Pt verbalized understanding. He will go directly to the ER should he develop CP

## 2018-03-19 ENCOUNTER — Other Ambulatory Visit: Payer: Self-pay

## 2018-03-19 ENCOUNTER — Ambulatory Visit (HOSPITAL_COMMUNITY)
Admission: RE | Admit: 2018-03-19 | Discharge: 2018-03-19 | Disposition: A | Discharge status: Home / Self Care | Payer: Medicare HMO | Attending: Cardiology | Admitting: Cardiology

## 2018-03-19 ENCOUNTER — Encounter (HOSPITAL_COMMUNITY)
Admission: RE | Disposition: A | Discharge status: Home / Self Care | Payer: Self-pay | Source: Home / Self Care | Attending: Cardiology

## 2018-03-19 DIAGNOSIS — I25110 Atherosclerotic heart disease of native coronary artery with unstable angina pectoris: Secondary | ICD-10-CM

## 2018-03-19 DIAGNOSIS — I10 Essential (primary) hypertension: Secondary | ICD-10-CM | POA: Diagnosis not present

## 2018-03-19 DIAGNOSIS — R079 Chest pain, unspecified: Secondary | ICD-10-CM | POA: Diagnosis present

## 2018-03-19 DIAGNOSIS — Z955 Presence of coronary angioplasty implant and graft: Secondary | ICD-10-CM | POA: Insufficient documentation

## 2018-03-19 DIAGNOSIS — E785 Hyperlipidemia, unspecified: Secondary | ICD-10-CM | POA: Diagnosis present

## 2018-03-19 DIAGNOSIS — I200 Unstable angina: Secondary | ICD-10-CM

## 2018-03-19 DIAGNOSIS — E7800 Pure hypercholesterolemia, unspecified: Secondary | ICD-10-CM | POA: Diagnosis not present

## 2018-03-19 DIAGNOSIS — I252 Old myocardial infarction: Secondary | ICD-10-CM | POA: Insufficient documentation

## 2018-03-19 DIAGNOSIS — I712 Thoracic aortic aneurysm, without rupture: Secondary | ICD-10-CM | POA: Insufficient documentation

## 2018-03-19 DIAGNOSIS — Z888 Allergy status to other drugs, medicaments and biological substances status: Secondary | ICD-10-CM | POA: Diagnosis not present

## 2018-03-19 DIAGNOSIS — F17200 Nicotine dependence, unspecified, uncomplicated: Secondary | ICD-10-CM | POA: Insufficient documentation

## 2018-03-19 DIAGNOSIS — Z8679 Personal history of other diseases of the circulatory system: Secondary | ICD-10-CM | POA: Diagnosis not present

## 2018-03-19 DIAGNOSIS — Z79899 Other long term (current) drug therapy: Secondary | ICD-10-CM | POA: Insufficient documentation

## 2018-03-19 DIAGNOSIS — Z7982 Long term (current) use of aspirin: Secondary | ICD-10-CM | POA: Insufficient documentation

## 2018-03-19 DIAGNOSIS — I2510 Atherosclerotic heart disease of native coronary artery without angina pectoris: Secondary | ICD-10-CM | POA: Diagnosis present

## 2018-03-19 DIAGNOSIS — I2584 Coronary atherosclerosis due to calcified coronary lesion: Secondary | ICD-10-CM | POA: Insufficient documentation

## 2018-03-19 DIAGNOSIS — I25119 Atherosclerotic heart disease of native coronary artery with unspecified angina pectoris: Secondary | ICD-10-CM | POA: Diagnosis present

## 2018-03-19 HISTORY — PX: LEFT HEART CATH AND CORONARY ANGIOGRAPHY: CATH118249

## 2018-03-19 SURGERY — LEFT HEART CATH AND CORONARY ANGIOGRAPHY
Anesthesia: LOCAL

## 2018-03-19 MED ORDER — ASPIRIN 81 MG PO CHEW: 81.0000 mg | CHEWABLE_TABLET | ORAL | Status: DC

## 2018-03-19 MED ORDER — SODIUM CHLORIDE 0.9 % WEIGHT BASED INFUSION: 1.0000 mL/kg/h | INTRAVENOUS | Status: DC

## 2018-03-19 MED ORDER — NITROGLYCERIN 0.4 MG SL SUBL: 0.4000 mg | SUBLINGUAL_TABLET | SUBLINGUAL | 3 refills | Status: DC | PRN

## 2018-03-19 MED ORDER — LIDOCAINE HCL (PF) 1 % IJ SOLN
INTRAMUSCULAR | Status: AC
  Filled 2018-03-19: qty 30

## 2018-03-19 MED ORDER — FENTANYL CITRATE (PF) 100 MCG/2ML IJ SOLN
INTRAMUSCULAR | Status: DC | PRN
  Administered 2018-03-19: 25 ug via INTRAVENOUS

## 2018-03-19 MED ORDER — SODIUM CHLORIDE 0.9% FLUSH: 3.0000 mL | Freq: Two times a day (BID) | INTRAVENOUS | Status: DC

## 2018-03-19 MED ORDER — HEPARIN (PORCINE) IN NACL 1000-0.9 UT/500ML-% IV SOLN
INTRAVENOUS | Status: AC
  Filled 2018-03-19: qty 1000

## 2018-03-19 MED ORDER — SODIUM CHLORIDE 0.9% FLUSH: 3.0000 mL | INTRAVENOUS | Status: DC | PRN

## 2018-03-19 MED ORDER — FENTANYL CITRATE (PF) 100 MCG/2ML IJ SOLN
INTRAMUSCULAR | Status: AC
  Filled 2018-03-19: qty 2

## 2018-03-19 MED ORDER — MIDAZOLAM HCL 2 MG/2ML IJ SOLN
INTRAMUSCULAR | Status: DC | PRN
  Administered 2018-03-19: 1 mg via INTRAVENOUS

## 2018-03-19 MED ORDER — SODIUM CHLORIDE 0.9 % WEIGHT BASED INFUSION
3.0000 mL/kg/h | INTRAVENOUS | Status: AC
  Administered 2018-03-19: 3 mL/kg/h via INTRAVENOUS

## 2018-03-19 MED ORDER — LIDOCAINE HCL (PF) 1 % IJ SOLN
INTRAMUSCULAR | Status: DC | PRN
  Administered 2018-03-19: 2 mL

## 2018-03-19 MED ORDER — VERAPAMIL HCL 2.5 MG/ML IV SOLN
INTRAVENOUS | Status: AC
  Filled 2018-03-19: qty 2

## 2018-03-19 MED ORDER — HEPARIN SODIUM (PORCINE) 1000 UNIT/ML IJ SOLN
INTRAMUSCULAR | Status: DC | PRN
  Administered 2018-03-19: 5000 [IU] via INTRAVENOUS

## 2018-03-19 MED ORDER — MIDAZOLAM HCL 2 MG/2ML IJ SOLN
INTRAMUSCULAR | Status: AC
  Filled 2018-03-19: qty 2

## 2018-03-19 MED ORDER — VERAPAMIL HCL 2.5 MG/ML IV SOLN
INTRAVENOUS | Status: DC | PRN
  Administered 2018-03-19: 17:00:00 via INTRA_ARTERIAL

## 2018-03-19 MED ORDER — HEPARIN (PORCINE) IN NACL 1000-0.9 UT/500ML-% IV SOLN
INTRAVENOUS | Status: DC | PRN
  Administered 2018-03-19 (×2): 500 mL

## 2018-03-19 MED ORDER — SODIUM CHLORIDE 0.9 % IV SOLN: 250.0000 mL | INTRAVENOUS | Status: DC | PRN

## 2018-03-19 MED ORDER — HEPARIN SODIUM (PORCINE) 1000 UNIT/ML IJ SOLN
INTRAMUSCULAR | Status: AC
  Filled 2018-03-19: qty 1

## 2018-03-19 MED ORDER — ACETAMINOPHEN 325 MG PO TABS: 650.0000 mg | ORAL_TABLET | ORAL | Status: DC | PRN

## 2018-03-19 MED ORDER — ONDANSETRON HCL 4 MG/2ML IJ SOLN: 4.0000 mg | Freq: Four times a day (QID) | INTRAMUSCULAR | Status: DC | PRN

## 2018-03-19 SURGICAL SUPPLY — 10 items
CATH 5FR JL3.5 JR4 ANG PIG MP (CATHETERS) ×1 IMPLANT
DEVICE RAD COMP TR BAND LRG (VASCULAR PRODUCTS) ×1 IMPLANT
GLIDESHEATH SLEND SS 6F .021 (SHEATH) ×1 IMPLANT
GUIDEWIRE INQWIRE 1.5J.035X260 (WIRE) IMPLANT
INQWIRE 1.5J .035X260CM (WIRE) ×2
KIT HEART LEFT (KITS) ×2 IMPLANT
PACK CARDIAC CATHETERIZATION (CUSTOM PROCEDURE TRAY) ×2 IMPLANT
SYR MEDRAD MARK 7 150ML (SYRINGE) ×2 IMPLANT
TRANSDUCER W/STOPCOCK (MISCELLANEOUS) ×2 IMPLANT
TUBING CIL FLEX 10 FLL-RA (TUBING) ×2 IMPLANT

## 2018-03-19 NOTE — Discharge Instructions (Signed)
Radial Site Care ° °This sheet gives you information about how to care for yourself after your procedure. Your health care provider may also give you more specific instructions. If you have problems or questions, contact your health care provider. °What can I expect after the procedure? °After the procedure, it is common to have: °· Bruising and tenderness at the catheter insertion area. °Follow these instructions at home: °Medicines °· Take over-the-counter and prescription medicines only as told by your health care provider. °Insertion site care °· Follow instructions from your health care provider about how to take care of your insertion site. Make sure you: °? Wash your hands with soap and water before you change your bandage (dressing). If soap and water are not available, use hand sanitizer. °? Change your dressing as told by your health care provider. °? Leave stitches (sutures), skin glue, or adhesive strips in place. These skin closures may need to stay in place for 2 weeks or longer. If adhesive strip edges start to loosen and curl up, you may trim the loose edges. Do not remove adhesive strips completely unless your health care provider tells you to do that. °· Check your insertion site every day for signs of infection. Check for: °? Redness, swelling, or pain. °? Fluid or blood. °? Pus or a bad smell. °? Warmth. °· Do not take baths, swim, or use a hot tub until your health care provider approves. °· You may shower 24-48 hours after the procedure, or as directed by your health care provider. °? Remove the dressing and gently wash the site with plain soap and water. °? Pat the area dry with a clean towel. °? Do not rub the site. That could cause bleeding. °· Do not apply powder or lotion to the site. °Activity ° °· For 24 hours after the procedure, or as directed by your health care provider: °? Do not flex or bend the affected arm. °? Do not push or pull heavy objects with the affected arm. °? Do not  drive yourself home from the hospital or clinic. You may drive 24 hours after the procedure unless your health care provider tells you not to. °? Do not operate machinery or power tools. °· Do not lift anything that is heavier than 10 lb (4.5 kg), or the limit that you are told, until your health care provider says that it is safe. °· Ask your health care provider when it is okay to: °? Return to work or school. °? Resume usual physical activities or sports. °? Resume sexual activity. °General instructions °· If the catheter site starts to bleed, raise your arm and put firm pressure on the site. If the bleeding does not stop, get help right away. This is a medical emergency. °· If you went home on the same day as your procedure, a responsible adult should be with you for the first 24 hours after you arrive home. °· Keep all follow-up visits as told by your health care provider. This is important. °Contact a health care provider if: °· You have a fever. °· You have redness, swelling, or yellow drainage around your insertion site. °Get help right away if: °· You have unusual pain at the radial site. °· The catheter insertion area swells very fast. °· The insertion area is bleeding, and the bleeding does not stop when you hold steady pressure on the area. °· Your arm or hand becomes pale, cool, tingly, or numb. °These symptoms may represent a serious problem   that is an emergency. Do not wait to see if the symptoms will go away. Get medical help right away. Call your local emergency services (911 in the [place] Do not drive yourself to the hospital. Summary  After the procedure, it is common to have bruising and tenderness at the site.  Follow instructions from your health care provider about how to take care of your radial site wound. Check the wound every day for signs of infection.  Do not lift anything that is heavier than 10 lb (4.5 kg), or the limit that you are told, until your health care provider says  that it is safe. This information is not intended to replace advice given to you by your health care provider. Make sure you discuss any questions you have with your health care provider. Document Released: 04/19/2010 Document Revised: 04/22/2017 Document Reviewed: 04/22/2017 Elsevier Interactive Patient Education  2019 [employer].    Moderate Conscious Sedation, Adult, Care After These instructions provide you with information about caring for yourself after your procedure. Your health care provider may also give you more specific instructions. Your treatment has been planned according to current medical practices, but problems sometimes occur. Call your health care provider if you have any problems or questions after your procedure. What can I expect after the procedure? After your procedure, it is common:  To feel sleepy for several hours.  To feel clumsy and have poor balance for several hours.  To have poor judgment for several hours.  To vomit if you eat too soon. Follow these instructions at home: For at least 24 hours after the procedure:   Do not: ? Participate in activities where you could fall or become injured. ? Drive. ? Use heavy machinery. ? Drink alcohol. ? Take sleeping pills or medicines that cause drowsiness. ? Make important decisions or sign legal documents. ? Take care of children on your own.  Rest. Eating and drinking  Follow the diet recommended by your health care provider.  If you vomit: ? Drink water, juice, or soup when you can drink without vomiting. ? Make sure you have little or no nausea before eating solid foods. General instructions  Have a responsible adult stay with you until you are awake and alert.  Take over-the-counter and prescription medicines only as told by your health care provider.  If you smoke, do not smoke without supervision.  Keep all follow-up visits as told by your health care provider. This is  important. Contact a health care provider if:  You keep feeling nauseous or you keep vomiting.  You feel light-headed.  You develop a rash.  You have a fever. Get help right away if:  You have trouble breathing. This information is not intended to replace advice given to you by your health care provider. Make sure you discuss any questions you have with your health care provider. Document Released: 01/05/2013 Document Revised: 08/20/2015 Document Reviewed: 07/07/2015 Elsevier Interactive Patient Education  2019 [employer].

## 2018-03-19 NOTE — Interval H&P Note (Signed)
History and Physical Interval Note:  03/19/2018 4:42 PM  Patrick Adams  has presented today for surgery, with the diagnosis of cad, recurring severe cp  The various methods of treatment have been discussed with the patient and family. After consideration of risks, benefits and other options for treatment, the patient has consented to  Procedure(s): LEFT HEART CATH AND CORONARY ANGIOGRAPHY (N/A) as a surgical intervention .  The patient's history has been reviewed, patient examined, no change in status, stable for surgery.  I have reviewed the patient's chart and labs.  Questions were answered to the patient's satisfaction.   Cath Lab Visit (complete for each Cath Lab visit)  Clinical Evaluation Leading to the Procedure:   ACS: Yes.    Non-ACS:    Anginal Classification: CCS III  Anti-ischemic medical therapy: No Therapy  Non-Invasive Test Results: No non-invasive testing performed  Prior CABG: No previous CABG        [name] [facility] 03/19/2018 4:43 PM

## 2018-03-21 NOTE — Progress Notes (Signed)
Thank you :)

## 2018-03-22 ENCOUNTER — Encounter (HOSPITAL_COMMUNITY): Payer: Self-pay | Admitting: Cardiology

## 2018-03-23 LAB — BASIC METABOLIC PANEL
BUN / CREAT RATIO: 11 (ref 10–24)
BUN: 12 mg/dL (ref 8–27)
CO2: 22 mmol/L (ref 20–29)
Calcium: 10.3 mg/dL — ABNORMAL HIGH (ref 8.6–10.2)
Chloride: 100 mmol/L (ref 96–106)
Creatinine, Ser: 1.11 mg/dL (ref 0.76–1.27)
GFR calc Af Amer: 77 mL/min/{1.73_m2} (ref >59)
GFR, EST NON AFRICAN AMERICAN: 67 mL/min/{1.73_m2} (ref >59)
Glucose: 74 mg/dL (ref 65–99)
Potassium: 4.6 mmol/L (ref 3.5–5.2)
SODIUM: 137 mmol/L (ref 134–144)

## 2018-03-23 LAB — CBC

## 2018-03-23 NOTE — Progress Notes (Signed)
Notes recorded by Lendon Colonel, NP on 03/21/2018 at 1:58 PM EST CBC is normal

## 2018-04-04 NOTE — Progress Notes (Signed)
Cardiology Office Note   Date:  04/05/2018   ID:  Patrick Adams, DOB 1946/02/27, MRN [id number]  PCP:  [name], MD  Cardiologist: [facility]  Chief Complaint  Patient presents with  . Follow-up  . Coronary Artery Disease     History of Present Illness: Patrick Adams is a 73 y.o. male who presents for ongoing assessment and management of CAD with hx of PCI of the mid LAD due to thrombotic stenosis on 01/31/2018, followed by staged intervention of the mid RCA with placement of DES on 02/03/2018. He is statin intolerant but is being followed by PharmD for slow re-institution of statin, he eventually stopped it on his own.He unfortunately continues to smoke.   He was last seen in the office on 03/18/2018 after calling reporting recurrent chest pain exactly like he experienced prior to his PCI. He was sent for repeat cardiac cath to evaluate for early stent failure. This was completed by Dr.Jordan on 03/19/2018 which revealed widely patent stents and no significant obstructive CAD.   He comes today with multiple questions. He has not had any recurrence of chest pain. He is medically compliant.   Past Medical History:  Diagnosis Date  . Hemochromatosis   . History of abdominal aortic aneurysm (AAA) repair    With thoracic aortic aneurysm being monitored  . Hypertension     Past Surgical History:  Procedure Laterality Date  . ABDOMINAL AORTIC ANEURYSM REPAIR    . CORONARY STENT INTERVENTION N/A 02/03/2018   Procedure: CORONARY STENT INTERVENTION;  Surgeon: Belva Crome, MD;  Location: [facility];  Service: Cardiovascular;  Laterality: N/A;  . CORONARY/GRAFT ACUTE MI REVASCULARIZATION N/A 01/31/2018   Procedure: Coronary/Graft Acute MI Revascularization;  Surgeon: Leonie Man, MD;  Location: [facility];  Service: Cardiovascular;  Laterality: N/A;  . LEFT HEART CATH AND CORONARY ANGIOGRAPHY N/A 01/31/2018   Procedure: LEFT HEART CATH AND CORONARY  ANGIOGRAPHY;  Surgeon: Leonie Man, MD;  Location: [facility];  Service: Cardiovascular;  Laterality: N/A;  . LEFT HEART CATH AND CORONARY ANGIOGRAPHY N/A 02/03/2018   Procedure: LEFT HEART CATH AND CORONARY ANGIOGRAPHY;  Surgeon: Belva Crome, MD;  Location: [facility];  Service: Cardiovascular;  Laterality: N/A;  . LEFT HEART CATH AND CORONARY ANGIOGRAPHY N/A 03/19/2018   Procedure: LEFT HEART CATH AND CORONARY ANGIOGRAPHY;  Surgeon: Martinique, Peter M, MD;  Location: [facility];  Service: Cardiovascular;  Laterality: N/A;     Current Outpatient Medications  Medication Sig Dispense Refill  . acetaminophen (TYLENOL) 500 MG tablet Take 1,000 mg by mouth 2 (two) times daily as needed for headache.     Marland Kitchen aspirin EC 81 MG tablet Take 81 mg by mouth at bedtime.    Marland Kitchen ezetimibe (ZETIA) 10 MG tablet Take 1 tablet (10 mg total) by mouth daily. 30 tablet 11  . losartan (COZAAR) 25 MG tablet Take 1 tablet (25 mg total) by mouth daily. (Patient taking differently: Take 25 mg by mouth at bedtime. )    . nitroGLYCERIN (NITROSTAT) 0.4 MG SL tablet Place 1 tablet (0.4 mg total) under the tongue every 5 (five) minutes as needed for chest pain. 100 tablet 3  . oxymetazoline (AFRIN) 0.05 % nasal spray Place 1 spray into both nostrils daily as needed (nose bleeds).    . Pitavastatin Calcium (LIVALO) 4 MG TABS Take 1 tablet (4 mg total) by mouth daily. 30 tablet   . ticagrelor (BRILINTA)  90 MG TABS tablet Take 1 tablet (90 mg total) by mouth 2 (two) times daily. 180 tablet 2   No current facility-administered medications for this visit.    Facility-Administered Medications Ordered in Other Visits  Medication Dose Route Frequency Provider Last Rate Last Dose  . 0.9 %  sodium chloride infusion    Continuous PRN Leonie Man, MD 10 mL/hr at 01/31/18 1912 10 mL/hr at 01/31/18 1912  . 0.9 %  sodium chloride infusion    Continuous PRN Leonie Man, MD 100 mL/hr at 01/31/18 1912    .  fentaNYL (SUBLIMAZE) injection    PRN Leonie Man, MD   25 mcg at 01/31/18 1933  . iohexol (OMNIPAQUE) 350 MG/ML injection    PRN Leonie Man, MD   165 mL at 01/31/18 2019  . lidocaine (PF) (XYLOCAINE) 1 % injection    PRN Leonie Man, MD   2 mL at 01/31/18 1914  . midazolam (VERSED) injection    PRN Leonie Man, MD   1 mg at 01/31/18 1933  . morphine 2 MG/ML injection    PRN Leonie Man, MD   2 mg at 01/31/18 2030  . morphine 2 MG/ML injection    PRN Leonie Man, MD   2 mg at 01/31/18 2017    Allergies:   Statins    Social History:  The patient  reports that he has been smoking. He has never used smokeless tobacco. He reports that he does not drink alcohol or use drugs.   Family History:  The patient's family history is not on file.    ROS: All other systems are reviewed and negative. Unless otherwise mentioned in H&P    PHYSICAL EXAM: VS:  BP 134/83   Pulse 67   Ht 6' (1.829 m)   Wt 180 lb 6.4 oz (81.8 kg)   BMI 24.47 kg/m  , BMI Body mass index is 24.47 kg/m. GEN: Well nourished, well developed, in no acute distress HEENT: normal Neck: no JVD, carotid bruits, or masses Cardiac: RRR; no murmurs, rubs, or gallops,no edema  Respiratory:  Clear to auscultation bilaterally, normal work of breathing GI: soft, nontender, nondistended, + BS MS: no deformity or atrophy. Right wrist catheter insertion site is well healed.  Skin: warm and dry, no rash Neuro:  Strength and sensation are intact Psych: euthymic mood, full affect   EKG:  NSR rate of 67 bpm. Evidence of inferior infarct.   Recent Labs: 01/31/2018: ALT 13 03/18/2018: BUN 12; Creatinine, Ser 1.11; Hemoglobin CANCELED; Platelets CANCELED; Potassium 4.6; Sodium 137    Lipid Panel    Component Value Date/Time   CHOL 194 02/01/2018 0239   TRIG 156 (H) 02/01/2018 0239   HDL 34 (L) 02/01/2018 0239   CHOLHDL 5.7 02/01/2018 0239   VLDL 31 02/01/2018 0239   LDLCALC 129 (H) 02/01/2018 0239       Wt Readings from Last 3 Encounters:  04/05/18 180 lb 6.4 oz (81.8 kg)  03/19/18 187 lb (84.8 kg)  03/18/18 176 lb 9.6 oz (80.1 kg)      Other studies Reviewed: Cardiac Cath 03/19/2018  Non-stenotic Prox LAD to Mid LAD lesion was previously treated.  Non-stenotic Prox LAD lesion was previously treated.  Ost 1st Mrg lesion is 65% stenosed.  Previously placed Post Atrio stent (unknown type) is widely patent.  Previously placed Mid RCA-2 stent (unknown type) is widely patent.  Previously placed Mid RCA-1 stent (unknown type) is widely patent.  Previously placed Prox RCA to Mid RCA stent (unknown type) is widely patent.  Dist LAD lesion is 70% stenosed.  The left ventricular systolic function is normal.  LV end diastolic pressure is normal.  The left ventricular ejection fraction is 55-65% by visual estimate.   1. No significant obstructive CAD. Moderate disease in very distal LAD. Continued patency of all stents. 2. Good LV function 3. Normal LVEDP  Plan: LV function has improved significantly. I see no angiographic reason for recurrent chest pain. Continue medical management.   ASSESSMENT AND PLAN:  1. CAD: Hx of PCI of the mid LAD, and staged intervention of the mid RCA. His repeat cardiac cath as above did not reveal instent restenosis, I have answered multiple questions concerning prognosis and symptoms. I will not make any changes in his regimen.   2. Tobacco abuse: He is down to 5 cigarettes a day, and is weaning himself as times goes on with desire to quit completley.   3. Hypercholesterolemia: He is statin intolerant. He will continue to be followed by Pharm D and stay on Zetia and Livalo. He refuses PCSK 9 inhibitor.    Current medicines are reviewed at length with the patient today.    Labs/ tests ordered today include: None   Phill Myron. West Pugh, ANP, AACC   04/05/2018 3:31 PM    [facility] [place] [address] Office [phone] Fax [phone]

## 2018-04-05 ENCOUNTER — Ambulatory Visit: Payer: Medicare HMO | Admitting: Adult Health

## 2018-04-05 ENCOUNTER — Encounter: Payer: Self-pay | Admitting: Adult Health

## 2018-04-05 VITALS — BP 134/83 | HR 67 | Ht 72.0 in | Wt 180.4 lb

## 2018-04-05 DIAGNOSIS — E7800 Pure hypercholesterolemia, unspecified: Secondary | ICD-10-CM

## 2018-04-05 DIAGNOSIS — I2510 Atherosclerotic heart disease of native coronary artery without angina pectoris: Secondary | ICD-10-CM | POA: Diagnosis not present

## 2018-04-05 DIAGNOSIS — Z720 Tobacco use: Secondary | ICD-10-CM

## 2018-04-05 DIAGNOSIS — I10 Essential (primary) hypertension: Secondary | ICD-10-CM

## 2018-04-05 NOTE — Patient Instructions (Signed)
Follow-Up: You will need a follow up appointment in 6 months.  Please call our office 2 months in advance (MAY 2020)  to schedule this appointment (June 2020) .  You may see Glenetta Hew, MD Jory Sims, DNP, AACC  or one of the following Advanced Practice Providers on your designated Care Team:  Jory Sims, DNP, AACC   Rosaria Ferries, PA-C  Medication Instructions:  NO CHANGES- Your physician recommends that you continue on your current medications as directed. Please refer to the Current Medication list given to you today. If you need a refill on your cardiac medications before your next appointment, please call your pharmacy.  Labwork: When you have labs (blood work) and your tests are completely normal, you will receive your results ONLY by [place] (if you have MyChart) -OR- A paper copy in the mail.  At [facility], you and your health needs are our priority.  As part of our continuing mission to provide you with exceptional heart care, we have created designated Provider Care Teams.  These Care Teams include your primary Cardiologist (physician) and Advanced Practice Providers (APPs -  Physician Assistants and Nurse Practitioners) who all work together to provide you with the care you need, when you need it.  Thank you for choosing [facility]!!

## 2018-05-10 ENCOUNTER — Other Ambulatory Visit: Payer: Self-pay | Admitting: Adult Health

## 2018-05-10 MED ORDER — LOSARTAN POTASSIUM 25 MG PO TABS: 25.0000 mg | ORAL_TABLET | Freq: Every day | ORAL | 6 refills | Status: DC

## 2018-05-10 NOTE — Telephone Encounter (Signed)
 *  STAT* If patient is at the pharmacy, call can be transferred to refill team.   1. Which medications need to be refilled? (please list name of each medication and dose if known) losartan (COZAAR) 25 MG tablet  2. Which pharmacy/location (including street and city if local pharmacy) is medication to be sent to? [facility]  3. Do they need a 30 day or 90 day supply? [place]

## 2018-05-20 ENCOUNTER — Telehealth: Payer: Self-pay | Admitting: Cardiology

## 2018-05-20 NOTE — Telephone Encounter (Signed)
  Patient is calling because he received a call to remind him of his appt on 05/25/18. He stated that he thought this appt had been cancelled because when he last spoke with the pharmacist he understood her to say he did not need to keep this appt. Please call to let him know if he is supposed to keep appt or not.

## 2018-05-21 NOTE — Telephone Encounter (Signed)
LMOM that we will cancel this appointment.  Patient will be contacted once bempedoic acid becomes available

## 2018-05-25 ENCOUNTER — Ambulatory Visit: Payer: Medicare HMO

## 2018-06-04 ENCOUNTER — Emergency Department (HOSPITAL_COMMUNITY)
Admission: EM | Admit: 2018-06-04 | Discharge: 2018-06-05 | Disposition: A | Discharge status: Home / Self Care | Payer: Medicare HMO | Attending: Emergency Medicine | Admitting: Emergency Medicine

## 2018-06-04 ENCOUNTER — Emergency Department (HOSPITAL_COMMUNITY): Payer: Medicare HMO

## 2018-06-04 ENCOUNTER — Other Ambulatory Visit: Payer: Self-pay

## 2018-06-04 ENCOUNTER — Encounter (HOSPITAL_COMMUNITY): Payer: Self-pay | Admitting: *Deleted

## 2018-06-04 DIAGNOSIS — S43401A Unspecified sprain of right shoulder joint, initial encounter: Secondary | ICD-10-CM | POA: Insufficient documentation

## 2018-06-04 DIAGNOSIS — Y999 Unspecified external cause status: Secondary | ICD-10-CM | POA: Insufficient documentation

## 2018-06-04 DIAGNOSIS — Z79899 Other long term (current) drug therapy: Secondary | ICD-10-CM | POA: Insufficient documentation

## 2018-06-04 DIAGNOSIS — I10 Essential (primary) hypertension: Secondary | ICD-10-CM | POA: Diagnosis not present

## 2018-06-04 DIAGNOSIS — S299XXA Unspecified injury of thorax, initial encounter: Secondary | ICD-10-CM | POA: Diagnosis present

## 2018-06-04 DIAGNOSIS — I25720 Atherosclerosis of autologous artery coronary artery bypass graft(s) with unstable angina pectoris: Secondary | ICD-10-CM | POA: Diagnosis not present

## 2018-06-04 DIAGNOSIS — Y9389 Activity, other specified: Secondary | ICD-10-CM | POA: Diagnosis not present

## 2018-06-04 DIAGNOSIS — W1789XA Other fall from one level to another, initial encounter: Secondary | ICD-10-CM | POA: Insufficient documentation

## 2018-06-04 DIAGNOSIS — Y92009 Unspecified place in unspecified non-institutional (private) residence as the place of occurrence of the external cause: Secondary | ICD-10-CM | POA: Insufficient documentation

## 2018-06-04 DIAGNOSIS — I252 Old myocardial infarction: Secondary | ICD-10-CM | POA: Insufficient documentation

## 2018-06-04 DIAGNOSIS — Z7982 Long term (current) use of aspirin: Secondary | ICD-10-CM | POA: Insufficient documentation

## 2018-06-04 DIAGNOSIS — S298XXA Other specified injuries of thorax, initial encounter: Secondary | ICD-10-CM | POA: Diagnosis not present

## 2018-06-04 DIAGNOSIS — W19XXXA Unspecified fall, initial encounter: Secondary | ICD-10-CM

## 2018-06-04 HISTORY — DX: Acute myocardial infarction, unspecified: I21.9

## 2018-06-04 NOTE — ED Triage Notes (Signed)
Pt states he was on the deck and tripped over his feet and fell.  Pt states his deck is three feet high.  Pt states he feel on his right arm and side.  Pt denies hitting his head or having LOC.  Pt takes brilinta d/t to stents he had placed in his heart in November. Pt states he did cough up a dime size of blood this evening.  Pt states he took 2 advil and 2 tyenlol at 21:00 this evening. Pt a/o x 4 and ambulatory.

## 2018-06-04 NOTE — ED Notes (Signed)
Patient fell off his back deck, states his feet got tangled up. His R ribs and RUA hurt, also he said he is on a blood thinner and he coughed up some blood.

## 2018-06-04 NOTE — ED Provider Notes (Signed)
[facility] DEPT Provider Note   CSN: [id number] Arrival date & time: 06/04/18  2218    History   Chief Complaint Chief Complaint  Patient presents with  . Fall   Patient gives permission to perform history and physical in front of family/friend  HPI Patrick Adams is a 73 y.o. male.     The history is provided by the patient and a relative.  Fall  This is a new problem. The current episode started 3 to 5 hours ago. The problem occurs constantly. The problem has been gradually improving. Pertinent negatives include no abdominal pain, no headaches and no shortness of breath. Associated symptoms comments: Chest wall pain. Exacerbated by: breathing. The symptoms are relieved by rest.  he reports he was walking off his deck and tripped.  He reports he fell about 3 feet landing on his right arm or right chest.  No LOC, did not hit his head.  No neck or back pain.  No abdominal pain.  He reports it hurts to breathe.  He does report he coughed up sputum once with a small amount of blood in it. Patient takes Brilinta Past Medical History:  Diagnosis Date  . Heart attack ([place])   . Hemochromatosis   . History of abdominal aortic aneurysm (AAA) repair    With thoracic aortic aneurysm being monitored  . Hypertension     Patient Active Problem List   Diagnosis Date Noted  . Family history of tobacco abuse   . Acute ST elevation myocardial infarction (STEMI) involving left anterior descending coronary artery ([place]) 01/31/2018  . 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 Essential hypertension 01/31/2018  . Hyperlipidemia with target LDL less than 70 01/31/2018  . Acute ST elevation myocardial infarction (STEMI) involving left anterior descending (LAD) coronary artery ([place]) 01/31/2018  . Acute combined systolic and diastolic heart failure ([place]) 01/31/2018  . Ischemic cardiomyopathy 01/31/2018     Past Surgical History:  Procedure Laterality Date  . ABDOMINAL AORTIC ANEURYSM REPAIR    . CORONARY STENT INTERVENTION N/A 02/03/2018   Procedure: CORONARY STENT INTERVENTION;  Surgeon: Belva Crome, MD;  Location: [facility];  Service: Cardiovascular;  Laterality: N/A;  . CORONARY/GRAFT ACUTE MI REVASCULARIZATION N/A 01/31/2018   Procedure: Coronary/Graft Acute MI Revascularization;  Surgeon: Leonie Man, MD;  Location: [facility];  Service: Cardiovascular;  Laterality: N/A;  . LEFT HEART CATH AND CORONARY ANGIOGRAPHY N/A 01/31/2018   Procedure: LEFT HEART CATH AND CORONARY ANGIOGRAPHY;  Surgeon: Leonie Man, MD;  Location: [facility];  Service: Cardiovascular;  Laterality: N/A;  . LEFT HEART CATH AND CORONARY ANGIOGRAPHY N/A 02/03/2018   Procedure: LEFT HEART CATH AND CORONARY ANGIOGRAPHY;  Surgeon: Belva Crome, MD;  Location: [facility];  Service: Cardiovascular;  Laterality: N/A;  . LEFT HEART CATH AND CORONARY ANGIOGRAPHY N/A 03/19/2018   Procedure: LEFT HEART CATH AND CORONARY ANGIOGRAPHY;  Surgeon: Martinique, Peter M, MD;  Location: [facility];  Service: Cardiovascular;  Laterality: N/A;        Home Medications    Prior to Admission medications   Medication Sig Start Date End Date Taking? Authorizing Provider  acetaminophen (TYLENOL) 500 MG tablet Take 1,000 mg by mouth 2 (two) times daily as needed for headache.     [provider]  aspirin EC 81 MG tablet Take 81 mg by mouth at bedtime.  [provider]  ezetimibe (ZETIA) 10 MG tablet Take 1 tablet (10 mg total) by mouth daily. 02/15/18   Lendon Colonel, NP  losartan (COZAAR) 25 MG tablet Take 1 tablet (25 mg total) by mouth daily. 05/10/18   Martinique, Peter M, MD  nitroGLYCERIN (NITROSTAT) 0.4 MG SL tablet Place 1 tablet (0.4 mg total) under the tongue every 5 (five) minutes as needed for chest pain. 03/19/18 03/19/19  Martinique, Peter M, MD  oxymetazoline  (AFRIN) 0.05 % nasal spray Place 1 spray into both nostrils daily as needed (nose bleeds).    [provider]  Pitavastatin Calcium (LIVALO) 4 MG TABS Take 1 tablet (4 mg total) by mouth daily. 03/18/18   Lendon Colonel, NP  ticagrelor (BRILINTA) 90 MG TABS tablet Take 1 tablet (90 mg total) by mouth 2 (two) times daily. 02/04/18   Cheryln Manly, NP    Family History No family history on file.  Social History Social History   Tobacco Use  . Smoking status: Current Every Day Smoker    Packs/day: 0.50    Types: Cigarettes  . Smokeless tobacco: Never Used  Substance Use Topics  . Alcohol use: No  . Drug use: No     Allergies   Statins   Review of Systems Review of Systems  Respiratory: Negative for shortness of breath.   Gastrointestinal: Positive for nausea. Negative for abdominal pain and vomiting.  Musculoskeletal: Positive for arthralgias. Negative for back pain and neck pain.  Neurological: Negative for headaches.  All other systems reviewed and are negative.    Physical Exam Updated Vital Signs BP (!) 145/87 (BP Location: Left Arm)   Pulse 75   Temp (!) 97.4 F (36.3 C) (Oral)   Resp 18   Ht 1.829 m (6')   Wt 81.6 kg   SpO2 99%   BMI 24.41 kg/m   Physical Exam   CONSTITUTIONAL: Well developed/well nourished HEAD: Normocephalic/atraumatic EYES: EOMI/PERRL ENMT: Mucous membranes moist NECK: supple no meningeal signs SPINE/BACK:entire spine nontender, nexus criteria met No bruising/crepitance/stepoffs noted to spine CV: S1/S2 noted, no murmurs/rubs/gallops noted LUNGS: Lungs are clear to auscultation bilaterally, no apparent distress Chest-diffuse right-sided chest wall tenderness, no crepitus or bruising ABDOMEN: soft, nontender, no rebound or guarding, bowel sounds noted throughout abdomen, no bruising, no RUQ tenderness GU:no cva tenderness NEURO: Pt is awake/alert/appropriate, moves all extremitiesx4.  No facial droop.     EXTREMITIES: pulses normal/equal, full ROM, amount of bruising to right humerus.  Tenderness to palpation of right humerus.  No deformities.  Patient has limitation in range of motion of right shoulder. All other extremities/joints palpated/ranged and nontender SKIN: warm, color normal PSYCH: no abnormalities of mood noted, alert and oriented to situation  ED Treatments / Results  Labs (all labs ordered are listed, but only abnormal results are displayed) Labs Reviewed - No data to display  EKG None  Radiology Dg Ribs Unilateral W/chest Right  Result Date: 06/05/2018 CLINICAL DATA:  Right rib pain after fall off deck. EXAM: RIGHT RIBS AND CHEST - 3+ VIEW COMPARISON:  Chest radiograph 01/31/2018 FINDINGS: No fracture or other bone lesions are seen involving the ribs. There is no evidence of pneumothorax or pleural effusion. Both lungs are clear. Heart is normal in size, coronary stent is visualized. Again seen tortuosity of the thoracic aorta. IMPRESSION: No visualized right rib fracture or pulmonary complication. Electronically Signed   By: Keith Rake M.D.   On: 06/05/2018 00:09   Dg Humerus Right  Result Date: 06/05/2018 CLINICAL DATA:  Right arm/humerus pain after fall off deck. EXAM: RIGHT HUMERUS - 2+ VIEW COMPARISON:  None. FINDINGS: There is no evidence of fracture or other focal bone lesions. Mild chronic degenerative change suspected about the shoulder. Soft tissues are unremarkable. IMPRESSION: No acute fracture of the right humerus. Electronically Signed   By: Keith Rake M.D.   On: 06/05/2018 00:08    Procedures Procedures (including critical care time)  Medications Ordered in ED Medications - No data to display   Initial Impression / Assessment and Plan / ED Course  I have reviewed the triage vital signs and the nursing notes.  Pertinent labs & imaging results that were available during my care of the patient were reviewed by me and considered in my medical  decision making (see chart for details).        12:00 AM Pt declines pain meds 12:53 AM Imaging negative.  Patient has sling on tolerating well.  Given incentive spirometer. He will follow-up with orthopedics, favor soft tissue injury of right shoulder Patient declines pain meds. No signs of any acute head/spinal/abdominal trauma.  Final Clinical Impressions(s) / ED Diagnoses   Final diagnoses:  Fall, initial encounter  Blunt trauma to chest, initial encounter  Sprain of right shoulder, unspecified shoulder sprain type, initial encounter    ED Discharge Orders    None       Ripley Fraise, MD 06/05/18 [phone]

## 2018-06-04 NOTE — ED Notes (Addendum)
Patient ambulated back from X-Ray.

## 2018-06-05 NOTE — ED Notes (Addendum)
Sling immobilizer applied to R arm. IS given, patient education provided, patient demonstrated correct use of IS and verbalized how often he should perform exercises.

## 2018-07-23 ENCOUNTER — Telehealth: Payer: Self-pay | Admitting: Adult Health

## 2018-07-23 NOTE — Telephone Encounter (Signed)
New Message           Patient is calling to see if it's ok to take a multivitamin with "Berlinta" pls call to advise

## 2018-07-23 NOTE — Telephone Encounter (Signed)
Pt informed. Pt verbalized understanding. No further questions .

## 2018-07-23 NOTE — Telephone Encounter (Signed)
Patrick Adams to take multivitamin with Brilinta

## 2018-08-15 ENCOUNTER — Encounter (HOSPITAL_COMMUNITY): Payer: Self-pay

## 2018-08-15 ENCOUNTER — Emergency Department (HOSPITAL_COMMUNITY)
Admission: EM | Admit: 2018-08-15 | Discharge: 2018-08-16 | Disposition: A | Discharge status: Home / Self Care | Payer: Medicare HMO | Attending: Emergency Medicine | Admitting: Emergency Medicine

## 2018-08-15 ENCOUNTER — Emergency Department (HOSPITAL_COMMUNITY): Payer: Medicare HMO

## 2018-08-15 ENCOUNTER — Other Ambulatory Visit: Payer: Self-pay

## 2018-08-15 DIAGNOSIS — F17210 Nicotine dependence, cigarettes, uncomplicated: Secondary | ICD-10-CM | POA: Insufficient documentation

## 2018-08-15 DIAGNOSIS — I10 Essential (primary) hypertension: Secondary | ICD-10-CM | POA: Diagnosis not present

## 2018-08-15 DIAGNOSIS — Z79899 Other long term (current) drug therapy: Secondary | ICD-10-CM | POA: Diagnosis not present

## 2018-08-15 DIAGNOSIS — I252 Old myocardial infarction: Secondary | ICD-10-CM | POA: Diagnosis not present

## 2018-08-15 DIAGNOSIS — Z7982 Long term (current) use of aspirin: Secondary | ICD-10-CM | POA: Insufficient documentation

## 2018-08-15 DIAGNOSIS — I2510 Atherosclerotic heart disease of native coronary artery without angina pectoris: Secondary | ICD-10-CM | POA: Diagnosis not present

## 2018-08-15 DIAGNOSIS — R1032 Left lower quadrant pain: Secondary | ICD-10-CM | POA: Insufficient documentation

## 2018-08-15 DIAGNOSIS — K5792 Diverticulitis of intestine, part unspecified, without perforation or abscess without bleeding: Secondary | ICD-10-CM | POA: Insufficient documentation

## 2018-08-15 DIAGNOSIS — R109 Unspecified abdominal pain: Secondary | ICD-10-CM | POA: Diagnosis present

## 2018-08-15 LAB — CBC WITH DIFFERENTIAL/PLATELET
Abs Immature Granulocytes: 0.04 10*3/uL (ref 0.00–0.07)
Basophils Absolute: 0 10*3/uL (ref 0.0–0.1)
Basophils Relative: 0 %
Eosinophils Absolute: 0 10*3/uL (ref 0.0–0.5)
Eosinophils Relative: 0 %
HCT: 43.4 % (ref 39.0–52.0)
Hemoglobin: 14.8 g/dL (ref 13.0–17.0)
Immature Granulocytes: 0 %
Lymphocytes Relative: 6 %
Lymphs Abs: 0.8 10*3/uL (ref 0.7–4.0)
MCH: 31.2 pg (ref 26.0–34.0)
MCHC: 34.1 g/dL (ref 30.0–36.0)
MCV: 91.4 fL (ref 80.0–100.0)
Monocytes Absolute: 0.8 10*3/uL (ref 0.1–1.0)
Monocytes Relative: 6 %
Neutro Abs: 10.8 10*3/uL — ABNORMAL HIGH (ref 1.7–7.7)
Neutrophils Relative %: 88 %
Platelets: 253 10*3/uL (ref 150–400)
RBC: 4.75 MIL/uL (ref 4.22–5.81)
RDW: 12.8 % (ref 11.5–15.5)
WBC: 12.5 10*3/uL — ABNORMAL HIGH (ref 4.0–10.5)
nRBC: 0 % (ref 0.0–0.2)

## 2018-08-15 LAB — COMPREHENSIVE METABOLIC PANEL
ALT: 12 U/L (ref 0–44)
AST: 12 U/L — ABNORMAL LOW (ref 15–41)
Albumin: 4 g/dL (ref 3.5–5.0)
Alkaline Phosphatase: 127 U/L — ABNORMAL HIGH (ref 38–126)
Anion gap: 8 (ref 5–15)
BUN: 15 mg/dL (ref 8–23)
CO2: 22 mmol/L (ref 22–32)
Calcium: 9.3 mg/dL (ref 8.9–10.3)
Chloride: 104 mmol/L (ref 98–111)
Creatinine, Ser: 1.16 mg/dL (ref 0.61–1.24)
GFR calc Af Amer: >60 mL/min (ref >60)
GFR calc non Af Amer: >60 mL/min (ref >60)
Glucose, Bld: 107 mg/dL — ABNORMAL HIGH (ref 70–99)
Potassium: 3.8 mmol/L (ref 3.5–5.1)
Sodium: 134 mmol/L — ABNORMAL LOW (ref 135–145)
Total Bilirubin: 1.3 mg/dL — ABNORMAL HIGH (ref 0.3–1.2)
Total Protein: 7 g/dL (ref 6.5–8.1)

## 2018-08-15 LAB — URINALYSIS, ROUTINE W REFLEX MICROSCOPIC
Bilirubin Urine: NEGATIVE
Glucose, UA: NEGATIVE mg/dL
Hgb urine dipstick: NEGATIVE
Ketones, ur: 5 mg/dL — AB
Leukocytes,Ua: NEGATIVE
Nitrite: NEGATIVE
Protein, ur: NEGATIVE mg/dL
Specific Gravity, Urine: 1.023 (ref 1.005–1.030)
pH: 6 (ref 5.0–8.0)

## 2018-08-15 LAB — LIPASE, BLOOD: Lipase: 23 U/L (ref 11–51)

## 2018-08-15 MED ORDER — METRONIDAZOLE 500 MG PO TABS
500.0000 mg | ORAL_TABLET | Freq: Once | ORAL | Status: AC
  Administered 2018-08-16: 500 mg via ORAL
  Filled 2018-08-15: qty 1

## 2018-08-15 MED ORDER — CEPHALEXIN 500 MG PO CAPS: 500.0000 mg | ORAL_CAPSULE | Freq: Two times a day (BID) | ORAL | 0 refills | Status: AC

## 2018-08-15 MED ORDER — SENNOSIDES-DOCUSATE SODIUM 8.6-50 MG PO TABS: 2.0000 | ORAL_TABLET | Freq: Every day | ORAL | 0 refills | Status: AC

## 2018-08-15 MED ORDER — DICYCLOMINE HCL 20 MG PO TABS: 10.0000 mg | ORAL_TABLET | Freq: Two times a day (BID) | ORAL | 0 refills | Status: DC

## 2018-08-15 MED ORDER — SODIUM CHLORIDE (PF) 0.9 % IJ SOLN
INTRAMUSCULAR | Status: AC
  Administered 2018-08-16: 1 mL
  Filled 2018-08-15: qty 50

## 2018-08-15 MED ORDER — CEPHALEXIN 500 MG PO CAPS
500.0000 mg | ORAL_CAPSULE | Freq: Once | ORAL | Status: AC
  Administered 2018-08-16: 500 mg via ORAL
  Filled 2018-08-15: qty 1

## 2018-08-15 MED ORDER — IOHEXOL 300 MG/ML  SOLN
100.0000 mL | Freq: Once | INTRAMUSCULAR | Status: AC | PRN
  Administered 2018-08-15: 100 mL via INTRAVENOUS

## 2018-08-15 MED ORDER — DICYCLOMINE HCL 10 MG PO CAPS
10.0000 mg | ORAL_CAPSULE | Freq: Once | ORAL | Status: AC
  Administered 2018-08-16: 10 mg via ORAL
  Filled 2018-08-15: qty 1

## 2018-08-15 MED ORDER — METRONIDAZOLE 500 MG PO TABS: 500.0000 mg | ORAL_TABLET | Freq: Two times a day (BID) | ORAL | 0 refills | Status: AC

## 2018-08-15 NOTE — ED Notes (Signed)
Patient transported to CT 

## 2018-08-15 NOTE — ED Triage Notes (Addendum)
Patient arrived POV.  C/O severe cramping pain in "left colon muscle" X 2 months. Patient states it feels like he has to have a BM but, he can't. Patient tried laxatives and enemas that relieved pain for a little while but, came back.   Patient tried to hold off on coming to ED but, pain became intolerable.   10/10 left lower quadrant pain.  Denies n/v, fever, or bleeding.   CT scan scheduled for May 26th Aneurism.   A/Ox4 Ambulatory in triage.

## 2018-08-15 NOTE — ED Provider Notes (Signed)
[facility] DEPT Provider Note   CSN: [id number] Arrival date & time: 08/15/18  1847    History   Chief Complaint No chief complaint on file.   HPI Patrick Adams is a 73 y.o. male.     HPI Patient presents with left abdominal and flank pain. He has multiple medical issues including prior MI, prior aneurysm repair. He also has a history of IBS, but notes that he has had no issues for decades.  However, over the past 2 months he has had episodes of left lower quadrant pain. Pain is described as severe, full sensation, with need for a bowel movement. Symptoms are relieved after having a bowel movement, though this sometimes requires enema or suppository. Patient has tried taking prophylactic stool softener and magnesium, neither of which seem to affect his condition. No vomiting, no nausea, no fever, no chest pain, no dyspnea.  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 ([place]) 01/31/2018   Essential hypertension 01/31/2018   Hyperlipidemia with target LDL less than 70 01/31/2018   Acute ST elevation myocardial infarction (STEMI) involving left anterior descending (LAD) coronary artery ([place]) 01/31/2018   Acute combined systolic and diastolic heart failure ([place]) 01/31/2018   Ischemic cardiomyopathy 01/31/2018    Past Surgical History:  Procedure Laterality  Date   ABDOMINAL AORTIC ANEURYSM REPAIR     CORONARY STENT INTERVENTION N/A 02/03/2018   Procedure: CORONARY STENT INTERVENTION;  Surgeon: Belva Crome, MD;  Location: [facility];  Service: Cardiovascular;  Laterality: N/A;   CORONARY/GRAFT ACUTE MI REVASCULARIZATION N/A 01/31/2018   Procedure: Coronary/Graft Acute MI Revascularization;  Surgeon: Leonie Man, MD;  Location: [facility];  Service: Cardiovascular;  Laterality: N/A;   LEFT HEART CATH AND CORONARY ANGIOGRAPHY N/A 01/31/2018   Procedure: LEFT HEART CATH AND CORONARY ANGIOGRAPHY;  Surgeon: Leonie Man, MD;  Location: [facility];  Service: Cardiovascular;  Laterality: N/A;   LEFT HEART CATH AND CORONARY ANGIOGRAPHY N/A 02/03/2018   Procedure: LEFT HEART CATH AND CORONARY ANGIOGRAPHY;  Surgeon: Belva Crome, MD;  Location: [facility];  Service: Cardiovascular;  Laterality: N/A;   LEFT HEART CATH AND CORONARY ANGIOGRAPHY N/A 03/19/2018   Procedure: LEFT HEART CATH AND CORONARY ANGIOGRAPHY;  Surgeon: Martinique, Peter M, MD;  Location: [facility];  Service: Cardiovascular;  Laterality: N/A;        Home Medications    Prior to Admission medications   Medication Sig Start Date End Date Taking? Authorizing Provider  acetaminophen (TYLENOL) 500 MG tablet Take 1,000 mg by mouth 2 (two) times daily as needed for headache.    Yes [provider]  aspirin EC 81 MG tablet Take 81 mg by mouth at bedtime.   Yes [provider]  cholecalciferol (VITAMIN D3) 25 MCG (1000 UT) tablet Take 1,000 Units by mouth daily.   Yes [provider]  losartan (COZAAR) 25 MG tablet Take 1 tablet (25 mg total) by mouth daily. 05/10/18  Yes Martinique, Peter M, MD  ticagrelor (BRILINTA) 90 MG TABS tablet Take 1 tablet (90 mg total) by mouth 2 (two) times daily. 02/04/18  Yes Cheryln Manly, NP  ezetimibe (ZETIA) 10 MG tablet Take 1 tablet (10 mg total) by mouth daily. Patient not taking:  Reported on 08/15/2018 02/15/18   Lendon Colonel, NP  nitroGLYCERIN (NITROSTAT) 0.4 MG SL tablet Place 1 tablet (0.4 mg total) under the tongue every 5 (five) minutes as needed for chest pain. 03/19/18 03/19/19  Martinique, Peter M, MD  Pitavastatin Calcium (LIVALO) 4 MG TABS Take 1 tablet (4 mg total) by mouth daily. Patient not taking: Reported on 08/15/2018 03/18/18   Lendon Colonel, NP    Family History No family history on file.  Social History Social History   Tobacco Use   Smoking status: Current Every Day Smoker    Packs/day: 0.50    Types: Cigarettes   Smokeless tobacco: Never Used  Substance Use Topics   Alcohol use: No   Drug use: No     Allergies   Statins   Review of Systems Review of Systems  Constitutional:       Per HPI, otherwise negative  HENT:       Per HPI, otherwise negative  Respiratory:       Per HPI, otherwise negative  Cardiovascular:       Per HPI, otherwise negative  Gastrointestinal: Negative for vomiting.  Endocrine:       Negative aside from HPI  Genitourinary:       Neg aside from HPI   Musculoskeletal:       Per HPI, otherwise negative  Skin: Negative.   Neurological: Negative for syncope.     Physical Exam Updated Vital Signs BP 136/85    Pulse 67    Temp 98.7 F (37.1 C) (Oral)    Resp 16    SpO2 96%   Physical Exam Vitals signs and nursing note reviewed.  Constitutional:      General: He is not in acute distress.    Appearance: He is well-developed.  HENT:     Head: Normocephalic and atraumatic.  Eyes:     Conjunctiva/sclera: Conjunctivae normal.  Cardiovascular:     Rate and Rhythm: Normal rate and regular rhythm.  Pulmonary:     Effort: Pulmonary effort is normal. No respiratory distress.     Breath sounds: No stridor.  Abdominal:     General: There is no distension.     Tenderness: There is abdominal tenderness.     Comments: Tenderness, left lower quadrant  Skin:    General: Skin is warm and dry.    Neurological:     Mental Status: He is alert and oriented to person, place, and time.      ED Treatments / Results  Labs (all labs ordered are listed, but only abnormal results are displayed) Labs Reviewed  COMPREHENSIVE METABOLIC PANEL - Abnormal; Notable for the following components:      Result Value   Sodium 134 (*)    Glucose, Bld 107 (*)    AST 12 (*)    Alkaline Phosphatase 127 (*)    Total Bilirubin 1.3 (*)    All other components within normal limits  CBC WITH DIFFERENTIAL/PLATELET - Abnormal; Notable for the following components:   WBC 12.5 (*)    Neutro Abs 10.8 (*)  All other components within normal limits  URINALYSIS, ROUTINE W REFLEX MICROSCOPIC - Abnormal; Notable for the following components:   Ketones, ur 5 (*)    All other components within normal limits  LIPASE, BLOOD    EKG None  Radiology Ct Abdomen Pelvis W Contrast  Result Date: 08/15/2018 CLINICAL DATA:  73 y/o M; 2-3 months of left lower quadrant pain worsening over the last few days. EXAM: CT ABDOMEN AND PELVIS WITH CONTRAST TECHNIQUE: Multidetector CT imaging of the abdomen and pelvis was performed using the standard protocol following bolus administration of intravenous contrast. CONTRAST:  156mL OMNIPAQUE IOHEXOL 300 MG/ML  SOLN COMPARISON:  None. FINDINGS: Lower chest: No acute abnormality. Hepatobiliary: No focal liver abnormality is seen. Status post cholecystectomy. No biliary dilatation. Pancreas: Unremarkable. No pancreatic ductal dilatation or surrounding inflammatory changes. Spleen: Normal in size without focal abnormality. Adrenals/Urinary Tract: Adrenal glands are unremarkable. Kidneys are normal, without renal calculi, focal lesion, or hydronephrosis. Bladder is unremarkable. Stomach/Bowel: Stomach is within normal limits. Appendectomy. Sigmoid diverticulosis with a long segment of wall thickening and surrounding inflammatory changes. No findings of perforation or abscess. No obstructive  or inflammatory changes of the small bowel. Vascular/Lymphatic: Descending thoracic aorta and proximal abdominal aortic aneurysm measuring up to 5.8 cm in diameter at the hiatus (series 4, image 88). Postsurgical changes related to abdominal aortic repair below the level of the renal arteries without recurrent aneurysm. 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 The bypass is widely patent as is the downstream left external iliac artery. 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 Reproductive: Unremarkable. Other: No abdominal wall hernia or abnormality. No abdominopelvic ascites. Anterior abdominal wall mesh hernia repair. Musculoskeletal: No fracture is seen. Moderate lumbar spine levocurvature with apex at L3. L4-5 grade 1 anterolisthesis. Bilateral L4 chronic pars defects. IMPRESSION: 1. Long segment of acute sigmoid colitis/diverticulitis. No evidence for perforation or abscess. 2. Descending thoracic aorta and proximal abdominal aortic aneurysm measuring up to 5.8 cm at the hiatus. Vascular surgery consultation recommended due to increased risk of rupture for AAA >5.5 cm. This recommendation follows ACR consensus guidelines: White Paper of the ACR Incidental Findings Committee II on Vascular Findings. J Am Coll Radiol 2013; 10:789-794. Aortic aneurysm NOS (ICD10-I71.9) 3. 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 Electronically Signed   By: Kristine Garbe M.D.   On: 08/15/2018 23:30    Procedures Procedures (including critical care time)  Medications Ordered in ED Medications  sodium chloride (PF) 0.9 % injection (has no administration in time range)  iohexol (OMNIPAQUE) 300 MG/ML solution 100 mL (100 mLs Intravenous Contrast Given 08/15/18 2311)     Initial Impression / Assessment and Plan / ED Course  I have reviewed the  triage vital signs and the nursing notes.  Pertinent labs & imaging results that were available during my care of the patient were reviewed by me and considered in my medical decision making (see chart for details).    Attack with the patient's daughter after the initial evaluation to obtain additional history.  Not sure that the patient was clinically diagnosed with diverticulitis at some point in the past.     11:40 PM All findings reviewed with the patient, including CT evidence of diverticulitis, and aneurysm. Patient has upcoming surgery follow-up for his previously known aneurysm, will call in the morning to ensure that they are aware  of today's CT results, expedite follow-up. He has no lightheadedness, no chest pain, there is no evidence for rupture. In regards the patient's diverticulitis, there is no evidence for abscess, no perforation.  Line patient has prior allergic reaction to Cipro, will start a new course of medication, be discharged with outpatient GI follow-up as well as vascular surgery.  Final Clinical Impressions(s) / ED Diagnoses  Acute diverticulitis   Carmin Muskrat, MD 08/15/18 2341

## 2018-08-15 NOTE — ED Notes (Signed)
Spoke to lab about CMP and Lipase, lab states they had issues with the machine and will run it now.

## 2018-08-15 NOTE — Discharge Instructions (Signed)
As discussed, today's evaluation has demonstrated the presence of diverticulitis. Also, your CT has additional information regarding your aneurysm. It is very important that you schedule follow-up with your surgeon, call tomorrow to ensure that they are aware of today's CT findings.  Please take your medication as directed and return here for concerning changes in your condition.

## 2018-10-10 ENCOUNTER — Other Ambulatory Visit: Payer: Self-pay | Admitting: Cardiology

## 2018-10-27 ENCOUNTER — Telehealth: Payer: Self-pay | Admitting: Cardiology

## 2018-10-27 NOTE — Telephone Encounter (Signed)
Pt reports palpitations since Saturday. Also reports lightheadedness, nausea and shortness of breath (shortness of breath present since starting Brilinta several months ago). Reports pressure in chest froom flluttering, states its hard to describe. BP's 120's-140's over 60's-90's, HR 60's-90's.   pls advise, tx

## 2018-10-27 NOTE — Telephone Encounter (Signed)
Patient to be seen by Almyra Deforest, PA 1:30 10/28/2018 for evaluation

## 2018-10-27 NOTE — Telephone Encounter (Signed)
Phoned patient, instructed to come to [facility] office for office visit tomorrow, check in at 1315, verbal understanding received, no further questions.

## 2018-10-27 NOTE — Telephone Encounter (Signed)
New Message   Patient c/o Palpitations:  High priority if patient c/o lightheadedness, shortness of breath, or chest pain  1) How long have you had palpitations/irregular HR/ Afib? Are you having the symptoms now? Since Saturday, still happening now  2) Are you currently experiencing lightheadedness, SOB or CP? Yes  3) Do you have a history of afib (atrial fibrillation) or irregular heart rhythm? No  4) Have you checked your BP or HR? (document readings if available): 127/85 68 (currently), previously: Sunday 154/89 94, Monday 146/95 65, Tuesday 137/83 62, 124/77 66, 133/86   5) Are you experiencing any other symptoms? Nausea, SOB, lightheadedness, and nervousness. 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

## 2018-10-28 ENCOUNTER — Telehealth: Payer: Self-pay | Admitting: *Deleted

## 2018-10-28 ENCOUNTER — Other Ambulatory Visit: Payer: Self-pay

## 2018-10-28 ENCOUNTER — Encounter (INDEPENDENT_AMBULATORY_CARE_PROVIDER_SITE_OTHER): Payer: Self-pay

## 2018-10-28 ENCOUNTER — Ambulatory Visit: Payer: Medicare HMO | Admitting: Physician Assistant

## 2018-10-28 ENCOUNTER — Other Ambulatory Visit: Payer: Self-pay | Admitting: Cardiology

## 2018-10-28 VITALS — BP 122/79 | HR 75 | Temp 97.9°F | Ht 72.0 in | Wt 163.2 lb

## 2018-10-28 DIAGNOSIS — I714 Abdominal aortic aneurysm, without rupture, unspecified: Secondary | ICD-10-CM

## 2018-10-28 DIAGNOSIS — I2510 Atherosclerotic heart disease of native coronary artery without angina pectoris: Secondary | ICD-10-CM

## 2018-10-28 DIAGNOSIS — I10 Essential (primary) hypertension: Secondary | ICD-10-CM | POA: Diagnosis not present

## 2018-10-28 DIAGNOSIS — R002 Palpitations: Secondary | ICD-10-CM

## 2018-10-28 NOTE — Telephone Encounter (Signed)
[employer] to ship 30 day cardiac event monitor to patients home.  Instructions reviewed briefly as they are included in the monitor kit.

## 2018-10-28 NOTE — Patient Instructions (Addendum)
Medication Instructions:   Your physician recommends that you continue on your current medications as directed. Please refer to the Current Medication list given to you today.  If you need a refill on your cardiac medications before your next appointment, please call your pharmacy.   Lab work: NONE ordered at this time of appointment   If you have labs (blood work) drawn today and your tests are completely normal, you will receive your results only by: Marland Kitchen MyChart Message (if you have MyChart) OR . A paper copy in the mail If you have any lab test that is abnormal or we need to change your treatment, we will call you to review the results.  Testing/Procedures: Your physician has recommended that you wear an event monitor for 30 days. Event monitors are medical devices that record the heart's electrical activity. Doctors most often [place] these monitors to diagnose arrhythmias. Arrhythmias are problems with the speed or rhythm of the heartbeat. The monitor is a small, portable device. You can wear one while you do your normal daily activities. This is usually used to diagnose what is causing palpitations/syncope (passing out).    Follow-Up: At [facility], you and your health needs are our priority.  As part of our continuing mission to provide you with exceptional heart care, we have created designated Provider Care Teams.  These Care Teams include your primary Cardiologist (physician) and Advanced Practice Providers (APPs -  Physician Assistants and Nurse Practitioners) who all work together to provide you with the care you need, when you need it. . You will need a follow up appointment in 3-4 months with Glenetta Hew, MD  Any Other Special Instructions Will Be Listed Below (If Applicable).

## 2018-10-28 NOTE — Progress Notes (Signed)
Cardiology Office Note    Date:  10/30/2018   ID:  Patrick Adams, DOB [birth year], MRN [id number]  PCP:  [name], MD  Cardiologist:  Dr. Ellyn Hack   Chief Complaint  Patient presents with  . Follow-up    seen for Dr. Ellyn Hack.     History of Present Illness:  Patrick Adams is a 73 y.o. male with PMH of CAD, HTN, and history of AAA.  Patient had a history of PCI to mid LAD on 01/31/2018 followed by staged intervention to mid RCA on 02/03/2018.  Echocardiogram obtained in November showed EF 40 to 45% with anterior, anteroseptal and apical hypokinesis.  He is intolerant to statins.  Due to recurrent anginal symptom, he went back to the Cath Lab on 03/19/2018 which showed widely patent LAD, PDA and RCA stents, 70% distal LAD lesion, 65% OM1 lesion, EF 55 to 65%.  No culprit lesion was identified.  He was continued on medical therapy.  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  CT angiogram of the chest obtained on 08/24/2018 showed upper abdominal aorta measuring at 5.7 x 5.1 cm.  He underwent renal artery Doppler, carotid Doppler and lower extremity arterial Doppler on 10/07/2018 which did not reveal any significant disease or obstruction.  He was last seen by Dr. Sammuel Hines of vascular surgery on the same day who mentioned his thoracic aortic aneurysm was measuring as large as 6.2 cm, therefore he recommended FEVAR in November 2020 once he finished the one year course of dual antiplatelet therapy.  He denies any chest pain recently.  He has no obvious abdominal pain either.  He still has some shortness of breath associated with Brilinta however this is not bothering him too much and he preferred to be on Brilinta at this point.  Since last Saturday until yesterday, he had a prolonged episode of palpitation.  He noticed his heart rate was in the high 90s even when he is sitting down.  The palpitations started suddenly and he did have some dizziness.   Yesterday afternoon the palpitation suddenly stopped.  EKG today still shows normal sinus rhythm, however I suspect he had a prolonged episode of atrial fibrillation.  We discussed the various options include event monitor versus [employer] versus [employer] watch.  For the time being I recommended a 30-day event monitor.  If the event monitor is negative, I would highly recommend him to follow-up with [employer] as outpatient.  He does have a android smart phone.    Past Medical History:  Diagnosis Date  . Heart attack ([place])   . Hemochromatosis   . History of abdominal aortic aneurysm (AAA) repair    With thoracic aortic aneurysm being monitored  . Hypertension     Past Surgical History:  Procedure Laterality Date  . ABDOMINAL AORTIC ANEURYSM REPAIR    . CORONARY STENT INTERVENTION N/A 02/03/2018   Procedure: CORONARY STENT INTERVENTION;  Surgeon: Belva Crome, MD;  Location: [place] [facility];  Service: Cardiovascular;  Laterality: N/A;  . CORONARY/GRAFT ACUTE MI REVASCULARIZATION N/A 01/31/2018   Procedure: Coronary/Graft Acute MI Revascularization;  Surgeon: Leonie Man, MD;  Location: [place] [facility];  Service: Cardiovascular;  Laterality: N/A;  . LEFT HEART CATH AND CORONARY ANGIOGRAPHY N/A 01/31/2018   Procedure: LEFT HEART CATH AND CORONARY ANGIOGRAPHY;  Surgeon: Leonie Man, MD;  Location: [place] [facility];  Service: Cardiovascular;  Laterality: N/A;  . LEFT HEART CATH AND CORONARY  ANGIOGRAPHY N/A 02/03/2018   Procedure: LEFT HEART CATH AND CORONARY ANGIOGRAPHY;  Surgeon: Belva Crome, MD;  Location: [place] [facility];  Service: Cardiovascular;  Laterality: N/A;  . LEFT HEART CATH AND CORONARY ANGIOGRAPHY N/A 03/19/2018   Procedure: LEFT HEART CATH AND CORONARY ANGIOGRAPHY;  Surgeon: Martinique, Peter M, MD;  Location: [place] [facility];  Service: Cardiovascular;  Laterality: N/A;    Current Medications: Outpatient Medications Prior to Visit  Medication Sig Dispense Refill   . acetaminophen (TYLENOL) 500 MG tablet Take 1,000 mg by mouth 2 (two) times daily as needed for headache.     Marland Kitchen aspirin EC 81 MG tablet Take 81 mg by mouth at bedtime.    Marland Kitchen BRILINTA 90 MG TABS tablet TAKE 1 TABLET BY MOUTH TWICE A DAY 60 tablet 2  . cholecalciferol (VITAMIN D3) 25 MCG (1000 UT) tablet Take 1,000 Units by mouth daily.    Marland Kitchen ezetimibe (ZETIA) 10 MG tablet Take 1 tablet (10 mg total) by mouth daily. 30 tablet 11  . losartan (COZAAR) 25 MG tablet TAKE 1 TABLET BY MOUTH EVERY DAY 90 tablet 2  . nitroGLYCERIN (NITROSTAT) 0.4 MG SL tablet Place 1 tablet (0.4 mg total) under the tongue every 5 (five) minutes as needed for chest pain. 100 tablet 3  . Pitavastatin Calcium (LIVALO) 4 MG TABS Take 1 tablet (4 mg total) by mouth daily. 30 tablet   . dicyclomine (BENTYL) 20 MG tablet Take 0.5 tablets (10 mg total) by mouth 2 (two) times daily for 5 days. 10 tablet 0   Facility-Administered Medications Prior to Visit  Medication Dose Route Frequency Provider Last Rate Last Dose  . 0.9 %  sodium chloride infusion    Continuous PRN Leonie Man, MD 10 mL/hr at 01/31/18 1912 10 mL/hr at 01/31/18 1912  . 0.9 %  sodium chloride infusion    Continuous PRN Leonie Man, MD 100 mL/hr at 01/31/18 1912    . fentaNYL (SUBLIMAZE) injection    PRN Leonie Man, MD   25 mcg at 01/31/18 1933  . iohexol (OMNIPAQUE) 350 MG/ML injection    PRN Leonie Man, MD   165 mL at 01/31/18 2019  . lidocaine (PF) (XYLOCAINE) 1 % injection    PRN Leonie Man, MD   2 mL at 01/31/18 1914  . midazolam (VERSED) injection    PRN Leonie Man, MD   1 mg at 01/31/18 1933  . morphine 2 MG/ML injection    PRN Leonie Man, MD   2 mg at 01/31/18 2030  . morphine 2 MG/ML injection    PRN Leonie Man, MD   2 mg at 01/31/18 2017     Allergies:   Statins   Social History   Socioeconomic History  . Marital status: Single    Spouse name: Not on file  . Number of children: Not on file  . Years  of education: Not on file  . Highest education level: Not on file  Occupational History  . Not on file  Social Needs  . Financial resource strain: Not on file  . Food insecurity    Worry: Not on file    Inability: Not on file  . Transportation needs    Medical: Not on file    Non-medical: Not on file  Tobacco Use  . Smoking status: Current Every Day Smoker    Packs/day: 0.50    Types: Cigarettes  . Smokeless tobacco: Never Used  Substance and Sexual Activity  . Alcohol use: No  . Drug use: No  . Sexual activity: Not on file  Lifestyle  . Physical activity    Days per week: Not on file    Minutes per session: Not on file  . Stress: Not on file  Relationships  . Social Herbalist on phone: Not on file    Gets together: Not on file    Attends religious service: Not on file    Active member of club or organization: Not on file    Attends meetings of clubs or organizations: Not on file    Relationship status: Not on file  Other Topics Concern  . Not on file  Social History Narrative  . Not on file     Family History:  The patient's family history is not on file.   ROS:   Please see the history of present illness.    ROS All other systems reviewed and are negative.   PHYSICAL EXAM:   VS:  BP 122/79   Pulse 75   Temp 97.9 F (36.6 C) (Temporal)   Ht 6' (1.829 m)   Wt 163 lb 3.2 oz (74 kg)   SpO2 (!) 75%   BMI 22.13 kg/m    GEN: Well nourished, well developed, in no acute distress  HEENT: normal  Neck: no JVD, carotid bruits, or masses Cardiac: RRR; no murmurs, rubs, or gallops,no edema  Respiratory:  clear to auscultation bilaterally, normal work of breathing GI: soft, nontender, nondistended, + BS MS: no deformity or atrophy  Skin: warm and dry, no rash Neuro:  Alert and Oriented x 3, Strength and sensation are intact Psych: euthymic mood, full affect  Wt Readings from Last 3 Encounters:  10/28/18 163 lb 3.2 oz (74 kg)  06/04/18 180 lb (81.6  kg)  04/05/18 180 lb 6.4 oz (81.8 kg)      Studies/Labs Reviewed:   EKG:  EKG is ordered today.  The ekg ordered today demonstrates normal sinus rhythm with mild T wave inversion in the inferolateral leads.  Recent Labs: 08/15/2018: ALT 12; BUN 15; Creatinine, Ser 1.16; Hemoglobin 14.8; Platelets 253; Potassium 3.8; Sodium 134   Lipid Panel    Component Value Date/Time   CHOL 194 02/01/2018 0239   TRIG 156 (H) 02/01/2018 0239   HDL 34 (L) 02/01/2018 0239   CHOLHDL 5.7 02/01/2018 0239   VLDL 31 02/01/2018 0239   LDLCALC 129 (H) 02/01/2018 0239    Additional studies/ records that were reviewed today include:   Echo 02/01/2018 LV EF: 40% -   45% Study Conclusions  - Left ventricle: The cavity size was normal. Wall thickness was   increased in a pattern of moderate LVH. Systolic function was   mildly to moderately reduced. The estimated ejection fraction was in the range of 40% to 45%. Anterior, anteroseptal and apical   severe hypokinesis to akinesis. No apical thrombus with Definity   contrast. Doppler parameters are consistent with diastolic   dysfunction and indeterminate LV filling pressure. - Aortic valve: Sclerosis without stenosis. There was trivial   regurgitation. - Mitral valve: Mildly thickened leaflets . There was trivial   regurgitation. - Left atrium: The atrium was normal in size. - Right atrium: The atrium was mildly dilated. - Inferior vena cava: The vessel was normal in size. The   respirophasic diameter changes were in the normal range (>= 50%),   consistent with normal central venous pressure.  Impressions:  -  LVEF 40-45%, anterior, anteroseptal and apical severe hypokinesis   to akinesis suggestive of LAD territory ischemia/infarct,   diastolic dysfunction with indeterminate LV filling pressure,   aortic sclerosis with trivial AI, trivial MR, normal LA size,   mild RAE, normal IVC.    Cath 03/19/2018  Non-stenotic Prox LAD to Mid LAD lesion  was previously treated.  Non-stenotic Prox LAD lesion was previously treated.  Ost 1st Mrg lesion is 65% stenosed.  Previously placed Post Atrio stent (unknown type) is widely patent.  Previously placed Mid RCA-2 stent (unknown type) is widely patent.  Previously placed Mid RCA-1 stent (unknown type) is widely patent.  Previously placed Prox RCA to Mid RCA stent (unknown type) is widely patent.  Dist LAD lesion is 70% stenosed.  The left ventricular systolic function is normal.  LV end diastolic pressure is normal.  The left ventricular ejection fraction is 55-65% by visual estimate.   1. No significant obstructive CAD. Moderate disease in very distal LAD. Continued patency of all stents. 2. Good LV function 3. Normal LVEDP  Plan: LV function has improved significantly. I see no angiographic reason for recurrent chest pain. Continue medical management.    ASSESSMENT:    1. Palpitations   2. Coronary artery disease involving native coronary artery of native heart without angina pectoris   3. Essential hypertension   4. AAA (abdominal aortic aneurysm) without rupture (HCC)      PLAN:  In order of problems listed above:  1. Palpitation: I suspect he had several days of atrial fibrillation.  I recommend a 30-day event monitor to further evaluate.  The palpitation spontaneously resolved yesterday afternoon.  2. CAD: Denies any recent anginal symptoms.  Continue aspirin and Brilinta.  3. Hypertension: Blood pressure stable  4. AAA: Followed by vascular surgery, abdominal aortic aneurysm dilated to 6.2 cm.  Planning to undergo FEVAR in November 2020 after he finished 1 year course of dual antiplatelet therapy.  He will need to hold aspirin and Brilinta for 5 days prior to the procedure.   Medication Adjustments/Labs and Tests Ordered: Current medicines are reviewed at length with the patient today.  Concerns regarding medicines are outlined above.  Medication changes,  Labs and Tests ordered today are listed in the Patient Instructions below. Patient Instructions  Medication Instructions:   Your physician recommends that you continue on your current medications as directed. Please refer to the Current Medication list given to you today.  If you need a refill on your cardiac medications before your next appointment, please call your pharmacy.   Lab work: NONE ordered at this time of appointment   If you have labs (blood work) drawn today and your tests are completely normal, you will receive your results only by: Marland Kitchen MyChart Message (if you have MyChart) OR . A paper copy in the mail If you have any lab test that is abnormal or we need to change your treatment, we will call you to review the results.  Testing/Procedures: Your physician has recommended that you wear an event monitor for 30 days. Event monitors are medical devices that record the heart's electrical activity. Doctors most often [place] these monitors to diagnose arrhythmias. Arrhythmias are problems with the speed or rhythm of the heartbeat. The monitor is a small, portable device. You can wear one while you do your normal daily activities. This is usually used to diagnose what is causing palpitations/syncope (passing out).    Follow-Up: At [facility], you and your health needs are  our priority.  As part of our continuing mission to provide you with exceptional heart care, we have created designated Provider Care Teams.  These Care Teams include your primary Cardiologist (physician) and Advanced Practice Providers (APPs -  Physician Assistants and Nurse Practitioners) who all work together to provide you with the care you need, when you need it. . You will need a follow up appointment in 3-4 months with Glenetta Hew, MD  Any Other Special Instructions Will Be Listed Below (If Applicable).       Hilbert Corrigan, [place]  10/30/2018 7:28 PM    [facility] [place], [place], [place]  [address] Phone: [phone]; Fax: [phone]

## 2018-10-30 ENCOUNTER — Encounter: Payer: Self-pay | Admitting: Physician Assistant

## 2018-10-30 HISTORY — PX: OTHER SURGICAL HISTORY: SHX169

## 2018-11-03 ENCOUNTER — Ambulatory Visit (INDEPENDENT_AMBULATORY_CARE_PROVIDER_SITE_OTHER): Payer: Medicare HMO

## 2018-11-03 DIAGNOSIS — R002 Palpitations: Secondary | ICD-10-CM | POA: Diagnosis not present

## 2018-11-22 ENCOUNTER — Telehealth: Payer: Self-pay | Admitting: Physician Assistant

## 2018-11-22 NOTE — Telephone Encounter (Signed)
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

## 2018-11-27 ENCOUNTER — Telehealth: Payer: Self-pay | Admitting: Medical

## 2018-11-27 NOTE — Telephone Encounter (Signed)
   Notified by [employer] heart monitor company that patient had a 4 second pause around 9:50am. He was reported to be in NSR both before and after the reported pause. Patient was contacted and reported that he got a notification from his device around that time that there was an issue with lead placement. No complaints of dizziness, lightheadedness, syncope, or palpitations at the time of the reported event. Possible this was related to a lead placement issue. Patient is not on any AV nodal blocking agents. At this point, favor continued heart monitoring without changes in medications/follow-up plan. Scheduled to see Dr. Ellyn Hack 12/13/2018.   Patient in agreement with plan to continue monitoring remotely.   Abigail Butts, PA-C 11/27/18; 11:34 AM

## 2018-12-13 ENCOUNTER — Other Ambulatory Visit: Payer: Self-pay

## 2018-12-13 ENCOUNTER — Encounter: Payer: Self-pay | Admitting: Cardiology

## 2018-12-13 ENCOUNTER — Ambulatory Visit: Payer: Medicare HMO | Admitting: Cardiology

## 2018-12-13 VITALS — BP 165/87 | HR 64 | Temp 98.2°F | Ht 72.0 in | Wt 164.0 lb

## 2018-12-13 DIAGNOSIS — E785 Hyperlipidemia, unspecified: Secondary | ICD-10-CM | POA: Diagnosis not present

## 2018-12-13 DIAGNOSIS — I25119 Atherosclerotic heart disease of native coronary artery with unspecified angina pectoris: Secondary | ICD-10-CM

## 2018-12-13 DIAGNOSIS — Z720 Tobacco use: Secondary | ICD-10-CM | POA: Diagnosis not present

## 2018-12-13 DIAGNOSIS — I10 Essential (primary) hypertension: Secondary | ICD-10-CM | POA: Diagnosis not present

## 2018-12-13 DIAGNOSIS — Z8679 Personal history of other diseases of the circulatory system: Secondary | ICD-10-CM | POA: Diagnosis not present

## 2018-12-13 DIAGNOSIS — I255 Ischemic cardiomyopathy: Secondary | ICD-10-CM | POA: Diagnosis not present

## 2018-12-13 DIAGNOSIS — F17210 Nicotine dependence, cigarettes, uncomplicated: Secondary | ICD-10-CM | POA: Diagnosis not present

## 2018-12-13 DIAGNOSIS — I712 Thoracic aortic aneurysm, without rupture, unspecified: Secondary | ICD-10-CM

## 2018-12-13 MED ORDER — TICAGRELOR 60 MG PO TABS: 60.0000 mg | ORAL_TABLET | Freq: Two times a day (BID) | ORAL | 11 refills | Status: DC

## 2018-12-13 NOTE — Patient Instructions (Addendum)
Medication Instructions:  - restart taking zetia 10 mg daily    - okay to hold -BRILINTA FOR AAA PROCEDURES ON 02/04/2019  HOLD FOR 5 DAYS THEN DECREASE TO 60 MG  TWICE A DAY -  (NEW PRESCRIPTION SENT TO PHARMACY   If you need a refill on your cardiac medications before your next appointment, please call your pharmacy.   Lab work:  If you have labs (blood work) drawn today and your tests are completely normal, you will receive your results only by: Marland Kitchen MyChart Message (if you have MyChart) OR . A paper copy in the mail If you have any lab test that is abnormal or we need to change your treatment, we will call you to review the results.  Testing/Procedures:   Follow-Up: At [facility], you and your health needs are our priority.  As part of our continuing mission to provide you with exceptional heart care, we have created designated Provider Care Teams.  These Care Teams include your primary Cardiologist (physician) and Advanced Practice Providers (APPs -  Physician Assistants and Nurse Practitioners) who all work together to provide you with the care you need, when you need it. You will need a follow up appointment in  5  Months-FEB 2021.  Please call our office 2 months in advance to schedule this appointment.  You may see Glenetta Hew, MD   Your physician recommends that you schedule a follow-up appointment in:2-3 WEEKS CVRR - need to discuss cholesterol  Options & Blood Pressure  Any Other Special Instructions Will Be Listed Below (If Applicable).

## 2018-12-13 NOTE — Progress Notes (Signed)
PCP: [name], MD  Clinic Note: Chief Complaint  Patient presents with  . Follow-up    Monitor results, palpitations  . Coronary Artery Disease  . Bradycardia    Sinus pauses noted on monitor    HPI: Patrick Adams is a 73 y.o. male with a PMH (CAD, HTN, AAA-status post repair with a stable TAA, as well as history of hemochromatosis with phlebotomy) who presents today for 2 month f/u to discuss results of event monitor.   AAA repair in 2008 -has some residual TAA - followed by Dr. Rebecca Eaton [facility], annual scans  CAD history: Anterior-inferior ST elevation MI on January 31, 2018 -> cath revealed severe two-vessel disease with occluded p-m LAD as well as mRCA 80% & RPAV 90%.  01/31/2018: DES PCI LAD as initial STEMI PCI  Staged PCI RCA and RPA V on 02/03/2018  Patent by relook cath in December 2019 (for symptoms concerning for unstable angina-chest pressure/tightness/aching radiating to jaw lasting 45 minutes)--> LV gram EF up to 50 to 55% from 40 to 45%.  Unable to tolerate beta-blocker because of bradycardia (and now pauses)  HYPERLIPIDEMIA -, statin intolerant -- [facility] Pharm-D started Livalo -- NOT TAKING & NOT TAKING ZETIA  Kenderick G Floyd was last seen on 10/28/2018 by Almyra Deforest, PA - noted palpitations. ?? Afib -- Event monitor ordered.  Recent Hospitalizations: none  Studies Personally Reviewed - (if available, images/films reviewed: From Epic Chart or Care Everywhere)  01/31/2018 Cath-PCI  -(anterior inferior STEMI) -> pLAD 60% - mLAD 100% (PCI-Synergy DES 2.75 mm at 38 mm--4.1-3.6-3.0 mm tapered post dilation), p-m RCA 55%, mRCA 85%, mRCA 40% (diffuse segmental disease), RPAV 90% (ulcerated), OM1 65%.  EF 35-45%, moderately elevated LVEDP  Acute combined systolic and diastolic heart failure, ischemic cardiomyopathy  02/03/2018 staged PCI p-mRCA (Synergy DES 3.5 mm x 32 mm--4.0 mm covering entire diseased segment from  proximal 55 through the 85 and 40% lesions  in the mid vessel), RPAV (Synergy DES 2.5 mm x 12 mm--3.0 mm)  TTE 02/01/2018: Moderate LVH.  EF 40 to 45%.  Anterior, anteroseptal and apical severe hypokinesis to akinesis.  Indeterminate LV filling pressures.  Aortic sclerosis, no stenosis.  Cath 12/ 20/2019: Patent stent in p-mLAD, patent p-m RCA stent and RPAV stent.  Distal LAD 70%, OM1 65%.  EF 55-60%.  Cardiac event monitor 8 5-9 3, 2020: Baseline sinus rhythm with 1 degree AVB.  PVCs noted.  Average heart rate 73 bpm.  3 critical: 1 serious and 8 stable events noted: Critical-4.0-second pauses x3, serious 1 run 6 beat PVCs (NSVT), 3 pauses of 3.2 to 3.5 seconds.  Pauses do have P waves that appear to be blocked on some occasions.  Both junctional escape and normal sinus beats after pauses.  Interval History: Patrick Adams is here today to follow-up after his monitor.  He tells me that the palpitations seem to be better he is not having any real significant palpitations now.  He did not seem to notice any abnormal symptoms at all when he was wearing the monitor and had the 4-second pauses.  No syncope or near syncope type symptoms.  No dizziness or wooziness.  He does not notice any issues with recurrence of her incompetence type symptoms such as exercise intolerance.  We have not been able to do beta-blockers because of bradycardia and now with pauses we essentially cannot do that. He has been having quite a bit of migraine headaches off and on sometimes  associated with him then getting a high blood pressure.  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  No blurred vision or dizziness except when he is having a headache. He tells me that usually his blood pressure at home is better than it is today but he is in the tail end of a headache today.  I do not think he ever started taking Livalo or Zetia.  He simply just indicated that he will take statins.  He was not interested in  injection medicines either at that time.  He is a little more understanding that this probably unnecessary consideration since he has thoracic and abdominal aortic issues as well as coronary disease now.  Thankfully, he has not had any further resting exertional chest pain or pressure.  He is try to stay active but really does not get as much exercise as he would like.  He fell off of a stool back in July and banged himself up a little bit and.  He is finally now starting to get some mobility back.  No PND, orthopnea or edema.  He has a little bit of shortness of breath associated with taking Brilinta, but very short-lived.  No lightheadedness, dizziness, weakness or syncope/near syncope. No TIA/amaurosis fugax symptoms. No melena, hematochezia, hematuria, or epstaxis. No claudication.  ROS: A comprehensive was performed. Review of Systems  Constitutional: Positive for weight loss (He actually has not been trying to lose weight.  He says he is eating well, but just has lost weight.  No thin stools.  No bleeding etc.). Negative for malaise/fatigue.  HENT: Negative for congestion and nosebleeds.   Eyes: Positive for blurred vision (With migraines) and photophobia (With migraines).  Respiratory: Positive for shortness of breath (Brief episode shortly after taking Brilinta). Negative for cough and wheezing.   Gastrointestinal: Negative for abdominal pain, heartburn and melena.       Pencil thin stools  Musculoskeletal: Positive for joint pain (Mostly healing bruises from his fall).  Neurological: Positive for headaches (Migraine headaches). Negative for dizziness and focal weakness.  Psychiatric/Behavioral: Negative for depression and memory loss. The patient is not nervous/anxious and does not have insomnia.   All other systems reviewed and are negative.  The patient does not have symptoms concerning for COVID-19 infection (fever, chills, cough, or new shortness of breath).  The patient is  practicing social distancing.   COVID-19 Education: The signs and symptoms of COVID-19 were discussed with the patient and how to seek care for testing (follow up with PCP or arrange E-visit).   The importance of social distancing was discussed today.     I have reviewed and (if needed) personally updated the patient's problem list, medications, allergies, past medical and surgical history, social and family history.   Past Medical History:  Diagnosis Date  . CAD S/P PCI 01/2018   a) 01/31/2018: p-mLAD 60-100% (DES PCI with 2.75 mm x 38 mm Synergy DES - 4.0-3.6.3.0 mm tapered post-dilation); b) p-mRCA 55%-85%-40% - Synergy DES 3.5 mm x 32 mm -> 4.0 mm; & RPAV 90% --> Synergy DES 2.5 mm x 12 mm -> 3.0 mm;; c) stents patent on re-look Cath 03/19/2018  . Hemochromatosis   . History of abdominal aortic aneurysm (AAA) repair 2008   With thoracic aortic aneurysm being monitored  . Hypertension   . Ischemic cardiomyopathy 01/31/2018   Initial post STEMI EF by echo 40 to 45% --> by cath  1 month later LV gram showed EF 55 to 60%.  . ST elevation (STEMI) myocardial infarction involving left anterior descending coronary artery ([place]) 01/31/2018   Found to have pLAD 60%-mLAD 100% (DES PCI), distal LAD diffuse 60-70% (med Rx), OM1 65%; also signifiicant p-m RCA & RPAV disease  --> Initial EF ~45% with elevated LVEDP  . 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 ABDOMINAL AORTIC ANEURYSM REPAIR  2008  . CORONARY STENT INTERVENTION N/A 02/03/2018   Procedure: CORONARY STENT INTERVENTION;  Surgeon: Belva Crome, MD;  Location: [facility] INVASIVE CV LAB;;   staged PCI p-mRCA (Synergy DES 3.5 mm x 32 mm--4.0 mm covering entire diseased segment from  proximal 55 through the 85 and 40% lesions in the mid vessel), RPAV (Synergy DES 2.5 mm x 12 mm--3.0 mm)  . CORONARY/GRAFT ACUTE MI REVASCULARIZATION N/A 01/31/2018   Procedure:  Coronary/Graft Acute MI Revascularization;  Surgeon: Leonie Man, MD;  Location: [place] CV LAB;; ANTERIOR-INFERIOR STEMI: pLAD 60% - mLAD 100% (PCI-Synergy DES 2.75 mm at 38 mm--4.1-3.6-3.0 mm tapered post dilation)  . LEFT HEART CATH AND CORONARY ANGIOGRAPHY N/A 01/31/2018   Procedure: LEFT HEART CATH AND CORONARY ANGIOGRAPHY;  Surgeon: Leonie Man, MD;  Location: [facility] INVASIVE CV LAB;;; (anterior inferior STEMI) -> pLAD 60% - mLAD 100% (DES PCI); p-m RCA 55%, mRCA 85%, mRCA 40% (diffuse segmental disease), RPAV 90% (ulcerated), OM1 65%.  EF 35-45%, moderately elevated LVEDP --acute combined systolic and diastolic heart failure, ischemic cardiomyopathy  . LEFT HEART CATH AND CORONARY ANGIOGRAPHY N/A 02/03/2018   Procedure: LEFT HEART CATH AND CORONARY ANGIOGRAPHY;  Surgeon: Belva Crome, MD;  Location: [place] CV LAB;  Service: Cardiovascular;  Laterality: N/A;  . LEFT HEART CATH AND CORONARY ANGIOGRAPHY N/A 03/19/2018   Procedure: LEFT HEART CATH AND CORONARY ANGIOGRAPHY;  Surgeon: Martinique, Peter M, MD;  Location: [place] CV LAB;  Service: Cardiovascular;  Patent stent in p-mLAD, patent p-m RCA stent and RPAV stent.  Distal LAD 70%, OM1 65%.  EF 55-60%.  . TRANSTHORACIC ECHOCARDIOGRAM  02/01/2018   (Insetting of anterior-inferior STEMI) --> moderate LVH.  EF 40 to 45%.  Anterior, anteroseptal and apical severe hypokinesis to akinesis.  Indeterminate LV filling pressures.  Aortic sclerosis, no stenosis.   01/31/2018 - LAD, 02/03/2018 mRCA & RPAV     Current Meds  Medication Sig  . acetaminophen (TYLENOL) 500 MG tablet Take 1,000 mg by mouth 2 (two) times daily as needed for headache.   Marland Kitchen aspirin EC 81 MG tablet Take 81 mg by mouth at bedtime.  . cholecalciferol (VITAMIN D3) 25 MCG (1000 UT) tablet Take 1,000 Units by mouth daily.  Marland Kitchen ezetimibe (ZETIA) 10 MG tablet Take 1 tablet (10 mg total) by mouth daily.  Marland Kitchen losartan (COZAAR) 25 MG tablet TAKE 1 TABLET BY MOUTH EVERY DAY  .  nitroGLYCERIN (NITROSTAT) 0.4 MG SL tablet Place 1 tablet (0.4 mg total) under the tongue every 5 (five) minutes as needed for chest pain.  . Pitavastatin Calcium (LIVALO) 4 MG TABS Take 1 tablet (4 mg total) by mouth daily.  . [DISCONTINUED] BRILINTA 90 MG TABS tablet TAKE 1 TABLET BY MOUTH TWICE A DAY    Allergies  Allergen Reactions  . Statins     Joint pain.    Social History   Tobacco Use  . Smoking status: Current Every Day Smoker    Packs/day: 0.50  Types: Cigarettes  . Smokeless tobacco: Never Used  Substance Use Topics  . Alcohol use: No  . Drug use: No   Social History   Social History Narrative  . Not on file    family history is not on file.  Wt Readings from Last 3 Encounters:  12/13/18 164 lb (74.4 kg)  10/28/18 163 lb 3.2 oz (74 kg)  06/04/18 180 lb (81.6 kg)    PHYSICAL EXAM BP (!) 165/87   Pulse 64   Temp 98.2 F (36.8 C)   Ht 6' (1.829 m)   Wt 164 lb (74.4 kg)   SpO2 97%   BMI 22.24 kg/m  Physical Exam  Constitutional: He is oriented to person, place, and time. He appears well-developed and well-nourished. No distress.  Relatively healthy-appearing.  Well-groomed.  HENT:  Head: Normocephalic and atraumatic.  Wearing mask  Neck: Normal range of motion. Neck supple. No hepatojugular reflux and no JVD present. Carotid bruit is not present.  Cardiovascular: Normal rate, regular rhythm, normal heart sounds and normal pulses.  Occasional extrasystoles are present. PMI is not displaced. Exam reveals no gallop and no friction rub.  No murmur heard. Pulmonary/Chest: Effort normal and breath sounds normal. No respiratory distress. He has no wheezes. He has no rales.  Abdominal: Soft. Bowel sounds are normal. He exhibits no distension. There is no abdominal tenderness. There is no rebound.  Musculoskeletal: Normal range of motion.        General: No edema.  Neurological: He is alert and oriented to person, place, and time.  Psychiatric: He has a  normal mood and affect. His behavior is normal. Judgment and thought content normal.    Adult ECG Report NONE  Other studies Reviewed: Additional studies/ records that were reviewed today include:  Recent Labs:  No labs since Nov 2019 Lab Results  Component Value Date   CHOL 194 02/01/2018   HDL 34 (L) 02/01/2018   LDLCALC 129 (H) 02/01/2018   TRIG 156 (H) 02/01/2018   CHOLHDL 5.7 02/01/2018    ASSESSMENT / PLAN: Problem List Items Addressed This Visit    Coronary artery disease involving native coronary artery of native heart with angina pectoris ([place]) - Primary (Chronic)    He has had that one spell of chest discomfort back in December evaluated with a cardiac catheterization showing patent stents.  No further symptoms since then.  Remains on aspirin and Brilinta.  In November I suspect we can reduce to 60 mg twice daily, but would continue Brilinta alone and stop aspirin. After November would be okay to hold Brilinta for procedures.  Clearly not able to be on beta-blocker.  Is on ARB.  Intolerant to statins.  Will restart Zetia.      Relevant Orders   Lipid panel   Comprehensive metabolic panel   Essential hypertension (Chronic)    Blood pressure is high today.  He is sort of recovering from a migraine and that may be part of the issue, but I do think that we probably will need to be more aggressive. I will have him follow-up with CV [facility] to discuss cholesterol in 2 to 3 weeks.  Would also like to see his blood pressure check at that time as well.     If elevated would increase losartan to 50 mg.      History of abdominal aortic aneurysm (AAA) repair   Hyperlipidemia with target LDL less than 70 (Chronic)    Never started Livalo.  Just has resigned  himself to not be on take his medications.  I do think that his best option with his lipids from last year would be PCSK9 inhibitor versus the newer twice a year injection medications currently in trials.  Plan: Recheck lipid  panel and chemistry panel and have him follow-up with [facility] pharmacists lipid clinic to discuss options.  (Would also relook blood pressures)      Relevant Orders   Lipid panel   Comprehensive metabolic panel   Ischemic cardiomyopathy (Chronic)    It would appear by his most recent cath that the LV gram suggested his EF is pretty much back to normal. It would not be unreasonable to recheck an echocardiogram following his next visit just to get a new baseline EF.  Certainly unable to use beta-blockers because of his bradycardia and now pauses. He is on losartan which I suspect that we will probably need to titrate higher given his current blood pressure.  No signs of volume overload.  No signs of heart failure with PND orthopnea.  Has not required diuretic.      Relevant Orders   Comprehensive metabolic panel   Thoracic aortic aneurysm ([place]) - descending (residual after AAA repair) (Chronic)    Being followed by his vascular surgeon @ [facility]      Tobacco abuse (Chronic)    He has made concerted efforts to cut down to less than a pack a day.  Prior reports and indicated that he is down to maybe 5 or 6 cigarettes a day.  I think is probably more than that.  He does not seem to be all that excited about stopping.  I talked about using patches or Wellbutrin or other options, but he did not seem to be interested in such things at this point.  We will need to continue to press the issue.  ~3-5 minutes spent in counseling.         I spent a total of 35 minutes with the patient and chart review. >  50% of the time was spent in direct patient consultation.  Extensive chart review as this is my first time seeing him 10 months after his STEMI with multiple different visits, procedures etc.  We then spent a long time talking about his lipids and discussing the results of his monitor (the monitor was not available for me to read until clinic visit)  Current medicines are reviewed at length with the  patient today.  (+/- concerns) n/a The following changes have been made:  need to start Zetia & consider PCSK-9 I  Patient Instructions  Medication Instructions:  - restart taking zetia 10 mg daily    - okay to hold -BRILINTA FOR AAA PROCEDURES ON 02/04/2019  HOLD FOR 5 DAYS THEN DECREASE TO 60 MG  TWICE A DAY -  (NEW PRESCRIPTION SENT TO PHARMACY   If you need a refill on your cardiac medications before your next appointment, please call your pharmacy.   Lab work:  If you have labs (blood work) drawn today and your tests are completely normal, you will receive your results only by: Marland Kitchen MyChart Message (if you have MyChart) OR . A paper copy in the mail If you have any lab test that is abnormal or we need to change your treatment, we will call you to review the results.  Testing/Procedures:   Follow-Up: At [facility], you and your health needs are our priority.  As part of our continuing mission to provide you with exceptional heart  care, we have created designated Provider Care Teams.  These Care Teams include your primary Cardiologist (physician) and Advanced Practice Providers (APPs -  Physician Assistants and Nurse Practitioners) who all work together to provide you with the care you need, when you need it. You will need a follow up appointment in  5  Months-FEB 2021.  Please call our office 2 months in advance to schedule this appointment.  You may see Glenetta Hew, MD   Your physician recommends that you schedule a follow-up appointment in:2-3 WEEKS [facility] - need to discuss cholesterol  Options & Blood Pressure  Any Other Special Instructions Will Be Listed Below (If Applicable).    Studies Ordered:   Orders Placed This Encounter  Procedures  . Lipid panel  . Comprehensive metabolic panel      Glenetta Hew, M.D., M.S. Interventional Cardiologist   Pager # [phone] Phone # [phone] [address] [place], [place] [address]   Thank you for  choosing Heartcare at [facility]!!

## 2018-12-16 ENCOUNTER — Encounter: Payer: Self-pay | Admitting: Cardiology

## 2018-12-16 NOTE — Assessment & Plan Note (Signed)
Never started Livalo.  Just has resigned himself to not be on take his medications.  I do think that his best option with his lipids from last year would be PCSK9 inhibitor versus the newer twice a year injection medications currently in trials.  Plan: Recheck lipid panel and chemistry panel and have him follow-up with [facility] pharmacists lipid clinic to discuss options.  (Would also relook blood pressures)

## 2018-12-16 NOTE — Assessment & Plan Note (Signed)
He has made concerted efforts to cut down to less than a pack a day.  Prior reports and indicated that he is down to maybe 5 or 6 cigarettes a day.  I think is probably more than that.  He does not seem to be all that excited about stopping.  I talked about using patches or Wellbutrin or other options, but he did not seem to be interested in such things at this point.  We will need to continue to press the issue.  ~3-5 minutes spent in counseling.

## 2018-12-16 NOTE — Assessment & Plan Note (Signed)
He has had that one spell of chest discomfort back in December evaluated with a cardiac catheterization showing patent stents.  No further symptoms since then.  Remains on aspirin and Brilinta.  In November I suspect we can reduce to 60 mg twice daily, but would continue Brilinta alone and stop aspirin. After November would be okay to hold Brilinta for procedures.  Clearly not able to be on beta-blocker.  Is on ARB.  Intolerant to statins.  Will restart Zetia.

## 2018-12-16 NOTE — Assessment & Plan Note (Addendum)
It would appear by his most recent cath that the LV gram suggested his EF is pretty much back to normal. It would not be unreasonable to recheck an echocardiogram following his next visit just to get a new baseline EF.  Certainly unable to use beta-blockers because of his bradycardia and now pauses. He is on losartan which I suspect that we will probably need to titrate higher given his current blood pressure.  No signs of volume overload.  No signs of heart failure with PND orthopnea.  Has not required diuretic.

## 2018-12-16 NOTE — Assessment & Plan Note (Signed)
Being followed by his vascular surgeon @ [facility]

## 2018-12-16 NOTE — Assessment & Plan Note (Signed)
Blood pressure is high today.  He is sort of recovering from a migraine and that may be part of the issue, but I do think that we probably will need to be more aggressive. I will have him follow-up with CV [facility] to discuss cholesterol in 2 to 3 weeks.  Would also like to see his blood pressure check at that time as well.     If elevated would increase losartan to 50 mg.

## 2018-12-21 ENCOUNTER — Other Ambulatory Visit: Payer: Self-pay | Admitting: Cardiology

## 2018-12-21 ENCOUNTER — Other Ambulatory Visit: Payer: Self-pay

## 2018-12-21 DIAGNOSIS — R002 Palpitations: Secondary | ICD-10-CM

## 2018-12-23 LAB — COMPREHENSIVE METABOLIC PANEL
ALT: 10 IU/L (ref 0–44)
AST: 12 IU/L (ref 0–40)
Albumin/Globulin Ratio: 1.8 (ref 1.2–2.2)
Albumin: 4.1 g/dL (ref 3.7–4.7)
Alkaline Phosphatase: 156 IU/L — ABNORMAL HIGH (ref 39–117)
BUN/Creatinine Ratio: 9 — ABNORMAL LOW (ref 10–24)
BUN: 11 mg/dL (ref 8–27)
Bilirubin Total: 0.8 mg/dL (ref 0.0–1.2)
CO2: 24 mmol/L (ref 20–29)
Calcium: 9.7 mg/dL (ref 8.6–10.2)
Chloride: 101 mmol/L (ref 96–106)
Creatinine, Ser: 1.2 mg/dL (ref 0.76–1.27)
GFR calc Af Amer: 70 mL/min/{1.73_m2} (ref >59)
GFR calc non Af Amer: 60 mL/min/{1.73_m2} (ref >59)
Globulin, Total: 2.3 g/dL (ref 1.5–4.5)
Glucose: 83 mg/dL (ref 65–99)
Potassium: 4.8 mmol/L (ref 3.5–5.2)
Sodium: 137 mmol/L (ref 134–144)
Total Protein: 6.4 g/dL (ref 6.0–8.5)

## 2018-12-23 LAB — LIPID PANEL
Chol/HDL Ratio: 4.9 ratio (ref 0.0–5.0)
Cholesterol, Total: 224 mg/dL — ABNORMAL HIGH (ref 100–199)
HDL: 46 mg/dL (ref >39)
LDL Chol Calc (NIH): 146 mg/dL — ABNORMAL HIGH (ref 0–99)
Triglycerides: 175 mg/dL — ABNORMAL HIGH (ref 0–149)
VLDL Cholesterol Cal: 32 mg/dL (ref 5–40)

## 2018-12-27 ENCOUNTER — Other Ambulatory Visit: Payer: Self-pay

## 2018-12-27 ENCOUNTER — Ambulatory Visit (INDEPENDENT_AMBULATORY_CARE_PROVIDER_SITE_OTHER): Payer: Medicare HMO | Admitting: Pharmacist Clinician (PhC)/ Clinical Pharmacy Specialist

## 2018-12-27 DIAGNOSIS — E785 Hyperlipidemia, unspecified: Secondary | ICD-10-CM | POA: Diagnosis not present

## 2018-12-27 NOTE — Patient Instructions (Signed)
We will keep you in mind for when the new medication is FDA approved.  We hope that will be in December.  The medication is called Inclisiran and once it is approved we can determine whether it will be an in office injection or self-injectable.    Good luck with your procedure at [facility] and we will contact you in December.

## 2018-12-27 NOTE — Progress Notes (Signed)
12/27/2018 Patrick Adams [birth year] [id number]   HPI:  Patrick Adams is a 73 y.o. male patient of Dr Ellyn Hack, who presents today for a lipid clinic evaluation. In addition to HLD, patient has history significant of CAD, HTN, STEMI, CHF  RA presents today in good spirits. He inquired about 2x/year PCSK9i currently in trial, inclisiran. He has an AAA procedure coming up in November of 2020 and seemed nervous about that. He was inquisitive about various ADE of Repatha and exhibited reluctance to using needles. He requested to wait until after his surgery to make any decisions. He was content with only listening to the injectable options. His biggest concern was the ADE he experienced with statins and ezetimibe.   Current Medications: ASA 81mg  (stopping in November 2020) Losartan 25 Brilinta 90mg  BID (60mg  BID after 02/04/19 AAA) Nitroglycerin PRN  He stopped Statins and Zetia d/t ADE  Risk Factors: CAD, HTN, STEMI, CHF, Male, Age, smokes 5-6 cigarettes/day  Cholesterol Goals: LDL reduction of 50%   Intolerant/previously tried: Crestor & Livalo  Family history:  Dad: heart attack, HLD Mom: HLD, stroke Sister: all fine Daughter: no issues   Diet:   Protein: chicken, beef,  Vegetables: from a restaurant Eats out for every meal---mostly sit down restaurants Eats [facility] frequently  Exercise:   Normally yard work  May weight ~171  September weight 164.8 Labs:  12/22/2018  Ref Range & Units 12/22/18  Cholesterol, Total 100 - 199 mg/dL 224High    Triglycerides 0 - 149 mg/dL 175High    HDL >39 mg/dL 46   VLDL Cholesterol Cal 5 - 40 mg/dL 32   LDL Chol Calc (NIH) 0 - 99 mg/dL 146High    Chol/HDL Ratio 0.0 - 5.0 ratio 4.9    02/01/2018 Cholesterol 0 - 200 mg/dL 194   Triglycerides <150 mg/dL 156High    HDL >40 mg/dL 34Low    Total CHOL/HDL Ratio RATIO 5.7   VLDL 0 - 40 mg/dL 31   LDL Cholesterol 0 - 99 mg/dL 129High        Current Outpatient Medications  Medication Sig  Dispense Refill  . acetaminophen (TYLENOL) 500 MG tablet Take 1,000 mg by mouth 2 (two) times daily as needed for headache.     Marland Kitchen aspirin EC 81 MG tablet Take 81 mg by mouth at bedtime.    . cholecalciferol (VITAMIN D3) 25 MCG (1000 UT) tablet Take 1,000 Units by mouth daily.    Marland Kitchen dicyclomine (BENTYL) 20 MG tablet Take 0.5 tablets (10 mg total) by mouth 2 (two) times daily for 5 days. 10 tablet 0  . ezetimibe (ZETIA) 10 MG tablet Take 1 tablet (10 mg total) by mouth daily. 30 tablet 11  . losartan (COZAAR) 25 MG tablet TAKE 1 TABLET BY MOUTH EVERY DAY 90 tablet 2  . nitroGLYCERIN (NITROSTAT) 0.4 MG SL tablet Place 1 tablet (0.4 mg total) under the tongue every 5 (five) minutes as needed for chest pain. 100 tablet 3  . Pitavastatin Calcium (LIVALO) 4 MG TABS Take 1 tablet (4 mg total) by mouth daily. 30 tablet   . ticagrelor (BRILINTA) 60 MG TABS tablet Take 1 tablet (60 mg total) by mouth 2 (two) times daily. 60 tablet 11   No current facility-administered medications for this visit.    Facility-Administered Medications Ordered in Other Visits  Medication Dose Route Frequency Provider Last Rate Last Dose  . 0.9 %  sodium chloride infusion    Continuous PRN Leonie Man, MD 10 mL/hr  at 01/31/18 1912 10 mL/hr at 01/31/18 1912  . 0.9 %  sodium chloride infusion    Continuous PRN Leonie Man, MD 100 mL/hr at 01/31/18 1912    . fentaNYL (SUBLIMAZE) injection    PRN Leonie Man, MD   25 mcg at 01/31/18 1933  . iohexol (OMNIPAQUE) 350 MG/ML injection    PRN Leonie Man, MD   165 mL at 01/31/18 2019  . lidocaine (PF) (XYLOCAINE) 1 % injection    PRN Leonie Man, MD   2 mL at 01/31/18 1914  . midazolam (VERSED) injection    PRN Leonie Man, MD   1 mg at 01/31/18 1933  . morphine 2 MG/ML injection    PRN Leonie Man, MD   2 mg at 01/31/18 2030  . morphine 2 MG/ML injection    PRN Leonie Man, MD   2 mg at 01/31/18 2017    Allergies  Allergen Reactions  .  Statins     Joint pain.    Past Medical History:  Diagnosis Date  . CAD S/P PCI 01/2018   a) 01/31/2018: p-mLAD 60-100% (DES PCI with 2.75 mm x 38 mm Synergy DES - 4.0-3.6.3.0 mm tapered post-dilation); b) p-mRCA 55%-85%-40% - Synergy DES 3.5 mm x 32 mm -> 4.0 mm; & RPAV 90% --> Synergy DES 2.5 mm x 12 mm -> 3.0 mm;; c) stents patent on re-look Cath 03/19/2018  . Hemochromatosis   . History of abdominal aortic aneurysm (AAA) repair 2008   With thoracic aortic aneurysm being monitored  . Hypertension   . Ischemic cardiomyopathy 01/31/2018   Initial post STEMI EF by echo 40 to 45% --> by cath 1 month later LV gram showed EF 55 to 60%.  . ST elevation (STEMI) myocardial infarction involving left anterior descending coronary artery ([place]) 01/31/2018   Found to have pLAD 60%-mLAD 100% (DES PCI), distal LAD diffuse 60-70% (med Rx), OM1 65%; also signifiicant p-m RCA & RPAV disease  --> Initial EF ~45% with elevated LVEDP  . 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  Assessment: We contacted the study drug center for inclisiran and he was unable to be included in the 2x/ year injection study, ORION-4, d/t his upcoming AAA procedure in November. The patient eats all of his meals at restaurants, including [employer], and does not exercise d/t a left knee issue that keeps him from walking lengthy distances according to the patient. He still smokes 6-7 cigarettes per day. The patient had no interest in starting Repatha today, in office, but did verbalize that he needed to get the number down. I did sense a concern for self injections. We discussed both the 2x/month Repatha and the once monthly Repatha. His LDL on 01/2018 was 129 and increased to 146 on 11/2018.  Plan: 1) Reassess in late November/December 2) If study drug is closing in on FDA approval, we will trend in that direction. ---if approval is not in the foreseeable future  then we will consider Repatha 3) Address Diet/Exercise at next visit 4) Address smoking cessation  Ned Clines, PharmD Candidate [phone] [facility]   I was present for entire visit and agree with above assessment and plan.  Tommy Medal PharmD CPP [facility]

## 2019-01-06 ENCOUNTER — Other Ambulatory Visit: Payer: Self-pay | Admitting: Cardiology

## 2019-01-17 HISTORY — PX: TRANSTHORACIC ECHOCARDIOGRAM: SHX275

## 2019-02-23 HISTORY — PX: THORACOABDOMINAL AORTIC ANEURYSM REPAIR: SHX2504

## 2019-03-01 ENCOUNTER — Telehealth: Payer: Self-pay | Admitting: Cardiology

## 2019-03-01 NOTE — Telephone Encounter (Signed)
Will await response from MD>

## 2019-03-01 NOTE — Telephone Encounter (Signed)
Patient states that he has been on the Brilinta- but when he was taken off of it for surgery he noticed that his breathing got much better. When he started back on the medication the breathing has worsened again.   He believes it is coming from the medication. He denies any other symptoms- no chest pains, and he does have swelling but it is from his surgery. Nothing new for him at this time.   I advised I would reach out to pharmd to advise if this is a side effect and give recommendations as well as making Dr.Harding aware.  Patient verbalized understanding

## 2019-03-01 NOTE — Telephone Encounter (Signed)
This is a side effect of Brilinta. He can try taking his medication with a cup of coffee (or something else caffienated) as this is know to help improve this side effect.   Other option would be to switch to plavix, but this would be up to Dr. Ellyn Hack.

## 2019-03-01 NOTE — Telephone Encounter (Signed)
Pt c/o medication issue:  1. Name of Medication: Brilinta 2. How are you currently taking this medication (dosage and times per day)? 2 times a day  3. Are you having a reaction (difficulty breathing--STAT)? Yes difficulty in breathing  4. What is your medication issue? Having a lot of shortness of breath

## 2019-03-02 ENCOUNTER — Encounter: Payer: Self-pay | Admitting: *Deleted

## 2019-03-02 MED ORDER — CLOPIDOGREL BISULFATE 75 MG PO TABS: 75.0000 mg | ORAL_TABLET | Freq: Every day | ORAL | 3 refills | Status: DC

## 2019-03-02 NOTE — Telephone Encounter (Signed)
Spoke to patient - instruction giving to stop Brilinta   and start Clopidogrel 75 mg ( plavix)  For the first dose take 4 tablets , then one tablet daily thereafter.  Patient states he stopped taking Brilinta about 2 days ago. Patient aware to pick medication to day and start taking Clopidogrel . E-sent medication to pharmacy.  Voiced understanding.

## 2019-03-02 NOTE — Telephone Encounter (Signed)
I am fine with switching him to Plavix 75 mg daily.  First day take 4 tablets then once daily.  Rx: Clopidogrel 75 mg p.o. daily.  Dispense #90 tab, 3 refill  Glenetta Hew, MD

## 2019-03-03 ENCOUNTER — Telehealth: Payer: Self-pay | Admitting: Cardiology

## 2019-03-03 NOTE — Telephone Encounter (Signed)
New Message:    Pt said when he went to pick up his Brilinta yesterday, the pharmacist told him to talk to his doctor. He told pt to see if his doctor wanted him to take Brilinta and his Aspirin?

## 2019-03-04 NOTE — Telephone Encounter (Signed)
RN spoke to Dr Ellyn Hack,  Per Dr Ellyn Hack, patient may stop taking Aspirin 81 mg , continue Plavix 75.  Patient was recently switch to  Plavix from Brilinta due   Breathing issues.   patient is aware , verbalized understanding.

## 2019-04-25 ENCOUNTER — Encounter: Payer: Self-pay | Admitting: Gastroenterology

## 2019-05-02 DIAGNOSIS — K56609 Unspecified intestinal obstruction, unspecified as to partial versus complete obstruction: Secondary | ICD-10-CM

## 2019-05-02 DIAGNOSIS — C3491 Malignant neoplasm of unspecified part of right bronchus or lung: Secondary | ICD-10-CM

## 2019-05-02 HISTORY — DX: Unspecified intestinal obstruction, unspecified as to partial versus complete obstruction: K56.609

## 2019-05-02 HISTORY — DX: Malignant neoplasm of unspecified part of right bronchus or lung: C34.91

## 2019-05-07 ENCOUNTER — Emergency Department (HOSPITAL_COMMUNITY): Payer: Medicare HMO

## 2019-05-07 ENCOUNTER — Encounter (HOSPITAL_COMMUNITY): Payer: Self-pay | Admitting: Radiology

## 2019-05-07 ENCOUNTER — Emergency Department (HOSPITAL_COMMUNITY)
Admission: EM | Admit: 2019-05-07 | Discharge: 2019-05-08 | Disposition: A | Discharge status: Other Acute Inpatient Hospital | Payer: Medicare HMO | Attending: Emergency Medicine | Admitting: Emergency Medicine

## 2019-05-07 DIAGNOSIS — I712 Thoracic aortic aneurysm, without rupture: Secondary | ICD-10-CM | POA: Insufficient documentation

## 2019-05-07 DIAGNOSIS — Z20822 Contact with and (suspected) exposure to covid-19: Secondary | ICD-10-CM | POA: Insufficient documentation

## 2019-05-07 DIAGNOSIS — Z79899 Other long term (current) drug therapy: Secondary | ICD-10-CM | POA: Insufficient documentation

## 2019-05-07 DIAGNOSIS — F17210 Nicotine dependence, cigarettes, uncomplicated: Secondary | ICD-10-CM | POA: Insufficient documentation

## 2019-05-07 DIAGNOSIS — I2510 Atherosclerotic heart disease of native coronary artery without angina pectoris: Secondary | ICD-10-CM | POA: Insufficient documentation

## 2019-05-07 DIAGNOSIS — I716 Thoracoabdominal aortic aneurysm, without rupture, unspecified: Secondary | ICD-10-CM

## 2019-05-07 DIAGNOSIS — I119 Hypertensive heart disease without heart failure: Secondary | ICD-10-CM | POA: Diagnosis not present

## 2019-05-07 DIAGNOSIS — K5900 Constipation, unspecified: Secondary | ICD-10-CM | POA: Insufficient documentation

## 2019-05-07 DIAGNOSIS — R1013 Epigastric pain: Secondary | ICD-10-CM | POA: Diagnosis present

## 2019-05-07 DIAGNOSIS — R112 Nausea with vomiting, unspecified: Secondary | ICD-10-CM | POA: Insufficient documentation

## 2019-05-07 DIAGNOSIS — Z9889 Other specified postprocedural states: Secondary | ICD-10-CM | POA: Insufficient documentation

## 2019-05-07 LAB — BASIC METABOLIC PANEL
Anion gap: 15 (ref 5–15)
BUN: 19 mg/dL (ref 8–23)
CO2: 20 mmol/L — ABNORMAL LOW (ref 22–32)
Calcium: 9.8 mg/dL (ref 8.9–10.3)
Chloride: 101 mmol/L (ref 98–111)
Creatinine, Ser: 1.3 mg/dL — ABNORMAL HIGH (ref 0.61–1.24)
GFR calc Af Amer: >60 mL/min (ref >60)
GFR calc non Af Amer: 55 mL/min — ABNORMAL LOW (ref >60)
Glucose, Bld: 134 mg/dL — ABNORMAL HIGH (ref 70–99)
Potassium: 4 mmol/L (ref 3.5–5.1)
Sodium: 136 mmol/L (ref 135–145)

## 2019-05-07 LAB — CBC WITH DIFFERENTIAL/PLATELET
Abs Immature Granulocytes: 0.03 10*3/uL (ref 0.00–0.07)
Basophils Absolute: 0 10*3/uL (ref 0.0–0.1)
Basophils Relative: 0 %
Eosinophils Absolute: 0 10*3/uL (ref 0.0–0.5)
Eosinophils Relative: 0 %
HCT: 34.7 % — ABNORMAL LOW (ref 39.0–52.0)
Hemoglobin: 11.2 g/dL — ABNORMAL LOW (ref 13.0–17.0)
Immature Granulocytes: 0 %
Lymphocytes Relative: 5 %
Lymphs Abs: 0.4 10*3/uL — ABNORMAL LOW (ref 0.7–4.0)
MCH: 29.4 pg (ref 26.0–34.0)
MCHC: 32.3 g/dL (ref 30.0–36.0)
MCV: 91.1 fL (ref 80.0–100.0)
Monocytes Absolute: 0.3 10*3/uL (ref 0.1–1.0)
Monocytes Relative: 3 %
Neutro Abs: 8.1 10*3/uL — ABNORMAL HIGH (ref 1.7–7.7)
Neutrophils Relative %: 92 %
Platelets: 272 10*3/uL (ref 150–400)
RBC: 3.81 MIL/uL — ABNORMAL LOW (ref 4.22–5.81)
RDW: 16.3 % — ABNORMAL HIGH (ref 11.5–15.5)
WBC: 8.9 10*3/uL (ref 4.0–10.5)
nRBC: 0 % (ref 0.0–0.2)

## 2019-05-07 LAB — TROPONIN I (HIGH SENSITIVITY)
Troponin I (High Sensitivity): 4 ng/L (ref <18)
Troponin I (High Sensitivity): 5 ng/L (ref <18)

## 2019-05-07 LAB — RESPIRATORY PANEL BY RT PCR (FLU A&B, COVID)
Influenza A by PCR: NEGATIVE
Influenza B by PCR: NEGATIVE
SARS Coronavirus 2 by RT PCR: NEGATIVE

## 2019-05-07 LAB — PROTIME-INR
INR: 1.1 (ref 0.8–1.2)
Prothrombin Time: 14 seconds (ref 11.4–15.2)

## 2019-05-07 LAB — TYPE AND SCREEN
ABO/RH(D): O POS
Antibody Screen: NEGATIVE

## 2019-05-07 LAB — APTT: aPTT: 33 seconds (ref 24–36)

## 2019-05-07 LAB — ABO/RH: ABO/RH(D): O POS

## 2019-05-07 MED ORDER — MORPHINE SULFATE (PF) 4 MG/ML IV SOLN
4.0000 mg | Freq: Once | INTRAVENOUS | Status: AC
  Administered 2019-05-07: 4 mg via INTRAVENOUS
  Filled 2019-05-07: qty 1

## 2019-05-07 MED ORDER — SODIUM CHLORIDE 0.9 % IV SOLN: INTRAVENOUS | Status: DC | PRN

## 2019-05-07 MED ORDER — NITROPRUSSIDE SODIUM-NACL 10-0.9 MG/50ML-% IV SOLN
0.0000 ug/kg/min | INTRAVENOUS | Status: DC
  Filled 2019-05-07: qty 50

## 2019-05-07 MED ORDER — NITROPRUSSIDE SODIUM-NACL 20-0.9 MG/100ML-% IV SOLN
0.0000 ug/kg/min | INTRAVENOUS | Status: DC
  Administered 2019-05-07: 0.3 ug/kg/min via INTRAVENOUS
  Filled 2019-05-07: qty 100

## 2019-05-07 MED ORDER — LABETALOL HCL 5 MG/ML IV SOLN
20.0000 mg | Freq: Once | INTRAVENOUS | Status: AC
  Administered 2019-05-07: 20 mg via INTRAVENOUS
  Filled 2019-05-07: qty 4

## 2019-05-07 MED ORDER — ESMOLOL HCL-SODIUM CHLORIDE 2000 MG/100ML IV SOLN
25.0000 ug/kg/min | INTRAVENOUS | Status: DC
  Administered 2019-05-07: 25 ug/kg/min via INTRAVENOUS
  Filled 2019-05-07 (×2): qty 100

## 2019-05-07 MED ORDER — IOHEXOL 350 MG/ML SOLN
100.0000 mL | Freq: Once | INTRAVENOUS | Status: AC | PRN
  Administered 2019-05-07: 16:00:00 100 mL via INTRAVENOUS

## 2019-05-07 MED ORDER — HYDROMORPHONE HCL 1 MG/ML IJ SOLN
1.0000 mg | Freq: Once | INTRAMUSCULAR | Status: AC
  Administered 2019-05-07: 1 mg via INTRAVENOUS
  Filled 2019-05-07: qty 1

## 2019-05-07 NOTE — ED Notes (Signed)
Called [facility] for transport will call with an ETA

## 2019-05-07 NOTE — ED Notes (Signed)
This RN was informed by [facility] that transport would arrive approx. At 0040-0050.

## 2019-05-07 NOTE — ED Notes (Signed)
Dr. Kathrynn Humble made aware of increased BP; no further orders at this time.

## 2019-05-07 NOTE — ED Provider Notes (Signed)
11:56 PM Assumed care from Dr. Kathrynn Humble, please see their note for full history, physical and decision making until this point. In brief this is a 74 y.o. year old male who presented to the ED tonight with Abdominal Pain and Constipation     Known aortic issues, now with new saccular aneurysm around area of previous stent. Pending transfer to [facility], Dr Scherrie Gerlach accepting. Nipride and esmolol for goal SBP < 140  Recheck prior to transfer and patient BP consistently in 110-120 SBP range. No new complaints. Overall appears well and stable for transfer for continuity of care.   Labs, studies and imaging reviewed by myself and considered in medical decision making if ordered. Imaging interpreted by radiology.  Labs Reviewed  CBC WITH DIFFERENTIAL/PLATELET - Abnormal; Notable for the following components:      Result Value   RBC 3.81 (*)    Hemoglobin 11.2 (*)    HCT 34.7 (*)    RDW 16.3 (*)    Neutro Abs 8.1 (*)    Lymphs Abs 0.4 (*)    All other components within normal limits  BASIC METABOLIC PANEL - Abnormal; Notable for the following components:   CO2 20 (*)    Glucose, Bld 134 (*)    Creatinine, Ser 1.30 (*)    GFR calc non Af Amer 55 (*)    All other components within normal limits  RESPIRATORY PANEL BY RT PCR (FLU A&B, COVID)  APTT  PROTIME-INR  TYPE AND SCREEN  ABO/RH  TROPONIN I (HIGH SENSITIVITY)  TROPONIN I (HIGH SENSITIVITY)    CT Angio Chest/Abd/Pel for Dissection W and/or Wo Contrast  Final Result  Addendum 1 of 1  ADDENDUM REPORT: 05/07/2019 18:38    ADDENDUM:  [facility] request has been made for outside prior CT performed at  [facility] dated 01/2019 for more recent comparison imaging. Case will be  addended for comparison to this imaging when available. Examination  was discussed by telephone with Dr. Thelma Comp at [facility] and Dr.  Leonette Monarch with [facility] vascular surgery.      Electronically Signed    By: Eddie Candle M.D.    On: 05/07/2019 18:38      Final       No follow-ups on file.    Kylen Ismael, Corene Cornea, MD 05/08/19 [phone]

## 2019-05-07 NOTE — Procedures (Signed)
Arterial Catheter Insertion Procedure Note Patrick Adams [id number] February 10, 1946  Procedure: Insertion of Arterial Catheter  Indications: Blood pressure monitoring  Procedure Details Consent: Risks of procedure as well as the alternatives and risks of each were explained to the (patient/caregiver).  Consent for procedure obtained. Time Out: Verified patient identification, verified procedure, site/side was marked, verified correct patient position, special equipment/implants available, medications/allergies/relevent history reviewed, required imaging and test results available.  Performed  Maximum sterile technique was used including antiseptics, cap, gloves, gown, hand hygiene, mask and sheet. Skin prep: Chlorhexidine; local anesthetic administered 20 gauge catheter was inserted into right radial artery using the Seldinger technique. ULTRASOUND GUIDANCE USED: NO Evaluation Blood flow good; BP tracing good. Complications: No apparent complications.   Patrick Adams 05/07/2019

## 2019-05-07 NOTE — ED Notes (Signed)
Patient transported to CT 

## 2019-05-07 NOTE — ED Notes (Signed)
[facility] Resident [phone]) paged to [phone] Dr. Kathrynn Humble paged by Levada Dy

## 2019-05-07 NOTE — ED Provider Notes (Signed)
[place] EMERGENCY DEPARTMENT Provider Note   CSN: [id number] Arrival date & time: 05/07/19  1309     History Chief Complaint  Patient presents with  . Abdominal Pain  . Constipation    This is a 74 year old gentleman with a fairly complicated cardiovascular and aortic history as noted below, presenting to the Ed with abdominal pain.  Patient is undergone endovascular repair at [facility] in nov 2020.  Additional surgical history is elicited from his discharge note at their hospital, which states  Mr. Payton Prinsen is a 74 y.o. 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 He underwent lumbar drain insertion on 02/22/19 and was admitted in preparation for planned aneurysm repair. On 02/23/19 he underwent a four vessel FEVAR with Cook Branched Device using bilateral groin percutaneous access and left brachial artery cutdown.    Patient tells me that even prior to his surgery difficulties with constipation abdominal pain.  He said after his aneurysm repair, he is only been able to stool with the help of laxatives and stool softeners.  Is been having very small bowel movements.  He says for the past several days been able to have any substantial bowel movement, with a tiny one earlier today.  He is not passing gas.  He is nauseated and has vomited spontaneously few times.  He has no prior history of bowel obstruction that he is aware of.  He came to the emergency department today because he began having acute onset mid epigastric, stabbing pain yesterday evening.  He was told by his surgeon that if this happens he should come to the ER immediately because it could be an issue with his aortic repair.    Of note, the patient tells me that he did have phlebotomy done yesterday for his lifelong hemochromatosis.  He said they took "a  pint" of blood out of him and replace this with normal saline.  He said that after this procedure, he began having epigastric pain  He denies CP, SOB, lightheadedness, or syncope.  He says this does not feel like his prior MI in 2019.  He continues to smoke cigarettes but has "cut down a lot."   HPI     Past Medical History:  Diagnosis Date  . CAD S/P PCI 01/2018   a) 01/31/2018: p-mLAD 60-100% (DES PCI with 2.75 mm x 38 mm Synergy DES - 4.0-3.6.3.0 mm tapered post-dilation); b) p-mRCA 55%-85%-40% - Synergy DES 3.5 mm x 32 mm -> 4.0 mm; & RPAV 90% --> Synergy DES 2.5 mm x 12 mm -> 3.0 mm;; c) stents patent on re-look Cath 03/19/2018  . Hemochromatosis   . History of abdominal aortic aneurysm (AAA) repair 2008   With thoracic aortic aneurysm being monitored  . Hypertension   . Ischemic cardiomyopathy 01/31/2018   Initial post STEMI EF by echo 40 to 45% --> by cath 1 month later LV gram showed EF 55 to 60%.  . ST elevation (STEMI) myocardial infarction involving left anterior descending coronary artery ([place]) 01/31/2018   Found to have pLAD 60%-mLAD 100% (DES PCI), distal LAD diffuse 60-70% (med Rx), OM1 65%; also signifiicant p-m RCA & RPAV disease  --> Initial EF ~45% with elevated LVEDP  . Thoracic aortic aneurysm [facility]) - descending (residual after AAA repair) 2008    Patient Active Problem List  Diagnosis Date Noted  . Tobacco abuse   . Coronary artery disease involving native coronary artery of native heart with angina pectoris ([place]) 01/31/2018  . Essential hypertension 01/31/2018  . Hyperlipidemia with target LDL less than 70 01/31/2018  . Acute ST elevation myocardial infarction (STEMI) involving left anterior descending (LAD) coronary artery ([place]) 01/31/2018  . Acute combined systolic and diastolic heart failure ([place]) 01/31/2018  . Ischemic cardiomyopathy 01/31/2018  . History of abdominal aortic aneurysm (AAA) repair 2008  . Thoracic aortic aneurysm [facility]) - descending  (residual after AAA repair) 2008    Past Surgical History:  Procedure Laterality Date  . ABDOMINAL AORTIC ANEURYSM REPAIR  2008  . CORONARY STENT INTERVENTION N/A 02/03/2018   Procedure: CORONARY STENT INTERVENTION;  Surgeon: Belva Crome, MD;  Location: [facility] INVASIVE CV LAB;;   staged PCI p-mRCA (Synergy DES 3.5 mm x 32 mm--4.0 mm covering entire diseased segment from  proximal 55 through the 85 and 40% lesions in the mid vessel), RPAV (Synergy DES 2.5 mm x 12 mm--3.0 mm)  . CORONARY/GRAFT ACUTE MI REVASCULARIZATION N/A 01/31/2018   Procedure: Coronary/Graft Acute MI Revascularization;  Surgeon: Leonie Man, MD;  Location: [place] CV LAB;; ANTERIOR-INFERIOR STEMI: pLAD 60% - mLAD 100% (PCI-Synergy DES 2.75 mm at 38 mm--4.1-3.6-3.0 mm tapered post dilation)  . LEFT HEART CATH AND CORONARY ANGIOGRAPHY N/A 01/31/2018   Procedure: LEFT HEART CATH AND CORONARY ANGIOGRAPHY;  Surgeon: Leonie Man, MD;  Location: [facility] INVASIVE CV LAB;;; (anterior inferior STEMI) -> pLAD 60% - mLAD 100% (DES PCI); p-m RCA 55%, mRCA 85%, mRCA 40% (diffuse segmental disease), RPAV 90% (ulcerated), OM1 65%.  EF 35-45%, moderately elevated LVEDP --acute combined systolic and diastolic heart failure, ischemic cardiomyopathy  . LEFT HEART CATH AND CORONARY ANGIOGRAPHY N/A 02/03/2018   Procedure: LEFT HEART CATH AND CORONARY ANGIOGRAPHY;  Surgeon: Belva Crome, MD;  Location: [place] CV LAB;  Service: Cardiovascular;  Laterality: N/A;  . LEFT HEART CATH AND CORONARY ANGIOGRAPHY N/A 03/19/2018   Procedure: LEFT HEART CATH AND CORONARY ANGIOGRAPHY;  Surgeon: Martinique, Peter M, MD;  Location: [place] CV LAB;  Service: Cardiovascular;  Patent stent in p-mLAD, patent p-m RCA stent and RPAV stent.  Distal LAD 70%, OM1 65%.  EF 55-60%.  . TRANSTHORACIC ECHOCARDIOGRAM  02/01/2018   (Insetting of anterior-inferior STEMI) --> moderate LVH.  EF 40 to 45%.  Anterior, anteroseptal and apical severe hypokinesis to akinesis.   Indeterminate LV filling pressures.  Aortic sclerosis, no stenosis.       No family history on file.  Social History   Tobacco Use  . Smoking status: Current Every Day Smoker    Packs/day: 0.50    Types: Cigarettes  . Smokeless tobacco: Never Used  Substance Use Topics  . Alcohol use: No  . Drug use: No    Home Medications Prior to Admission medications   Medication Sig Start Date End Date Taking? Authorizing Provider  acetaminophen (TYLENOL) 500 MG tablet Take 1,000 mg by mouth 2 (two) times daily as needed for headache.     [provider]  cholecalciferol (VITAMIN D3) 25 MCG (1000 UT) tablet Take 1,000 Units by mouth daily.    [provider]  clopidogrel (PLAVIX) 75 MG tablet Take 1 tablet (75 mg total) by mouth daily. 03/02/19   Leonie Man, MD  dicyclomine (BENTYL) 20 MG tablet Take 0.5 tablets (10 mg total) by mouth 2 (two) times daily for 5 days. 08/15/18 08/20/18  Carmin Muskrat,  MD  losartan (COZAAR) 25 MG tablet TAKE 1 TABLET BY MOUTH EVERY DAY 10/28/18   Martinique, Peter M, MD  nitroGLYCERIN (NITROSTAT) 0.4 MG SL tablet Place 1 tablet (0.4 mg total) under the tongue every 5 (five) minutes as needed for chest pain. 03/19/18 03/19/19  Martinique, Peter M, MD    Allergies    Brilinta [ticagrelor] and Statins  Review of Systems   Review of Systems  Constitutional: Negative for chills and fever.  Respiratory: Negative for cough and shortness of breath.   Cardiovascular: Negative for chest pain and palpitations.  Gastrointestinal: Positive for abdominal distention, abdominal pain, constipation, nausea and vomiting. Negative for diarrhea.  Genitourinary: Negative for dysuria and hematuria.  Skin: Negative for color change and rash.  Neurological: Negative for syncope and light-headedness.  Psychiatric/Behavioral: Negative for agitation and confusion.  All other systems reviewed and are negative.   Physical Exam Updated Vital Signs BP 138/89 (BP  Location: Right Arm)   Pulse 84   Temp 97.8 F (36.6 C) (Oral)   Resp 16   SpO2 99%   Physical Exam Vitals and nursing note reviewed.  Constitutional:      Appearance: He is well-developed.  HENT:     Head: Normocephalic and atraumatic.  Eyes:     Conjunctiva/sclera: Conjunctivae normal.  Cardiovascular:     Rate and Rhythm: Normal rate and regular rhythm.     Heart sounds: No murmur.  Pulmonary:     Effort: Pulmonary effort is normal. No respiratory distress.  Abdominal:     General: There is abdominal bruit. There is no distension.     Palpations: Abdomen is soft.     Tenderness: There is abdominal tenderness in the epigastric area. There is no guarding.  Musculoskeletal:     Cervical back: Neck supple.  Skin:    General: Skin is warm and dry.  Neurological:     General: No focal deficit present.     Mental Status: He is alert and oriented to person, place, and time.     ED Results / Procedures / Treatments   Labs (all labs ordered are listed, but only abnormal results are displayed) Labs Reviewed - No data to display  EKG None  Radiology No results found.  Procedures Procedures (including critical care time)  Medications Ordered in ED Medications - No data to display  ED Course  I have reviewed the triage vital signs and the nursing notes.  Pertinent labs & imaging results that were available during my care of the patient were reviewed by me and considered in my medical decision making (see chart for details).  74 yo male presenting with epigastric/abdominal pain acute onset yesterday after phlebotomy session for hemochromatosis.  Also reporting issues with constipation for several months requiring laxatives daily, worsening this past week.  Having nausea and vomiting.  Initial differential included SBO vs diverticulitis vs. Aortic injury or stent complication or thrombosis vs gastritis vs pancreatitis vs significant constipation vs other  Patient has hx  of appendectomy Benign physical exam, not in acute distress  Labs ordered, CT dissection study to re-evaluate stent graft site  Signed out to evening EDP at 3 pm to follow up on CT imaging    Final Clinical Impression(s) / ED Diagnoses Final diagnoses:  None    Rx / DC Orders ED Discharge Orders    None       Wyvonnia Dusky, MD 05/07/19 1752

## 2019-05-07 NOTE — ED Triage Notes (Signed)
Per EMS: pt here with C/O epigastric/abdominal pain since last night around 1700.  Also has complaints of nausea and constipation x3 days.  Per pt, had a thoracic stent placed and at baseline takes stool softeners and laxatives.  Pt was also told to come to ED with any abdominal pain to be evaluated for possible stent issues.    EMS vitals:  BP 176/96 HR 88 Sp02 99% on RA CBG 147

## 2019-05-07 NOTE — ED Provider Notes (Addendum)
  Physical Exam  BP (!) 152/97   Pulse 79   Temp 97.8 F (36.6 C) (Oral)   Resp 17   SpO2 98%   Physical Exam  ED Course/Procedures     .Critical Care Performed by: Varney Biles, MD Authorized by: Varney Biles, MD   Critical care provider statement:    Critical care time (minutes):  38   Critical care was necessary to treat or prevent imminent or life-threatening deterioration of the following conditions:  Circulatory failure   Critical care was time spent personally by me on the following activities:  Discussions with consultants, evaluation of patient's response to treatment, examination of patient, ordering and performing treatments and interventions, ordering and review of laboratory studies, ordering and review of radiographic studies, pulse oximetry, re-evaluation of patient's condition, obtaining history from patient or surrogate and review of old charts    MDM   Assuming care of patient from Dr. Langston Masker.   Patient in the ED for epigastric abd pain.  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 Workup thus far shows: normal labs.  Concerning findings are as following: none. Important pending results are: CT dissection.  According to Dr. Langston Masker, plan is to f/u on CT scan. D/c if results are normal. R/o sbo or aaa endoleak.  Patient had no complains, no concerns from the nursing side. Will continue to monitor.   Varney Biles, MD 05/07/19 1519  Reassessment: CT scan reveals new saccular aneurysm at the base of the stent.  I discussed the case with our CT surgeon and with the patient complaining of epigastric pain they would want [place] to call [facility].  I discussed the case with [facility] fellow Dr.Sasson.  Reviewed over the read from the patient's radiology scan today.  Initially he felt like it was highly unlikely for patient to develop such a large aneurysm just within the last 2 months.  I asked the radiologist to directly  communicate with him.  The radiologist got the same impression and informed me about it.  We were able to push the images to [facility] and I called Dr. Leonette Monarch again to see if he can review the CT scan himself.  He is now noticing the new changes and recommends transfer to [facility].  He wants his systolic to be at 585.  Patient to be started on esmolol drip and night pride drip with a goal of systolic 277 and heart rate less than 80.  There is no endoleak, rupture.  Pain control initiated.     Varney Biles, MD 05/08/19 [phone]

## 2019-05-08 LAB — CBC WITH DIFFERENTIAL/PLATELET
Abs Immature Granulocytes: 0.03 10*3/uL (ref 0.00–0.07)
Basophils Absolute: 0 10*3/uL (ref 0.0–0.1)
Basophils Relative: 0 %
Eosinophils Absolute: 0.1 10*3/uL (ref 0.0–0.5)
Eosinophils Relative: 1 %
HCT: 32.1 % — ABNORMAL LOW (ref 39.0–52.0)
Hemoglobin: 10.3 g/dL — ABNORMAL LOW (ref 13.0–17.0)
Immature Granulocytes: 0 %
Lymphocytes Relative: 7 %
Lymphs Abs: 0.6 10*3/uL — ABNORMAL LOW (ref 0.7–4.0)
MCH: 29.4 pg (ref 26.0–34.0)
MCHC: 32.1 g/dL (ref 30.0–36.0)
MCV: 91.7 fL (ref 80.0–100.0)
Monocytes Absolute: 0.4 10*3/uL (ref 0.1–1.0)
Monocytes Relative: 5 %
Neutro Abs: 6.9 10*3/uL (ref 1.7–7.7)
Neutrophils Relative %: 87 %
Platelets: 282 10*3/uL (ref 150–400)
RBC: 3.5 MIL/uL — ABNORMAL LOW (ref 4.22–5.81)
RDW: 16.8 % — ABNORMAL HIGH (ref 11.5–15.5)
WBC: 8 10*3/uL (ref 4.0–10.5)
nRBC: 0 % (ref 0.0–0.2)

## 2019-05-08 LAB — BASIC METABOLIC PANEL
Anion gap: 12 (ref 5–15)
BUN: 23 mg/dL (ref 8–23)
CO2: 23 mmol/L (ref 22–32)
Calcium: 9.4 mg/dL (ref 8.9–10.3)
Chloride: 100 mmol/L (ref 98–111)
Creatinine, Ser: 1.26 mg/dL — ABNORMAL HIGH (ref 0.61–1.24)
GFR calc Af Amer: >60 mL/min (ref >60)
GFR calc non Af Amer: 57 mL/min — ABNORMAL LOW (ref >60)
Glucose, Bld: 110 mg/dL — ABNORMAL HIGH (ref 70–99)
Potassium: 4.2 mmol/L (ref 3.5–5.1)
Sodium: 135 mmol/L (ref 135–145)

## 2019-05-08 MED ORDER — MORPHINE SULFATE (PF) 4 MG/ML IV SOLN
4.0000 mg | Freq: Once | INTRAVENOUS | Status: AC
  Administered 2019-05-08: 4 mg via INTRAVENOUS
  Filled 2019-05-08: qty 1

## 2019-05-08 NOTE — ED Provider Notes (Signed)
Patient care assumed at 0700.  Pt with hx/o aneurysm repair here with abdominal pain, constipation.  CTA with new aneurysm.  BP controlled.  Pt pending transfer to [facility].  Delay in transfer due to need for critical care transport and weather conditions.  Pt with some recurrent abdominal pain, will treat with morphine.      Quintella Reichert, MD 05/08/19 1537

## 2019-05-08 NOTE — ED Notes (Signed)
[facility] Called stating Patrick Adams will be here to pick pt up

## 2019-05-18 ENCOUNTER — Ambulatory Visit: Payer: Medicare HMO | Admitting: Internal Medicine

## 2019-05-18 ENCOUNTER — Encounter: Payer: Self-pay | Admitting: Internal Medicine

## 2019-05-18 ENCOUNTER — Other Ambulatory Visit: Payer: Self-pay

## 2019-05-18 VITALS — BP 126/68 | HR 62 | Temp 98.1°F | Ht 72.0 in | Wt 144.0 lb

## 2019-05-18 DIAGNOSIS — K56609 Unspecified intestinal obstruction, unspecified as to partial versus complete obstruction: Secondary | ICD-10-CM

## 2019-05-18 DIAGNOSIS — E43 Unspecified severe protein-calorie malnutrition: Secondary | ICD-10-CM | POA: Diagnosis not present

## 2019-05-18 DIAGNOSIS — Z7902 Long term (current) use of antithrombotics/antiplatelets: Secondary | ICD-10-CM

## 2019-05-18 DIAGNOSIS — R195 Other fecal abnormalities: Secondary | ICD-10-CM | POA: Diagnosis not present

## 2019-05-18 DIAGNOSIS — K5909 Other constipation: Secondary | ICD-10-CM

## 2019-05-18 MED ORDER — GENERIC EXTERNAL MEDICATION
Status: DC
Start: ? — End: 2019-05-18

## 2019-05-18 MED ORDER — GENERIC EXTERNAL MEDICATION
12.50 | Status: DC
Start: ? — End: 2019-05-18

## 2019-05-18 MED ORDER — PHENOL 1.4 % MT LIQD
2.00 | OROMUCOSAL | Status: DC
Start: ? — End: 2019-05-18

## 2019-05-18 MED ORDER — NICOTINE 7 MG/24HR TD PT24
1.00 | MEDICATED_PATCH | TRANSDERMAL | Status: DC
Start: 2019-05-17 — End: 2019-05-18

## 2019-05-18 MED ORDER — LABETALOL HCL 5 MG/ML IV SOLN
10.00 | INTRAVENOUS | Status: DC
Start: ? — End: 2019-05-18

## 2019-05-18 MED ORDER — INFLUENZA VAC SPLIT QUAD 0.5 ML IM SUSY
0.50 | PREFILLED_SYRINGE | INTRAMUSCULAR | Status: DC
Start: ? — End: 2019-05-18

## 2019-05-18 MED ORDER — BISACODYL 10 MG RE SUPP
10.00 | RECTAL | Status: DC
Start: 2019-05-17 — End: 2019-05-18

## 2019-05-18 MED ORDER — HEPARIN SODIUM (PORCINE) 5000 UNIT/ML IJ SOLN
5000.00 | INTRAMUSCULAR | Status: DC
Start: 2019-05-16 — End: 2019-05-18

## 2019-05-18 MED ORDER — LUBIPROSTONE 24 MCG PO CAPS: 24.0000 ug | ORAL_CAPSULE | Freq: Two times a day (BID) | ORAL | 3 refills | Status: DC

## 2019-05-18 MED ORDER — SODIUM CHLORIDE FLUSH 0.9 % IV SOLN
10.00 | INTRAVENOUS | Status: DC
Start: 2019-05-16 — End: 2019-05-18

## 2019-05-18 MED ORDER — THERA-M PO TABS
1.00 | ORAL_TABLET | ORAL | Status: DC
Start: 2019-05-17 — End: 2019-05-18

## 2019-05-18 MED ORDER — ONDANSETRON HCL 4 MG/2ML IJ SOLN
4.00 | INTRAMUSCULAR | Status: DC
Start: ? — End: 2019-05-18

## 2019-05-18 NOTE — Patient Instructions (Addendum)
Please drink 2 containers a day of Ensure or Boost per Dr Carlean Purl.   We have sent the following medications to your pharmacy for you to pick up at your convenience: Amitiza   Please follow up with Dr Carlean Purl 06/16/2019 at 9:30AM.   I appreciate the opportunity to care for you. Silvano Rusk, MD, [facility]

## 2019-05-18 NOTE — Progress Notes (Signed)
Patrick Adams 74 y.o. March 26, 1946 [id number]  Assessment & Plan:   Encounter Diagnoses  Name Primary?  . Chronic constipation Yes  . Heme + stool   . Small bowel obstruction ([place]) - resolved   . Protein-calorie malnutrition, severe ([place])   . Long term current use of antithrombotics/antiplatelets   . Hereditary hemochromatosis ([place])      Complicated patient status post recent repair of new aneurysm and leaking stent for thoracic aortic aneurysm, with chronic constipation problems, heme positive stool, weight loss and failure to thrive like scenario will also with a recent acute small bowel obstruction treated conservatively.  He does not seem to grasp his overall situation well and is a poor historian and seems to have limited insight into his problems.  He is not a good candidate for a colonoscopy at this time though he qualifies for 1 with his problems.  He needs to supplement with boost and/or Ensure he needs to eat more, I want him to have his follow-up at [facility] for his vascular problems and aneurysms and I will see him back and consider possible colonoscopy.  I have prescribed Amitiza 24 mcg twice daily.  If this is unaffordable would use MiraLAX 2 doses daily and bisacodyl every other day Boost/ensure Resume diet Amitiza - if not dulcolax  Re: hereditary hemochromatosis he needs to get back to hematology and phlebotomy I am not sure how to interpret the elevated ferritin in face of his acute illness and anemia so timing of phlebotomy per hematology and not urgent as ? Wait until Hgb back to normal - suspect so  PATIENT WAS ADMITTED SUBSEQUENT TO THIS VISIT W/ ANOTHER SBO  Cc;Patrick Pries, MD   Subjective:   Chief Complaint: Constipation, impaction  HPI The patient is a 74 year old white man with complicated past medical history involving aneurysms including repairs, coronary artery disease and STEMI, peripheral artery disease, on Plavix and a reported history of  hemochromatosis.  More recent history notable for four-vessel branch repair of a thoracic go abdominal aneurysm and he presented with a type I a endoleak and proximal aneurysmal degeneration with proximal extension.  He was seen in [place] and transferred to [facility] for treatment.  He had transcatheter repair of this type III endoleak by vascular surgery.  While there he had a small bowel obstruction which was treated with an NG tube decompression and spontaneous recovery.  He seems fixated on that blockage and thinks it needs to be fixed.  He is a poor historian who wanders from topic to topic but generally is focused on chronic constipation stating that he has not "moved my bowels since November".  When questioned further he means he had to take laxatives.  If he takes 3 Dulcolax he will have defecation.  He is used an enema on occasion.  His primary care provider has checked Hemoccults and he is I fob positive.  He reports having had a colonoscopy about 5 years ago.  He has not been eating well he has lost a great deal of weight with these illnesses and hospitalizations.  He does have boost or Ensure at home and knows to take that.  Colonoscopy was reportedly in [place] we do not have records.  Denies any overt bleeding.  Apparently has a history of hemochromatosis and has undergone phlebotomy.  This is performed through [place].  A note from 2019 that I found shows his most recent colonoscopy was May 2016.  Ferritin was 647 on April 19, 2019  it was 45 in August 2019 hemoglobin 9.2 on May 16, 2019 no evidence of cirrhosis in the chart or from imaging Allergies  Allergen Reactions  . Brilinta [Ticagrelor] Shortness Of Breath     Patient was taking 60 mg  Twice a day - changed to Clopidogrel 75 mg  03/02/19  . Ciprofloxacin Anaphylaxis  . Statins Other (See Comments)    Joint pain.   No outpatient medications have been marked as taking for the 05/18/19 encounter (Office Visit) with Patrick Mayer, MD.   Past Medical History:  Diagnosis Date  . AAA (abdominal aortic aneurysm) ([place])   . CAD S/P PCI 01/2018   a) 01/31/2018: p-mLAD 60-100% (DES PCI with 2.75 mm x 38 mm Synergy DES - 4.0-3.6.3.0 mm tapered post-dilation); b) p-mRCA 55%-85%-40% - Synergy DES 3.5 mm x 32 mm -> 4.0 mm; & RPAV 90% --> Synergy DES 2.5 mm x 12 mm -> 3.0 mm;; c) stents patent on re-look Cath 03/19/2018  . Diverticulitis   . Erectile dysfunction   . Hemochromatosis   . History of abdominal aortic aneurysm (AAA) repair 2008   With thoracic aortic aneurysm being monitored  . Hyperlipidemia   . Hypertension   . Ischemic cardiomyopathy 01/31/2018   Initial post STEMI EF by echo 40 to 45% --> by cath 1 month later LV gram showed EF 55 to 60%.  . MDD (major depressive disorder)   . Nephrolithiasis   . PAD (peripheral artery disease) ([place])   . Prostate nodule   . ST elevation (STEMI) myocardial infarction involving left anterior descending coronary artery ([place]) 01/31/2018   Found to have pLAD 60%-mLAD 100% (DES PCI), distal LAD diffuse 60-70% (med Rx), OM1 65%; also signifiicant p-m RCA & RPAV disease  --> Initial EF ~45% with elevated LVEDP  . Thoracic aortic aneurysm [facility]) - descending (residual after AAA repair) 2008   Past Surgical History:  Procedure Laterality Date  . ABDOMINAL AORTIC ANEURYSM REPAIR  2008  . ABDOMINAL AORTIC ANEURYSM REPAIR    . COLONOSCOPY  2016  . CORONARY STENT INTERVENTION N/A 02/03/2018   Procedure: CORONARY STENT INTERVENTION;  Surgeon: Belva Crome, MD;  Location: [facility] INVASIVE CV LAB;;   staged PCI p-mRCA (Synergy DES 3.5 mm x 32 mm--4.0 mm covering entire diseased segment from  proximal 55 through the 85 and 40% lesions in the mid vessel), RPAV (Synergy DES 2.5 mm x 12 mm--3.0 mm)  . CORONARY/GRAFT ACUTE MI REVASCULARIZATION N/A 01/31/2018   Procedure: Coronary/Graft Acute MI Revascularization;  Surgeon: Leonie Man, MD;  Location: [facility] CV LAB;; ANTERIOR-INFERIOR STEMI:  pLAD 60% - mLAD 100% (PCI-Synergy DES 2.75 mm at 38 mm--4.1-3.6-3.0 mm tapered post dilation)  . LEFT HEART CATH AND CORONARY ANGIOGRAPHY N/A 01/31/2018   Procedure: LEFT HEART CATH AND CORONARY ANGIOGRAPHY;  Surgeon: Leonie Man, MD;  Location: [facility] INVASIVE CV LAB;;; (anterior inferior STEMI) -> pLAD 60% - mLAD 100% (DES PCI); p-m RCA 55%, mRCA 85%, mRCA 40% (diffuse segmental disease), RPAV 90% (ulcerated), OM1 65%.  EF 35-45%, moderately elevated LVEDP --acute combined systolic and diastolic heart failure, ischemic cardiomyopathy  . LEFT HEART CATH AND CORONARY ANGIOGRAPHY N/A 02/03/2018   Procedure: LEFT HEART CATH AND CORONARY ANGIOGRAPHY;  Surgeon: Belva Crome, MD;  Location: [facility] CV LAB;  Service: Cardiovascular;  Laterality: N/A;  . LEFT HEART CATH AND CORONARY ANGIOGRAPHY N/A 03/19/2018   Procedure: LEFT HEART CATH AND CORONARY ANGIOGRAPHY;  Surgeon: Martinique, Peter M, MD;  Location: [facility]  INVASIVE CV LAB;  Service: Cardiovascular;  Patent stent in p-mLAD, patent p-m RCA stent and RPAV stent.  Distal LAD 70%, OM1 65%.  EF 55-60%.  . TRANSTHORACIC ECHOCARDIOGRAM  02/01/2018   (Insetting of anterior-inferior STEMI) --> moderate LVH.  EF 40 to 45%.  Anterior, anteroseptal and apical severe hypokinesis to akinesis.  Indeterminate LV filling pressures.  Aortic sclerosis, no stenosis.   Social History   Social History Narrative   Divorced, retired   Lives alone   1 daughter   Smoker   No drugs/EtOH   family history includes Colonic polyp in his mother; Heart disease in his father; Liver disease in his father.   Review of Systems As per HPI DOE, fatigue Pedal edema Increased thirst O/w neg  Objective:   Physical Exam BP 126/68   Pulse 62   Temp 98.1 F (36.7 C)   Ht 6' (1.829 m)   Wt 144 lb (65.3 kg)   BMI 19.53 kg/m  Thin chronically ill malnourished appearing elderly white man Some muscle wasting Flat affect Lungs clear Normal heart sounds somewhat distant The  abdomen is soft and nontender without organomegaly or mass Rectal exam shows some anal skin tags/old external hemorrhoids nontender no mass there is no impaction sphincter tone is slightly decreased  65 mins total time - records review, image review, direct contact with patient

## 2019-05-19 ENCOUNTER — Emergency Department (HOSPITAL_COMMUNITY): Payer: Medicare HMO

## 2019-05-19 ENCOUNTER — Encounter (HOSPITAL_COMMUNITY): Payer: Self-pay | Admitting: Emergency Medicine

## 2019-05-19 ENCOUNTER — Inpatient Hospital Stay (HOSPITAL_COMMUNITY): Payer: Medicare HMO

## 2019-05-19 ENCOUNTER — Inpatient Hospital Stay (HOSPITAL_COMMUNITY)
Admission: EM | Admit: 2019-05-19 | Discharge: 2019-05-24 | DRG: 389 | Disposition: A | Discharge status: Home / Self Care | Payer: Medicare HMO | Attending: Family Medicine | Admitting: Family Medicine

## 2019-05-19 DIAGNOSIS — K9131 Postprocedural partial intestinal obstruction: Principal | ICD-10-CM | POA: Diagnosis present

## 2019-05-19 DIAGNOSIS — I509 Heart failure, unspecified: Secondary | ICD-10-CM

## 2019-05-19 DIAGNOSIS — F17210 Nicotine dependence, cigarettes, uncomplicated: Secondary | ICD-10-CM | POA: Diagnosis present

## 2019-05-19 DIAGNOSIS — F329 Major depressive disorder, single episode, unspecified: Secondary | ICD-10-CM | POA: Diagnosis present

## 2019-05-19 DIAGNOSIS — N402 Nodular prostate without lower urinary tract symptoms: Secondary | ICD-10-CM | POA: Diagnosis present

## 2019-05-19 DIAGNOSIS — I5042 Chronic combined systolic (congestive) and diastolic (congestive) heart failure: Secondary | ICD-10-CM | POA: Diagnosis present

## 2019-05-19 DIAGNOSIS — I2510 Atherosclerotic heart disease of native coronary artery without angina pectoris: Secondary | ICD-10-CM | POA: Diagnosis present

## 2019-05-19 DIAGNOSIS — Z8249 Family history of ischemic heart disease and other diseases of the circulatory system: Secondary | ICD-10-CM

## 2019-05-19 DIAGNOSIS — Z79899 Other long term (current) drug therapy: Secondary | ICD-10-CM

## 2019-05-19 DIAGNOSIS — I255 Ischemic cardiomyopathy: Secondary | ICD-10-CM | POA: Diagnosis present

## 2019-05-19 DIAGNOSIS — I712 Thoracic aortic aneurysm, without rupture: Secondary | ICD-10-CM | POA: Diagnosis present

## 2019-05-19 DIAGNOSIS — Z681 Body mass index (BMI) 19 or less, adult: Secondary | ICD-10-CM | POA: Diagnosis not present

## 2019-05-19 DIAGNOSIS — Z881 Allergy status to other antibiotic agents status: Secondary | ICD-10-CM

## 2019-05-19 DIAGNOSIS — E441 Mild protein-calorie malnutrition: Secondary | ICD-10-CM | POA: Diagnosis present

## 2019-05-19 DIAGNOSIS — I252 Old myocardial infarction: Secondary | ICD-10-CM | POA: Diagnosis not present

## 2019-05-19 DIAGNOSIS — K56609 Unspecified intestinal obstruction, unspecified as to partial versus complete obstruction: Secondary | ICD-10-CM | POA: Diagnosis present

## 2019-05-19 DIAGNOSIS — Z87442 Personal history of urinary calculi: Secondary | ICD-10-CM

## 2019-05-19 DIAGNOSIS — D649 Anemia, unspecified: Secondary | ICD-10-CM | POA: Diagnosis present

## 2019-05-19 DIAGNOSIS — I5041 Acute combined systolic (congestive) and diastolic (congestive) heart failure: Secondary | ICD-10-CM | POA: Diagnosis not present

## 2019-05-19 DIAGNOSIS — I10 Essential (primary) hypertension: Secondary | ICD-10-CM | POA: Diagnosis not present

## 2019-05-19 DIAGNOSIS — I110 Hypertensive heart disease with heart failure: Secondary | ICD-10-CM | POA: Diagnosis present

## 2019-05-19 DIAGNOSIS — Z20822 Contact with and (suspected) exposure to covid-19: Secondary | ICD-10-CM | POA: Diagnosis present

## 2019-05-19 DIAGNOSIS — E871 Hypo-osmolality and hyponatremia: Secondary | ICD-10-CM | POA: Diagnosis present

## 2019-05-19 DIAGNOSIS — K5901 Slow transit constipation: Secondary | ICD-10-CM | POA: Diagnosis not present

## 2019-05-19 DIAGNOSIS — Z8679 Personal history of other diseases of the circulatory system: Secondary | ICD-10-CM | POA: Diagnosis not present

## 2019-05-19 DIAGNOSIS — I739 Peripheral vascular disease, unspecified: Secondary | ICD-10-CM | POA: Diagnosis present

## 2019-05-19 DIAGNOSIS — Z7902 Long term (current) use of antithrombotics/antiplatelets: Secondary | ICD-10-CM | POA: Diagnosis not present

## 2019-05-19 DIAGNOSIS — Z888 Allergy status to other drugs, medicaments and biological substances status: Secondary | ICD-10-CM

## 2019-05-19 DIAGNOSIS — R64 Cachexia: Secondary | ICD-10-CM | POA: Diagnosis present

## 2019-05-19 DIAGNOSIS — R918 Other nonspecific abnormal finding of lung field: Secondary | ICD-10-CM | POA: Diagnosis present

## 2019-05-19 DIAGNOSIS — Z955 Presence of coronary angioplasty implant and graft: Secondary | ICD-10-CM | POA: Diagnosis not present

## 2019-05-19 DIAGNOSIS — Z0189 Encounter for other specified special examinations: Secondary | ICD-10-CM

## 2019-05-19 DIAGNOSIS — Z7982 Long term (current) use of aspirin: Secondary | ICD-10-CM

## 2019-05-19 DIAGNOSIS — E785 Hyperlipidemia, unspecified: Secondary | ICD-10-CM | POA: Diagnosis present

## 2019-05-19 DIAGNOSIS — K5900 Constipation, unspecified: Secondary | ICD-10-CM

## 2019-05-19 LAB — CBC WITH DIFFERENTIAL/PLATELET
Abs Immature Granulocytes: 0.08 10*3/uL — ABNORMAL HIGH (ref 0.00–0.07)
Basophils Absolute: 0 10*3/uL (ref 0.0–0.1)
Basophils Relative: 0 %
Eosinophils Absolute: 0 10*3/uL (ref 0.0–0.5)
Eosinophils Relative: 0 %
HCT: 40.2 % (ref 39.0–52.0)
Hemoglobin: 12.3 g/dL — ABNORMAL LOW (ref 13.0–17.0)
Immature Granulocytes: 1 %
Lymphocytes Relative: 4 %
Lymphs Abs: 0.6 10*3/uL — ABNORMAL LOW (ref 0.7–4.0)
MCH: 29.1 pg (ref 26.0–34.0)
MCHC: 30.6 g/dL (ref 30.0–36.0)
MCV: 95.3 fL (ref 80.0–100.0)
Monocytes Absolute: 0.6 10*3/uL (ref 0.1–1.0)
Monocytes Relative: 4 %
Neutro Abs: 14.6 10*3/uL — ABNORMAL HIGH (ref 1.7–7.7)
Neutrophils Relative %: 91 %
Platelets: 409 10*3/uL — ABNORMAL HIGH (ref 150–400)
RBC: 4.22 MIL/uL (ref 4.22–5.81)
RDW: 15.6 % — ABNORMAL HIGH (ref 11.5–15.5)
WBC: 15.9 10*3/uL — ABNORMAL HIGH (ref 4.0–10.5)
nRBC: 0 % (ref 0.0–0.2)

## 2019-05-19 LAB — COMPREHENSIVE METABOLIC PANEL
ALT: 12 U/L (ref 0–44)
AST: 16 U/L (ref 15–41)
Albumin: 2.8 g/dL — ABNORMAL LOW (ref 3.5–5.0)
Alkaline Phosphatase: 81 U/L (ref 38–126)
Anion gap: 13 (ref 5–15)
BUN: 20 mg/dL (ref 8–23)
CO2: 17 mmol/L — ABNORMAL LOW (ref 22–32)
Calcium: 9 mg/dL (ref 8.9–10.3)
Chloride: 103 mmol/L (ref 98–111)
Creatinine, Ser: 1.33 mg/dL — ABNORMAL HIGH (ref 0.61–1.24)
GFR calc Af Amer: >60 mL/min (ref >60)
GFR calc non Af Amer: 53 mL/min — ABNORMAL LOW (ref >60)
Glucose, Bld: 127 mg/dL — ABNORMAL HIGH (ref 70–99)
Potassium: 4.4 mmol/L (ref 3.5–5.1)
Sodium: 133 mmol/L — ABNORMAL LOW (ref 135–145)
Total Bilirubin: 0.7 mg/dL (ref 0.3–1.2)
Total Protein: 6.1 g/dL — ABNORMAL LOW (ref 6.5–8.1)

## 2019-05-19 LAB — SARS CORONAVIRUS 2 (TAT 6-24 HRS): SARS Coronavirus 2: NEGATIVE

## 2019-05-19 LAB — LIPASE, BLOOD: Lipase: 20 U/L (ref 11–51)

## 2019-05-19 LAB — LACTIC ACID, PLASMA: Lactic Acid, Venous: 0.6 mmol/L (ref 0.5–1.9)

## 2019-05-19 LAB — CBG MONITORING, ED: Glucose-Capillary: 122 mg/dL — ABNORMAL HIGH (ref 70–99)

## 2019-05-19 MED ORDER — DIATRIZOATE MEGLUMINE & SODIUM 66-10 % PO SOLN
90.0000 mL | Freq: Once | ORAL | Status: AC
  Administered 2019-05-20: 90 mL via NASOGASTRIC
  Filled 2019-05-19: qty 90

## 2019-05-19 MED ORDER — FENTANYL CITRATE (PF) 100 MCG/2ML IJ SOLN
50.0000 ug | Freq: Once | INTRAMUSCULAR | Status: AC
  Administered 2019-05-19: 50 ug via INTRAVENOUS
  Filled 2019-05-19: qty 2

## 2019-05-19 MED ORDER — IOHEXOL 350 MG/ML SOLN
100.0000 mL | Freq: Once | INTRAVENOUS | Status: AC | PRN
  Administered 2019-05-19: 100 mL via INTRAVENOUS

## 2019-05-19 MED ORDER — ASPIRIN EC 81 MG PO TBEC
81.0000 mg | DELAYED_RELEASE_TABLET | Freq: Every day | ORAL | Status: DC
  Administered 2019-05-19 – 2019-05-23 (×5): 81 mg via ORAL
  Filled 2019-05-19 (×5): qty 1

## 2019-05-19 MED ORDER — ENOXAPARIN SODIUM 40 MG/0.4ML ~~LOC~~ SOLN
40.0000 mg | SUBCUTANEOUS | Status: DC
  Administered 2019-05-19 – 2019-05-22 (×4): 40 mg via SUBCUTANEOUS
  Filled 2019-05-19 (×4): qty 0.4

## 2019-05-19 MED ORDER — CLOPIDOGREL BISULFATE 75 MG PO TABS
75.0000 mg | ORAL_TABLET | Freq: Every day | ORAL | Status: DC
  Administered 2019-05-19 – 2019-05-23 (×5): 75 mg via ORAL
  Filled 2019-05-19 (×5): qty 1

## 2019-05-19 MED ORDER — ONDANSETRON HCL 4 MG/2ML IJ SOLN
4.0000 mg | Freq: Four times a day (QID) | INTRAMUSCULAR | Status: DC | PRN
  Administered 2019-05-19: 23:00:00 4 mg via INTRAVENOUS
  Filled 2019-05-19: qty 2

## 2019-05-19 MED ORDER — SODIUM CHLORIDE 0.9 % IV SOLN: Freq: Once | INTRAVENOUS | Status: DC

## 2019-05-19 MED ORDER — ONDANSETRON HCL 4 MG/2ML IJ SOLN
4.0000 mg | Freq: Once | INTRAMUSCULAR | Status: AC
  Administered 2019-05-19: 4 mg via INTRAVENOUS
  Filled 2019-05-19: qty 2

## 2019-05-19 MED ORDER — SODIUM CHLORIDE 0.9 % IV SOLN: INTRAVENOUS | Status: DC

## 2019-05-19 MED ORDER — NITROGLYCERIN 0.4 MG SL SUBL: 0.4000 mg | SUBLINGUAL_TABLET | SUBLINGUAL | Status: DC | PRN

## 2019-05-19 MED ORDER — FLEET ENEMA 7-19 GM/118ML RE ENEM: 1.0000 | ENEMA | Freq: Once | RECTAL | Status: DC | PRN

## 2019-05-19 MED ORDER — LABETALOL HCL 5 MG/ML IV SOLN
10.0000 mg | Freq: Once | INTRAVENOUS | Status: AC
  Administered 2019-05-20: 03:00:00 10 mg via INTRAVENOUS
  Filled 2019-05-19: qty 4

## 2019-05-19 MED ORDER — LOSARTAN POTASSIUM 25 MG PO TABS
25.0000 mg | ORAL_TABLET | Freq: Every day | ORAL | Status: DC
  Administered 2019-05-19 – 2019-05-23 (×5): 25 mg via ORAL
  Filled 2019-05-19 (×5): qty 1

## 2019-05-19 MED ORDER — BISACODYL 10 MG RE SUPP
10.0000 mg | Freq: Once | RECTAL | Status: AC
  Administered 2019-05-23: 10 mg via RECTAL
  Filled 2019-05-19 (×2): qty 1

## 2019-05-19 MED ORDER — ACETAMINOPHEN 10 MG/ML IV SOLN
1000.0000 mg | Freq: Four times a day (QID) | INTRAVENOUS | Status: DC
  Administered 2019-05-19 – 2019-05-20 (×3): 1000 mg via INTRAVENOUS
  Filled 2019-05-19 (×3): qty 100

## 2019-05-19 NOTE — ED Triage Notes (Addendum)
Pt here  From home via ems lives byhimself  with c/o  abd pain  And constipation after Triple A repair , 2/6 at [facility], states he  Called his dr and  Was told to take a laxative which he did and to eat reg food , as he had this issue  Of constipation before the Triple A , pt states he has only home x 2 days from hospital.

## 2019-05-19 NOTE — H&P (Signed)
Patrick Adams is an 74 y.o. male.   Chief Complaint: Abdominal pain HPI: Patient is a 74 year old male who comes in with a history of a AAA repair, recent stent placement at [facility] discharged 2 days ago.  Patient states that during that hospital stay he did have some nausea vomiting and obstructive type symptoms.  Per the patient he was treated for ileus.  Patient states he had NG tube placed for 3 days and then felt good enough to go home.  Patient states that today he had nausea and vomiting and abdominal pain with abdominal distention.  Patient underwent CT scan in the ER which was found to have very likely mechanical partial small bowel obstruction.  On his CT scan that I reviewed there is contrast within the colon.  Discussed with the patient he did have contrast given to him orally approximately 3 days ago at [facility].  Of note the patient states he has not had any bowel movement since November 2020, 3 months worth.  He states he has passed some gas.  He states he was given enemas at [facility] with minimal return of stool.  Patient has had previous open AAA repair in 2008, and incisional hernia repair with mesh in 2009.  He recently saw Dr. Carlean Purl of [place] yesterday.  Patient currently on aspirin and Plavix for his stents.  Past Medical History:  Diagnosis Date  . AAA (abdominal aortic aneurysm) ([place])   . CAD S/P PCI 01/2018   a) 01/31/2018: p-mLAD 60-100% (DES PCI with 2.75 mm x 38 mm Synergy DES - 4.0-3.6.3.0 mm tapered post-dilation); b) p-mRCA 55%-85%-40% - Synergy DES 3.5 mm x 32 mm -> 4.0 mm; & RPAV 90% --> Synergy DES 2.5 mm x 12 mm -> 3.0 mm;; c) stents patent on re-look Cath 03/19/2018  . Diverticulitis   . Erectile dysfunction   . Hemochromatosis   . History of abdominal aortic aneurysm (AAA) repair 2008   With thoracic aortic aneurysm being monitored  . Hyperlipidemia   . Hypertension   . Ischemic cardiomyopathy 01/31/2018   Initial post STEMI EF by echo 40 to 45% --> by cath 1  month later LV gram showed EF 55 to 60%.  . MDD (major depressive disorder)   . Nephrolithiasis   . PAD (peripheral artery disease) ([place])   . Prostate nodule   . ST elevation (STEMI) myocardial infarction involving left anterior descending coronary artery ([place]) 01/31/2018   Found to have pLAD 60%-mLAD 100% (DES PCI), distal LAD diffuse 60-70% (med Rx), OM1 65%; also signifiicant p-m RCA & RPAV disease  --> Initial EF ~45% with elevated LVEDP  . 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 ABDOMINAL AORTIC ANEURYSM REPAIR  2008  . ABDOMINAL AORTIC ANEURYSM REPAIR    . CORONARY STENT INTERVENTION N/A 02/03/2018   Procedure: CORONARY STENT INTERVENTION;  Surgeon: Belva Crome, MD;  Location: [facility] INVASIVE CV LAB;;   staged PCI p-mRCA (Synergy DES 3.5 mm x 32 mm--4.0 mm covering entire diseased segment from  proximal 55 through the 85 and 40% lesions in the mid vessel), RPAV (Synergy DES 2.5 mm x 12 mm--3.0 mm)  . CORONARY/GRAFT ACUTE MI REVASCULARIZATION N/A 01/31/2018   Procedure: Coronary/Graft Acute MI Revascularization;  Surgeon: Leonie Man, MD;  Location: [place] CV LAB;; ANTERIOR-INFERIOR STEMI: pLAD 60% - mLAD 100% (PCI-Synergy DES 2.75 mm at 38 mm--4.1-3.6-3.0 mm tapered post dilation)  .  LEFT HEART CATH AND CORONARY ANGIOGRAPHY N/A 01/31/2018   Procedure: LEFT HEART CATH AND CORONARY ANGIOGRAPHY;  Surgeon: Leonie Man, MD;  Location: [facility] INVASIVE CV LAB;;; (anterior inferior STEMI) -> pLAD 60% - mLAD 100% (DES PCI); p-m RCA 55%, mRCA 85%, mRCA 40% (diffuse segmental disease), RPAV 90% (ulcerated), OM1 65%.  EF 35-45%, moderately elevated LVEDP --acute combined systolic and diastolic heart failure, ischemic cardiomyopathy  . LEFT HEART CATH AND CORONARY ANGIOGRAPHY N/A 02/03/2018   Procedure: LEFT HEART CATH AND CORONARY ANGIOGRAPHY;  Surgeon: Belva Crome, MD;  Location: [place] CV LAB;   Service: Cardiovascular;  Laterality: N/A;  . LEFT HEART CATH AND CORONARY ANGIOGRAPHY N/A 03/19/2018   Procedure: LEFT HEART CATH AND CORONARY ANGIOGRAPHY;  Surgeon: Martinique, Peter M, MD;  Location: [place] CV LAB;  Service: Cardiovascular;  Patent stent in p-mLAD, patent p-m RCA stent and RPAV stent.  Distal LAD 70%, OM1 65%.  EF 55-60%.  . TRANSTHORACIC ECHOCARDIOGRAM  02/01/2018   (Insetting of anterior-inferior STEMI) --> moderate LVH.  EF 40 to 45%.  Anterior, anteroseptal and apical severe hypokinesis to akinesis.  Indeterminate LV filling pressures.  Aortic sclerosis, no stenosis.    Family History  Problem Relation Age of Onset  . Colonic polyp Mother   . Heart disease Father   . Liver disease Father    Social History:  reports that he has been smoking cigarettes. He has been smoking about 0.50 packs per day. He has never used smokeless tobacco. He reports that he does not drink alcohol or use drugs.  Allergies:  Allergies  Allergen Reactions  . Brilinta [Ticagrelor] Shortness Of Breath     Patient was taking 60 mg  Twice a day - changed to Clopidogrel 75 mg  03/02/19  . Ciprofloxacin Anaphylaxis  . Statins Other (See Comments)    Joint pain.    (Not in a hospital admission)   Results for orders placed or performed during the hospital encounter of 05/19/19 (from the past 48 hour(s))  Comprehensive metabolic panel     Status: Abnormal   Collection Time: 05/19/19  1:25 PM  Result Value Ref Range   Sodium 133 (L) 135 - 145 mmol/L   Potassium 4.4 3.5 - 5.1 mmol/L   Chloride 103 98 - 111 mmol/L   CO2 17 (L) 22 - 32 mmol/L   Glucose, Bld 127 (H) 70 - 99 mg/dL   BUN 20 8 - 23 mg/dL   Creatinine, Ser 1.33 (H) 0.61 - 1.24 mg/dL   Calcium 9.0 8.9 - 10.3 mg/dL   Total Protein 6.1 (L) 6.5 - 8.1 g/dL   Albumin 2.8 (L) 3.5 - 5.0 g/dL   AST 16 15 - 41 U/L   ALT 12 0 - 44 U/L   Alkaline Phosphatase 81 38 - 126 U/L   Total Bilirubin 0.7 0.3 - 1.2 mg/dL   GFR calc non Af Amer  53 (L) >60 mL/min   GFR calc Af Amer >60 >60 mL/min   Anion gap 13 5 - 15    Comment: Performed at [facility], [address], [place], [place] [address]  Lipase, blood     Status: None   Collection Time: 05/19/19  1:25 PM  Result Value Ref Range   Lipase 20 11 - 51 U/L    Comment: Performed at [facility], [place] [address] [place], [place] [address]  CBC with Differential     Status: Abnormal   Collection Time: 05/19/19  1:25 PM  Result Value Ref Range   WBC 15.9 (H) 4.0 - 10.5 K/uL   RBC 4.22 4.22 - 5.81 MIL/uL   Hemoglobin 12.3 (L) 13.0 - 17.0 g/dL   HCT 40.2 39.0 - 52.0 %   MCV 95.3 80.0 - 100.0 fL   MCH 29.1 26.0 - 34.0 pg   MCHC 30.6 30.0 - 36.0 g/dL   RDW 15.6 (H) 11.5 - 15.5 %   Platelets 409 (H) 150 - 400 K/uL   nRBC 0.0 0.0 - 0.2 %   Neutrophils Relative % 91 %   Neutro Abs 14.6 (H) 1.7 - 7.7 K/uL   Lymphocytes Relative 4 %   Lymphs Abs 0.6 (L) 0.7 - 4.0 K/uL   Monocytes Relative 4 %   Monocytes Absolute 0.6 0.1 - 1.0 K/uL   Eosinophils Relative 0 %   Eosinophils Absolute 0.0 0.0 - 0.5 K/uL   Basophils Relative 0 %   Basophils Absolute 0.0 0.0 - 0.1 K/uL   Immature Granulocytes 1 %   Abs Immature Granulocytes 0.08 (H) 0.00 - 0.07 K/uL    Comment: Performed at [facility], [address], [place], [place] [address]  CBG monitoring, ED     Status: Abnormal   Collection Time: 05/19/19  1:33 PM  Result Value Ref Range   Glucose-Capillary 122 (H) 70 - 99 mg/dL   DG Abd Acute W/Chest  Result Date: 05/19/2019 CLINICAL DATA:  Chronic 8 month history of midline abdominal pain associated with nausea and vomiting. Prior thoracic and abdominal aortic stent grafting. EXAM: DG ABDOMEN ACUTE W/ 1V CHEST COMPARISON:  CT angio chest abdomen and pelvis 05/07/2019. Chest x-ray 02/01/2018. FINDINGS: Mild gaseous distension of adjacent loops of jejunum in the LEFT UPPER QUADRANT demonstrating air-fluid levels on the ERECT image. No free intraperitoneal air.  Expected stool burden and residual contrast material throughout the normal caliber and decompressed colon, with numerous diverticula involving the sigmoid colon; it is unclear where the contrast material originated, as it was not given for the recent CTA and there are no interval imaging studies since then. Thoracic and abdominal aortic stent grafting with stenting of the renal arteries. Cardiac silhouette mildly enlarged, unchanged. The opacity in the superior segment RIGHT LOWER LOBE identified on the recent CTA has improved is visible though much less conspicuous on the chest x-ray. Linear scarring is present in the RIGHT LOWER LOBE in a vertical orientation, unchanged. New linear atelectasis at the LEFT lung base. IMPRESSION: 1. Partial small bowel obstruction involving the jejunum in the LEFT UPPER QUADRANT. No free intraperitoneal air. 2. Diverticulosis involving the sigmoid colon. 3. The superior segment RIGHT LOWER LOBE opacity identified on the recent CTA is still present though much less conspicuous on chest x-ray. The recommendation for a follow-up CT in 3 months, early May, 2021, remains. 4. New atelectasis involving the LEFT lung base. No acute cardiopulmonary disease otherwise. Electronically Signed   By: Evangeline Dakin M.D.   On: 05/19/2019 13:37   CT Angio Chest/Abd/Pel for Dissection W and/or W/WO  Result Date: 05/19/2019 CLINICAL DATA:  History of thoracoabdominal aortic aneurysm status post stent graft repair complicated by endoleak requiring additional stent graft placement 05/07/2019. Patient presents with abdominal pain. EXAM: CT ANGIOGRAPHY CHEST, ABDOMEN AND PELVIS TECHNIQUE: Multidetector CT imaging through the chest, abdomen and pelvis was performed using the standard protocol during bolus administration of intravenous contrast. Multiplanar reconstructed images and MIPs were obtained and reviewed to evaluate the vascular anatomy. CONTRAST:  188mL OMNIPAQUE  IOHEXOL 350 MG/ML SOLN  COMPARISON:  05/07/2019 CT angiogram of the chest, abdomen and pelvis. FINDINGS: CTA CHEST FINDINGS Cardiovascular: Normal heart size. No significant pericardial effusion/thickening. Left anterior descending and right coronary atherosclerosis. Atherosclerotic thoracic aorta. Long segment thoracoabdominal aortic aneurysm extending from the mid descending thoracic aorta into the infrarenal abdominal aorta status post stent graft repair, with interval placement of stent grafts in entire descending thoracic aorta with previous stent graft in the abdominal aorta. Descending thoracic aorta is tortuous. The lower descending thoracic aortic aneurysm sac measures 8.1 cm maximum diameter (series 11/image 87), compared to 8.3 cm on 05/07/2019, slightly decreased. Tiny focus of nonspecific gas within descending thoracic aortic aneurysm sac. No acute intramural hematoma or acute dissection in the thoracic aorta. Aortic arch branch vessels are patent. No appreciable endoleak in the thoracic aorta. Normal caliber main pulmonary artery. No central pulmonary emboli. Mediastinum/Nodes: No discrete thyroid nodules. Unremarkable esophagus. No pathologically enlarged axillary, mediastinal or hilar lymph nodes. Lungs/Pleura: No pneumothorax. Small dependent bilateral pleural effusions. Mild centrilobular and paraseptal emphysema. Stable 4 mm basilar right upper lobe solid pulmonary nodule along the minor fissure (series 5/image 34). Sub solid 4.6 x 2.5 cm masslike opacity in the superior segment right lower lobe (series 5/image 29) with 2.4 cm solid component, unchanged since 05/07/2019 chest CT using similar measurement technique. Passive atelectasis in the left greater than right lower lobes. No new significant pulmonary nodules. Musculoskeletal: No aggressive appearing focal osseous lesions. Mild thoracic spondylosis. Review of the MIP images confirms the above findings. CTA ABDOMEN AND PELVIS FINDINGS VASCULAR Aorta: Prior abdominal  aortic stent graft repair long segment suprarenal and infrarenal abdominal aortic aneurysm, with maximum abdominal aortic aneurysm sac diameter measuring 6.7 cm in the suprarenal abdominal aorta (series 11/image 81), unchanged. No evidence of an endoleak in abdominal aorta. Prior aorto bi-iliac surgical graft repair, which appears patent. Celiac: Stable stent within the celiac trunk, which appears patent. SMA: Stable proximal SMA stent, which demonstrates approximately 50% luminal narrowing at the origin (series 14/image 119), slightly worsened since 05/07/2019. SMA patent distal to the SMA stent with no thrombosis. Renals: Stable appearance of the bilateral proximal renal artery stents, which appear patent without high-grade stenosis. IMA: Chronic occlusion. Inflow: No high-grade iliac graft or common femoral artery stenoses. 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 Veins: No obvious venous abnormality within the limitations of this arterial phase study. Review of the MIP images confirms the above findings. NON-VASCULAR Hepatobiliary: Normal liver size. Stable subcentimeter hypodense anterior liver lesion, too small to characterize. No new liver lesions. Cholecystectomy. No biliary ductal dilatation. Pancreas: Normal, with no mass or duct dilation. Spleen: Normal size. No mass. Adrenals/Urinary Tract: Normal adrenals. Stable scattered subcentimeter hypodense renal cortical lesions, too small to characterize, requiring no follow-up. No new renal lesions. No hydronephrosis. Normal bladder. Stomach/Bowel: Normal non-distended stomach. Several mildly dilated small bowel loops throughout the abdomen up to 3.1 cm diameter, with air-fluid levels. No definite small bowel wall thickening or pneumatosis. Distal small bowel is collapsed. No discrete small bowel caliber transition identified. Appendix not discretely visualized. Mild sigmoid diverticulosis, with no large bowel wall thickening or significant  pericolonic fat stranding. Vascular/Lymphatic: No pathologically enlarged lymph nodes in the abdomen or pelvis. Reproductive: Mild prostatomegaly. Nonspecific internal prostatic calcifications. Other: No pneumoperitoneum. Small volume ascites, mildly increased from prior. No focal fluid collections. Musculoskeletal: No aggressive appearing focal osseous lesions. Moderate thoracic spondylosis. Review of the MIP images confirms the above findings. IMPRESSION: 1. Mildly dilated small  bowel loops throughout the abdomen with collapsed distal small bowel. Findings are concerning for mild mechanical distal small bowel obstruction, although no discrete caliber transition is identified. Differential includes adynamic ileus. No free air. 2. Subsolid 4.6 cm superior segment right lower lobe masslike opacity with 2.4 cm solid component, persistent and unchanged since recent CT. Primary bronchogenic carcinoma not excluded in this high risk patient with smoking related changes in the lungs. Outpatient PET-CT suggested for further evaluation when clinically feasible. 3. 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 No evidence of an endoleak. The lower descending thoracic aortic aneurysm sac measures 8.1 cm maximum diameter, slightly decreased. Tiny focus of nonspecific gas within the descending thoracic aortic aneurysm sac, presumably due to recent intervention. 4. Narrowing of the SMA stent at the origin, approximately 50%, appearing slightly worsened. SMA remains patent. 5. Small dependent bilateral pleural effusions. Passive atelectasis in the left greater than right lower lobes. 6. Aortic Atherosclerosis (ICD10-I70.0) and Emphysema (ICD10-J43.9). Electronically Signed   By: Ilona Sorrel M.D.   On: 05/19/2019 15:47    Review of Systems  Constitutional: Negative for chills and fever.  HENT: Negative for ear discharge,  hearing loss and sore throat.   Eyes: Negative for discharge.  Respiratory: Negative for cough and shortness of breath.   Cardiovascular: Negative for chest pain and leg swelling.  Gastrointestinal: Positive for abdominal pain, nausea and vomiting. Negative for constipation and diarrhea.  Musculoskeletal: Negative for myalgias and neck pain.  Skin: Negative for rash.  Allergic/Immunologic: Negative for environmental allergies.  Neurological: Negative for dizziness and seizures.  Hematological: Does not bruise/bleed easily.  Psychiatric/Behavioral: Negative for suicidal ideas.  All other systems reviewed and are negative.   Blood pressure (!) 154/97, pulse 96, temperature 98.2 F (36.8 C), temperature source Oral, resp. rate 17, SpO2 98 %. Physical Exam  Constitutional: He is oriented to person, place, and time. Vital signs are normal. He appears well-developed and well-nourished.  Conversant No acute distress  Eyes: Lids are normal. No scleral icterus.  Pupils are equal round and reactive No lid lag Moist conjunctiva  Neck: No tracheal tenderness present. No thyromegaly present.  No cervical lymphadenopathy  Cardiovascular: Normal rate, regular rhythm and intact distal pulses.  No murmur heard. Respiratory: Effort normal and breath sounds normal. He has no wheezes. He has no rales.  GI: Soft. He exhibits no distension. There is no hepatosplenomegaly. There is abdominal tenderness (min). There is no rebound and no guarding. No hernia.    No hernias  Neurological: He is alert and oriented to person, place, and time.  Normal gait and station  Skin: Skin is warm. No rash noted. No cyanosis. Nails show no clubbing.  Normal skin turgor  Psychiatric: Judgment normal.  Appropriate affect     Assessment/Plan 74 year old male with ileus versus partial bowel obstruction versus hypokinetic GI tract. Patient Active Problem List   Diagnosis Date Noted  . Tobacco abuse   . Coronary  artery disease involving native coronary artery of native heart with angina pectoris ([place]) 01/31/2018  . Essential hypertension 01/31/2018  . Hyperlipidemia with target LDL less than 70 01/31/2018  . Acute ST elevation myocardial infarction (STEMI) involving left anterior descending (LAD) coronary artery ([place]) 01/31/2018  . Acute combined systolic and diastolic heart failure ([place]) 01/31/2018  . Ischemic cardiomyopathy 01/31/2018  . History of abdominal aortic aneurysm (AAA) repair 2008  . Thoracic aortic aneurysm ([place]) -  descending (residual after AAA repair) 2008    1.  Would recommend medical admission.  2.  Recommend NG tube and bowel regimen to include enemas, and small bowel obstruction protocol. 3.  No urgent need for surgery at this time. 4.  Would recommend GI consult for possible colonoscopy on this hospital stay as he has had a previous episode of diverticulitis within the past year which could be contributing to his obstructive type symptoms.  Ralene Ok, MD 05/19/2019, 5:05 PM

## 2019-05-19 NOTE — H&P (Signed)
History and Physical:    Patrick Adams   URK:[id number] DOB: 01-Sep-1945 DOA: 05/19/2019  Referring MD/provider: Dr. Phylliss Bob PCP: [name], MD   Patient coming from: Home  Chief Complaint: Abdominal pain  History of Present Illness:   Patrick Adams is an 74 y.o. 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  Patient was discharged 2 days ago.  Postop course was complicated by small bowel obstruction/ileus which was treated conservatively with NG tube with resolution of nausea and vomiting.  Patient was apparently able to eat reasonably before discharge without vomiting.  Patient states he did well until last night when he started having epigastric abdominal pain associated with nausea.  This morning he started vomiting and had increased abdominal pain and distention so came to the ED.  Patient denies fevers or chills.  No chest pain.  No blood in the vomitus.  Review of systems is notable for significant constipation.  He apparently has not had a bowel movement since November.  Notes he has tried multiple enemas as well as suppositories but "the only thing that comes out is the enema".   ED Course:  The patient was evaluated for recurrent AAA leak however CT angiogram was negative.  He was noted to have a jejunal small bowel obstruction that appeared mechanical.  He was seen by general surgery who recommended conservative management and admission by [facility] hospitalist with NG tube and bowel regimen.  ROS:   ROS   Review of Systems: General: Denies fever, chills, malaise,  Respiratory: Denies cough, SOB at rest or hemoptysis Cardiovascular: Denies chest pain or palpitations GU: Denies dysuria, frequency or hematuria Blood/lymphatics: Denies easy bruising or bleeding   Past Medical History:   Past Medical History:    Diagnosis Date  . AAA (abdominal aortic aneurysm) ([facility])   . CAD S/P PCI 01/2018   a) 01/31/2018: p-mLAD 60-100% (DES PCI with 2.75 mm x 38 mm Synergy DES - 4.0-3.6.3.0 mm tapered post-dilation); b) p-mRCA 55%-85%-40% - Synergy DES 3.5 mm x 32 mm -> 4.0 mm; & RPAV 90% --> Synergy DES 2.5 mm x 12 mm -> 3.0 mm;; c) stents patent on re-look Cath 03/19/2018  . Diverticulitis   . Erectile dysfunction   . Hemochromatosis   . History of abdominal aortic aneurysm (AAA) repair 2008   With thoracic aortic aneurysm being monitored  . Hyperlipidemia   . Hypertension   . Ischemic cardiomyopathy 01/31/2018   Initial post STEMI EF by echo 40 to 45% --> by cath 1 month later LV gram showed EF 55 to 60%.  . MDD (major depressive disorder)   . Nephrolithiasis   . PAD (peripheral artery disease) ([place])   . Prostate nodule   . ST elevation (STEMI) myocardial infarction involving left anterior descending coronary artery ([place]) 01/31/2018   Found to have pLAD 60%-mLAD 100% (DES PCI), distal LAD diffuse 60-70% (med Rx), OM1 65%; also signifiicant p-m RCA & RPAV disease  --> Initial EF ~45% with elevated LVEDP  . 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 ABDOMINAL AORTIC ANEURYSM REPAIR  2008  . ABDOMINAL AORTIC ANEURYSM REPAIR    . CORONARY STENT INTERVENTION N/A 02/03/2018   Procedure: CORONARY STENT INTERVENTION;  Surgeon: Belva Crome,  MD;  Location: [facility] INVASIVE CV LAB;;   staged PCI p-mRCA (Synergy DES 3.5 mm x 32 mm--4.0 mm covering entire diseased segment from  proximal 55 through the 85 and 40% lesions in the mid vessel), RPAV (Synergy DES 2.5 mm x 12 mm--3.0 mm)  . CORONARY/GRAFT ACUTE MI REVASCULARIZATION N/A 01/31/2018   Procedure: Coronary/Graft Acute MI Revascularization;  Surgeon: Leonie Man, MD;  Location: [facility] CV LAB;; ANTERIOR-INFERIOR STEMI: pLAD 60% - mLAD 100%  (PCI-Synergy DES 2.75 mm at 38 mm--4.1-3.6-3.0 mm tapered post dilation)  . LEFT HEART CATH AND CORONARY ANGIOGRAPHY N/A 01/31/2018   Procedure: LEFT HEART CATH AND CORONARY ANGIOGRAPHY;  Surgeon: Leonie Man, MD;  Location: [facility] INVASIVE CV LAB;;; (anterior inferior STEMI) -> pLAD 60% - mLAD 100% (DES PCI); p-m RCA 55%, mRCA 85%, mRCA 40% (diffuse segmental disease), RPAV 90% (ulcerated), OM1 65%.  EF 35-45%, moderately elevated LVEDP --acute combined systolic and diastolic heart failure, ischemic cardiomyopathy  . LEFT HEART CATH AND CORONARY ANGIOGRAPHY N/A 02/03/2018   Procedure: LEFT HEART CATH AND CORONARY ANGIOGRAPHY;  Surgeon: Belva Crome, MD;  Location: [place] CV LAB;  Service: Cardiovascular;  Laterality: N/A;  . LEFT HEART CATH AND CORONARY ANGIOGRAPHY N/A 03/19/2018   Procedure: LEFT HEART CATH AND CORONARY ANGIOGRAPHY;  Surgeon: Martinique, Peter M, MD;  Location: [place] CV LAB;  Service: Cardiovascular;  Patent stent in p-mLAD, patent p-m RCA stent and RPAV stent.  Distal LAD 70%, OM1 65%.  EF 55-60%.  . TRANSTHORACIC ECHOCARDIOGRAM  02/01/2018   (Insetting of anterior-inferior STEMI) --> moderate LVH.  EF 40 to 45%.  Anterior, anteroseptal and apical severe hypokinesis to akinesis.  Indeterminate LV filling pressures.  Aortic sclerosis, no stenosis.    Social History:   Social History   Socioeconomic History  . Marital status: Single    Spouse name: Not on file  . Number of children: Not on file  . Years of education: Not on file  . Highest education level: Not on file  Occupational History  . Not on file  Tobacco Use  . Smoking status: Current Every Day Smoker    Packs/day: 0.50    Types: Cigarettes  . Smokeless tobacco: Never Used  Substance and Sexual Activity  . Alcohol use: No  . Drug use: No  . Sexual activity: Not on file  Other Topics Concern  . Not on file  Social History Narrative  . Not on file   Social Determinants of Health   Financial  Resource Strain:   . Difficulty of Paying Living Expenses: Not on file  Food Insecurity:   . Worried About Charity fundraiser in the Last Year: Not on file  . Ran Out of Food in the Last Year: Not on file  Transportation Needs:   . Lack of Transportation (Medical): Not on file  . Lack of Transportation (Non-Medical): Not on file  Physical Activity:   . Days of Exercise per Week: Not on file  . Minutes of Exercise per Session: Not on file  Stress:   . Feeling of Stress : Not on file  Social Connections:   . Frequency of Communication with Friends and Family: Not on file  . Frequency of Social Gatherings with Friends and Family: Not on file  . Attends Religious Services: Not on file  . Active Member of Clubs or Organizations: Not on file  . Attends Archivist Meetings: Not on file  . Marital Status: Not on file  Intimate Partner Violence:   . Fear of Current or Ex-Partner: Not on file  . Emotionally Abused: Not on file  . Physically Abused: Not on file  . Sexually Abused: Not on file    Allergies   Brilinta [ticagrelor], Ciprofloxacin, and Statins  Family history:   Family History  Problem Relation Age of Onset  . Colonic polyp Mother   . Heart disease Father   . Liver disease Father     Current Medications:   Prior to Admission medications   Medication Sig Start Date End Date Taking? Authorizing Provider  acetaminophen (TYLENOL) 500 MG tablet Take 1,000 mg by mouth daily as needed for headache (pain).    Yes [provider]  aspirin EC 81 MG tablet Take 81 mg by mouth at bedtime.   Yes [provider]  cholecalciferol (VITAMIN D3) 25 MCG (1000 UT) tablet Take 1,000 Units by mouth daily.   Yes [provider]  clopidogrel (PLAVIX) 75 MG tablet Take 1 tablet (75 mg total) by mouth daily. Patient taking differently: Take 75 mg by mouth at bedtime.  03/02/19  Yes Leonie Man, MD  losartan (COZAAR) 25 MG tablet TAKE 1 TABLET BY  MOUTH EVERY DAY Patient taking differently: Take 25 mg by mouth at bedtime.  10/28/18  Yes Martinique, Peter M, MD  lubiprostone (AMITIZA) 24 MCG capsule Take 1 capsule (24 mcg total) by mouth 2 (two) times daily with a meal. 05/18/19  Yes Gatha Mayer, MD  nitroGLYCERIN (NITROSTAT) 0.4 MG SL tablet Place 1 tablet (0.4 mg total) under the tongue every 5 (five) minutes as needed for chest pain. 03/19/18 06/29/19  Martinique, Peter M, MD    Physical Exam:   Vitals:   05/19/19 1242 05/19/19 1633 05/19/19 1634 05/19/19 1730  BP: (!) 150/92 (!) 154/97  (!) 159/95  Pulse: 97  96 91  Resp: 15  17 16   Temp: 98.2 F (36.8 C)     TempSrc: Oral     SpO2: 98%  98% 97%     Physical Exam: Blood pressure (!) 159/95, pulse 91, temperature 98.2 F (36.8 C), temperature source Oral, resp. rate 16, SpO2 97 %. Gen: Thin man lying flat in bed in no acute distress with somewhat flat affect. Eyes: sclera anicteric, conjuctiva minimal injection bilaterally CVS: R7-E0 with 2/6 systolic murmur Respiratory:  normal effort, symmetrical excursion, CTA without adventitious sounds.  GI: Patient does have bowel sounds although diminished.  Abdomen is soft.  He has minimal tenderness to palpation in the hypogastric region.  No rebound tenderness.  No guarding. LE: No edema. No cyanosis Neuro: A/O x 3, Moving all extremities equally with normal strength, CN 3-12 intact, grossly nonfocal.  Psych: patient is logical and coherent, judgement and insight appear normal, mood and affect appropriate to situation. Skin: no rashes or lesions or ulcers,    Data Review:    Labs: Basic Metabolic Panel: Recent Labs  Lab 05/19/19 1325  NA 133*  K 4.4  CL 103  CO2 17*  GLUCOSE 127*  BUN 20  CREATININE 1.33*  CALCIUM 9.0   Liver Function Tests: Recent Labs  Lab 05/19/19 1325  AST 16  ALT 12  ALKPHOS 81  BILITOT 0.7  PROT 6.1*  ALBUMIN 2.8*   Recent Labs  Lab 05/19/19 1325  LIPASE 20   No results for  input(s): AMMONIA in the last 168 hours. CBC: Recent Labs  Lab 05/19/19 1325  WBC 15.9*  NEUTROABS 14.6*  HGB 12.3*  HCT 40.2  MCV 95.3  PLT 409*   Cardiac Enzymes: No results for input(s): CKTOTAL, CKMB, CKMBINDEX, TROPONINI in the last 168 hours.  BNP (last 3 results) No results for input(s): PROBNP in the last 8760 hours. CBG: Recent Labs  Lab 05/19/19 1333  GLUCAP 122*    Urinalysis    Component Value Date/Time   COLORURINE YELLOW 08/15/2018 1956   APPEARANCEUR CLEAR 08/15/2018 1956   LABSPEC 1.023 08/15/2018 1956   PHURINE 6.0 08/15/2018 1956   GLUCOSEU NEGATIVE 08/15/2018 1956   HGBUR NEGATIVE 08/15/2018 1956   BILIRUBINUR NEGATIVE 08/15/2018 1956   KETONESUR 5 (A) 08/15/2018 1956   PROTEINUR NEGATIVE 08/15/2018 1956   NITRITE NEGATIVE 08/15/2018 1956   LEUKOCYTESUR NEGATIVE 08/15/2018 1956      Radiographic Studies: DG Abd Acute W/Chest  Result Date: 05/19/2019 CLINICAL DATA:  Chronic 8 month history of midline abdominal pain associated with nausea and vomiting. Prior thoracic and abdominal aortic stent grafting. EXAM: DG ABDOMEN ACUTE W/ 1V CHEST COMPARISON:  CT angio chest abdomen and pelvis 05/07/2019. Chest x-ray 02/01/2018. FINDINGS: Mild gaseous distension of adjacent loops of jejunum in the LEFT UPPER QUADRANT demonstrating air-fluid levels on the ERECT image. No free intraperitoneal air. Expected stool burden and residual contrast material throughout the normal caliber and decompressed colon, with numerous diverticula involving the sigmoid colon; it is unclear where the contrast material originated, as it was not given for the recent CTA and there are no interval imaging studies since then. Thoracic and abdominal aortic stent grafting with stenting of the renal arteries. Cardiac silhouette mildly enlarged, unchanged. The opacity in the superior segment RIGHT LOWER LOBE identified on the recent CTA has improved is visible though much less conspicuous on the  chest x-ray. Linear scarring is present in the RIGHT LOWER LOBE in a vertical orientation, unchanged. New linear atelectasis at the LEFT lung base. IMPRESSION: 1. Partial small bowel obstruction involving the jejunum in the LEFT UPPER QUADRANT. No free intraperitoneal air. 2. Diverticulosis involving the sigmoid colon. 3. The superior segment RIGHT LOWER LOBE opacity identified on the recent CTA is still present though much less conspicuous on chest x-ray. The recommendation for a follow-up CT in 3 months, early May, 2021, remains. 4. New atelectasis involving the LEFT lung base. No acute cardiopulmonary disease otherwise. Electronically Signed   By: Evangeline Dakin M.D.   On: 05/19/2019 13:37   CT Angio Chest/Abd/Pel for Dissection W and/or W/WO  Result Date: 05/19/2019 CLINICAL DATA:  History of thoracoabdominal aortic aneurysm status post stent graft repair complicated by endoleak requiring additional stent graft placement 05/07/2019. Patient presents with abdominal pain. EXAM: CT ANGIOGRAPHY CHEST, ABDOMEN AND PELVIS TECHNIQUE: Multidetector CT imaging through the chest, abdomen and pelvis was performed using the standard protocol during bolus administration of intravenous contrast. Multiplanar reconstructed images and MIPs were obtained and reviewed to evaluate the vascular anatomy. CONTRAST:  159mL OMNIPAQUE IOHEXOL 350 MG/ML SOLN COMPARISON:  05/07/2019 CT angiogram of the chest, abdomen and pelvis. FINDINGS: CTA CHEST FINDINGS Cardiovascular: Normal heart size. No significant pericardial effusion/thickening. Left anterior descending and right coronary atherosclerosis. Atherosclerotic thoracic aorta. Long segment thoracoabdominal aortic aneurysm extending from the mid descending thoracic aorta into the infrarenal abdominal aorta status post stent graft repair, with interval placement of stent grafts in entire descending thoracic aorta with previous stent graft in the abdominal aorta. Descending thoracic  aorta is tortuous. The lower descending thoracic aortic aneurysm sac measures 8.1 cm maximum diameter (series 11/image 87), compared to 8.3 cm  on 05/07/2019, slightly decreased. Tiny focus of nonspecific gas within descending thoracic aortic aneurysm sac. No acute intramural hematoma or acute dissection in the thoracic aorta. Aortic arch branch vessels are patent. No appreciable endoleak in the thoracic aorta. Normal caliber main pulmonary artery. No central pulmonary emboli. Mediastinum/Nodes: No discrete thyroid nodules. Unremarkable esophagus. No pathologically enlarged axillary, mediastinal or hilar lymph nodes. Lungs/Pleura: No pneumothorax. Small dependent bilateral pleural effusions. Mild centrilobular and paraseptal emphysema. Stable 4 mm basilar right upper lobe solid pulmonary nodule along the minor fissure (series 5/image 34). Sub solid 4.6 x 2.5 cm masslike opacity in the superior segment right lower lobe (series 5/image 29) with 2.4 cm solid component, unchanged since 05/07/2019 chest CT using similar measurement technique. Passive atelectasis in the left greater than right lower lobes. No new significant pulmonary nodules. Musculoskeletal: No aggressive appearing focal osseous lesions. Mild thoracic spondylosis. Review of the MIP images confirms the above findings. CTA ABDOMEN AND PELVIS FINDINGS VASCULAR Aorta: Prior abdominal aortic stent graft repair long segment suprarenal and infrarenal abdominal aortic aneurysm, with maximum abdominal aortic aneurysm sac diameter measuring 6.7 cm in the suprarenal abdominal aorta (series 11/image 81), unchanged. No evidence of an endoleak in abdominal aorta. Prior aorto bi-iliac surgical graft repair, which appears patent. Celiac: Stable stent within the celiac trunk, which appears patent. SMA: Stable proximal SMA stent, which demonstrates approximately 50% luminal narrowing at the origin (series 14/image 119), slightly worsened since 05/07/2019. SMA patent  distal to the SMA stent with no thrombosis. Renals: Stable appearance of the bilateral proximal renal artery stents, which appear patent without high-grade stenosis. IMA: Chronic occlusion. Inflow: No high-grade iliac graft or common femoral artery stenoses. 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 Veins: No obvious venous abnormality within the limitations of this arterial phase study. Review of the MIP images confirms the above findings. NON-VASCULAR Hepatobiliary: Normal liver size. Stable subcentimeter hypodense anterior liver lesion, too small to characterize. No new liver lesions. Cholecystectomy. No biliary ductal dilatation. Pancreas: Normal, with no mass or duct dilation. Spleen: Normal size. No mass. Adrenals/Urinary Tract: Normal adrenals. Stable scattered subcentimeter hypodense renal cortical lesions, too small to characterize, requiring no follow-up. No new renal lesions. No hydronephrosis. Normal bladder. Stomach/Bowel: Normal non-distended stomach. Several mildly dilated small bowel loops throughout the abdomen up to 3.1 cm diameter, with air-fluid levels. No definite small bowel wall thickening or pneumatosis. Distal small bowel is collapsed. No discrete small bowel caliber transition identified. Appendix not discretely visualized. Mild sigmoid diverticulosis, with no large bowel wall thickening or significant pericolonic fat stranding. Vascular/Lymphatic: No pathologically enlarged lymph nodes in the abdomen or pelvis. Reproductive: Mild prostatomegaly. Nonspecific internal prostatic calcifications. Other: No pneumoperitoneum. Small volume ascites, mildly increased from prior. No focal fluid collections. Musculoskeletal: No aggressive appearing focal osseous lesions. Moderate thoracic spondylosis. Review of the MIP images confirms the above findings. IMPRESSION: 1. Mildly dilated small bowel loops throughout the abdomen with collapsed distal small bowel. Findings are concerning for mild  mechanical distal small bowel obstruction, although no discrete caliber transition is identified. Differential includes adynamic ileus. No free air. 2. Subsolid 4.6 cm superior segment right lower lobe masslike opacity with 2.4 cm solid component, persistent and unchanged since recent CT. Primary bronchogenic carcinoma not excluded in this high risk patient with smoking related changes in the lungs. Outpatient PET-CT suggested for further evaluation when clinically feasible. 3. Long segment thoracoabdominal aortic aneurysm status post stent graft repair, with interval placement of stent grafts in the descending thoracic  aorta. No evidence of an endoleak. The lower descending thoracic aortic aneurysm sac measures 8.1 cm maximum diameter, slightly decreased. Tiny focus of nonspecific gas within the descending thoracic aortic aneurysm sac, presumably due to recent intervention. 4. Narrowing of the SMA stent at the origin, approximately 50%, appearing slightly worsened. SMA remains patent. 5. Small dependent bilateral pleural effusions. Passive atelectasis in the left greater than right lower lobes. 6. Aortic Atherosclerosis (ICD10-I70.0) and Emphysema (ICD10-J43.9). Electronically Signed   By: Ilona Sorrel M.D.   On: 05/19/2019 15:47     EKG: Independently reviewed.  Sinus rhythm at 100.  Normal intervals.  Normal axis.  Q wave in 3 and F.  Poor R wave progression with Q waves in V1 and V3.  No acute ST-T wave changes.   Assessment/Plan:   Principal Problem:   SBO (small bowel obstruction) (HCC) Active Problems:   Essential hypertension   Acute combined systolic and diastolic heart failure (H[facility])   Ischemic cardiomyopathy   History of abdominal aortic aneurysm (AAA) repair   Hemochromatosis  74 year old man presents with SBO 2 days after discharge from [facility] status post endovascular repair of AAA leak.  Postop course was complicated by ileus which was treated conservatively.  Patient now presents with  likely recurrent ileus.  SBO Patient seen by general surgery who recommended conservative management with an NG tube suction and bowel regimen. NG tube requested to be placed in the ED. Follow ins and outs closely Normal saline at 100 cc an hour, need to be careful with fluids given known ischemic cardiomyopathy.  RLL OPACITY Patient is known to have approximately five 5 cm mass/opacity in superior segment of right lower lobe. It is unclear to me whether patient has been informed about this or not although it was seen on imaging last month when he presented with his AAA leak. PET scan had been recommended as an outpatient. Patient may benefit from inpatient work-up while he is here.  CONSTIPATION Patient states he is not had a good bowel movement since November. However there does not appear to be a huge stool burden on imaging. Patient does admit to decreased p.o. intake over the past couple of months. Will place patient on Dulcolax suppository standing which can be advanced to enemas as warranted. Of note patient was prescribed Amitiza as an outpatient which he has not picked up, I am not starting this at present.  HYPONATREMIA Will treat with normal saline fluid resuscitation and recheck in the morning  S/P AAA REPAIR No evidence for recurrent leak today status post endovascular repair at [facility] last week.  HTN Continue losartan  CAD No chest pain or evidence of ACS. Continue aspirin and Plavix.  CHF No evidence for acute decompensation. Need to be careful with IV fluids. Patient does not appear to be on a beta-blocker as an outpatient. Continue losartan.     Other information:   DVT prophylaxis: Lovenox ordered. Code Status: Full Family Communication: Patient states he just spoke with his sister, no need for [place] to call Disposition Plan: Home Consults called: General surgery Admission status: Inpatient  [facility]  If 7PM-7AM,  please contact night-coverage [email or web address] Password [email or web address] 05/19/2019, 5:54 PM

## 2019-05-19 NOTE — ED Provider Notes (Signed)
[facility] EMERGENCY DEPARTMENT Provider Note   CSN: [id number] Arrival date & time: 05/19/19  1225     History Chief Complaint  Patient presents with  . Abdominal Pain    Patrick Adams is a 74 y.o. male with a past medical history of recent aortic repair, hemochromatosis, hypertension, hyperlipidemia, ischemic cardiomyopathy, CAD status post PCI, STEMI, nephrolithiasis, who presents today for evaluation of abdominal pain. He recently was seen on 2/7 at [facility] when he presented with upper abdominal pain. His AAA was originally repaired in 2008. On 02/23/2019 he underwent four-vessel FEVAR repair. He was originally seen 05/07/2019 for epigastric pain here.  He was found to have a proximal aneurysm to his graft, he was transferred to [facility], and underwent repair on 2/8 as TEVAR proximal cuff expasnion.  He then had 05/12/19 a endoleak repair.  He reports that last night his epigastric abdominal pain worsened. He states that it worsened after he ate half a [place].  He reports that this morning he has vomited 3 times.  He reports being constipated.  He saw GI yesterday for constipation.  While admitted at [facility] he had a partial bowel obstruction that was treated conservatively with a NG tube.      HPI     Past Medical History:  Diagnosis Date  . AAA (abdominal aortic aneurysm) ([place])   . CAD S/P PCI 01/2018   a) 01/31/2018: p-mLAD 60-100% (DES PCI with 2.75 mm x 38 mm Synergy DES - 4.0-3.6.3.0 mm tapered post-dilation); b) p-mRCA 55%-85%-40% - Synergy DES 3.5 mm x 32 mm -> 4.0 mm; & RPAV 90% --> Synergy DES 2.5 mm x 12 mm -> 3.0 mm;; c) stents patent on re-look Cath 03/19/2018  . Diverticulitis   . Erectile dysfunction   . Hemochromatosis   . History of abdominal aortic aneurysm (AAA) repair 2008   With thoracic aortic aneurysm being monitored  . Hyperlipidemia   . Hypertension   . Ischemic cardiomyopathy 01/31/2018   Initial post STEMI EF by echo 40 to 45%  --> by cath 1 month later LV gram showed EF 55 to 60%.  . MDD (major depressive disorder)   . Nephrolithiasis   . PAD (peripheral artery disease) ([place])   . Prostate nodule   . ST elevation (STEMI) myocardial infarction involving left anterior descending coronary artery ([place]) 01/31/2018   Found to have pLAD 60%-mLAD 100% (DES PCI), distal LAD diffuse 60-70% (med Rx), OM1 65%; also signifiicant p-m RCA & RPAV disease  --> Initial EF ~45% with elevated LVEDP  . 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 Tobacco abuse   . Coronary artery disease involving native coronary artery of native heart with angina pectoris ([place]) 01/31/2018  . Essential hypertension 01/31/2018  . Hyperlipidemia with target LDL less than 70 01/31/2018  . Acute ST elevation myocardial infarction (STEMI) involving left anterior descending (LAD) coronary artery ([place]) 01/31/2018  . Acute combined systolic and diastolic heart failure ([place]) 01/31/2018  . Ischemic cardiomyopathy 01/31/2018  . History of abdominal aortic aneurysm (AAA) repair 2008  . Thoracic aortic aneurysm [facility]) - descending (residual after AAA repair) 2008    Past Surgical History:  Procedure Laterality Date  . ABDOMINAL AORTIC ANEURYSM REPAIR  2008  . ABDOMINAL AORTIC ANEURYSM REPAIR    . CORONARY STENT INTERVENTION N/A 02/03/2018   Procedure: CORONARY STENT INTERVENTION;  Surgeon: Belva Crome,  MD;  Location: [facility] INVASIVE CV LAB;;   staged PCI p-mRCA (Synergy DES 3.5 mm x 32 mm--4.0 mm covering entire diseased segment from  proximal 55 through the 85 and 40% lesions in the mid vessel), RPAV (Synergy DES 2.5 mm x 12 mm--3.0 mm)  . CORONARY/GRAFT ACUTE MI REVASCULARIZATION N/A 01/31/2018   Procedure: Coronary/Graft Acute MI Revascularization;  Surgeon: Leonie Man, MD;  Location: [place] CV LAB;; ANTERIOR-INFERIOR STEMI: pLAD 60% - mLAD 100% (PCI-Synergy DES  2.75 mm at 38 mm--4.1-3.6-3.0 mm tapered post dilation)  . LEFT HEART CATH AND CORONARY ANGIOGRAPHY N/A 01/31/2018   Procedure: LEFT HEART CATH AND CORONARY ANGIOGRAPHY;  Surgeon: Leonie Man, MD;  Location: [facility] INVASIVE CV LAB;;; (anterior inferior STEMI) -> pLAD 60% - mLAD 100% (DES PCI); p-m RCA 55%, mRCA 85%, mRCA 40% (diffuse segmental disease), RPAV 90% (ulcerated), OM1 65%.  EF 35-45%, moderately elevated LVEDP --acute combined systolic and diastolic heart failure, ischemic cardiomyopathy  . LEFT HEART CATH AND CORONARY ANGIOGRAPHY N/A 02/03/2018   Procedure: LEFT HEART CATH AND CORONARY ANGIOGRAPHY;  Surgeon: Belva Crome, MD;  Location: [place] CV LAB;  Service: Cardiovascular;  Laterality: N/A;  . LEFT HEART CATH AND CORONARY ANGIOGRAPHY N/A 03/19/2018   Procedure: LEFT HEART CATH AND CORONARY ANGIOGRAPHY;  Surgeon: Martinique, Peter M, MD;  Location: [place] CV LAB;  Service: Cardiovascular;  Patent stent in p-mLAD, patent p-m RCA stent and RPAV stent.  Distal LAD 70%, OM1 65%.  EF 55-60%.  . TRANSTHORACIC ECHOCARDIOGRAM  02/01/2018   (Insetting of anterior-inferior STEMI) --> moderate LVH.  EF 40 to 45%.  Anterior, anteroseptal and apical severe hypokinesis to akinesis.  Indeterminate LV filling pressures.  Aortic sclerosis, no stenosis.       Family History  Problem Relation Age of Onset  . Colonic polyp Mother   . Heart disease Father   . Liver disease Father     Social History   Tobacco Use  . Smoking status: Current Every Day Smoker    Packs/day: 0.50    Types: Cigarettes  . Smokeless tobacco: Never Used  Substance Use Topics  . Alcohol use: No  . Drug use: No    Home Medications Prior to Admission medications   Medication Sig Start Date End Date Taking? Authorizing Provider  acetaminophen (TYLENOL) 500 MG tablet Take 1,000 mg by mouth daily as needed for headache (pain).    Yes [provider]  aspirin EC 81 MG tablet Take 81 mg by mouth at  bedtime.   Yes [provider]  cholecalciferol (VITAMIN D3) 25 MCG (1000 UT) tablet Take 1,000 Units by mouth daily.   Yes [provider]  clopidogrel (PLAVIX) 75 MG tablet Take 1 tablet (75 mg total) by mouth daily. Patient taking differently: Take 75 mg by mouth at bedtime.  03/02/19  Yes Leonie Man, MD  losartan (COZAAR) 25 MG tablet TAKE 1 TABLET BY MOUTH EVERY DAY Patient taking differently: Take 25 mg by mouth at bedtime.  10/28/18  Yes [place], Peter M, MD  lubiprostone (AMITIZA) 24 MCG capsule Take 1 capsule (24 mcg total) by mouth 2 (two) times daily with a meal. 05/18/19  Yes Gatha Mayer, MD  nitroGLYCERIN (NITROSTAT) 0.4 MG SL tablet Place 1 tablet (0.4 mg total) under the tongue every 5 (five) minutes as needed for chest pain. 03/19/18 06/29/19  [place], Peter M, MD    Allergies    Brilinta [ticagrelor], Ciprofloxacin, and Statins  Review of Systems  Review of Systems  Constitutional: Negative for chills and fever.  HENT: Negative for congestion.   Eyes: Negative for visual disturbance.  Respiratory: Negative for cough, chest tightness and shortness of breath.   Cardiovascular: Negative for chest pain.  Gastrointestinal: Positive for abdominal pain, constipation, nausea and vomiting. Negative for diarrhea.  Genitourinary: Negative for dysuria.  Musculoskeletal: Negative for back pain and neck pain.  Skin: Negative for color change, rash and wound.  Neurological: Negative for weakness and headaches.  All other systems reviewed and are negative.   Physical Exam Updated Vital Signs BP (!) 154/97   Pulse 96   Temp 98.2 F (36.8 C) (Oral)   Resp 17   SpO2 98%   Physical Exam Vitals and nursing note reviewed.  Constitutional:      General: He is not in acute distress.    Appearance: He is well-developed. He is not diaphoretic.     Comments: Chronically ill appearing.   HENT:     Head: Normocephalic and atraumatic.  Eyes:     General: No  scleral icterus.       Right eye: No discharge.        Left eye: No discharge.     Conjunctiva/sclera: Conjunctivae normal.  Cardiovascular:     Rate and Rhythm: Normal rate and regular rhythm.     Pulses: Normal pulses.     Heart sounds: Normal heart sounds.     Comments: 2+ PT and radial pulses bilaterally. Pulmonary:     Effort: Pulmonary effort is normal. No respiratory distress.     Breath sounds: No stridor.  Abdominal:     General: Bowel sounds are normal. There is no distension.     Palpations: Abdomen is soft.     Tenderness: There is generalized abdominal tenderness (Worse in RLQ).     Hernia: No hernia is present.     Comments: Midline surgical scar.  There is a mild swelling supraumbilical along the scar.  No hernia palpable.   Musculoskeletal:        General: No deformity.     Cervical back: Normal range of motion.  Skin:    General: Skin is warm and dry.     Capillary Refill: Capillary refill takes less than 2 seconds.  Neurological:     General: No focal deficit present.     Mental Status: He is alert.     Cranial Nerves: No cranial nerve deficit.     Motor: No abnormal muscle tone.  Psychiatric:        Mood and Affect: Mood normal.        Behavior: Behavior normal.     ED Results / Procedures / Treatments   Labs (all labs ordered are listed, but only abnormal results are displayed) Labs Reviewed  COMPREHENSIVE METABOLIC PANEL - Abnormal; Notable for the following components:      Result Value   Sodium 133 (*)    CO2 17 (*)    Glucose, Bld 127 (*)    Creatinine, Ser 1.33 (*)    Total Protein 6.1 (*)    Albumin 2.8 (*)    GFR calc non Af Amer 53 (*)    All other components within normal limits  CBC WITH DIFFERENTIAL/PLATELET - Abnormal; Notable for the following components:   WBC 15.9 (*)    Hemoglobin 12.3 (*)    RDW 15.6 (*)    Platelets 409 (*)    Neutro Abs 14.6 (*)    Lymphs Abs 0.6 (*)  Abs Immature Granulocytes 0.08 (*)    All other  components within normal limits  CBG MONITORING, ED - Abnormal; Notable for the following components:   Glucose-Capillary 122 (*)    All other components within normal limits  CULTURE, BLOOD (ROUTINE X 2)  CULTURE, BLOOD (ROUTINE X 2)  SARS CORONAVIRUS 2 (TAT 6-24 HRS)  LIPASE, BLOOD  LACTIC ACID, PLASMA  LACTIC ACID, PLASMA  I-STAT CREATININE, ED    EKG EKG Interpretation  Date/Time:  Thursday May 19 2019 12:40:03 EST Ventricular Rate:  99 PR Interval:    QRS Duration: 83 QT Interval:  350 QTC Calculation: 450 R Axis:   37 Text Interpretation: Sinus rhythm Probable left atrial enlargement Borderline T abnormalities, anterior leads No significant change since last tracing Confirmed by Blanchie Dessert [phone]) on 05/19/2019 12:43:41 PM   Radiology DG Abd Acute W/Chest  Result Date: 05/19/2019 CLINICAL DATA:  Chronic 8 month history of midline abdominal pain associated with nausea and vomiting. Prior thoracic and abdominal aortic stent grafting. EXAM: DG ABDOMEN ACUTE W/ 1V CHEST COMPARISON:  CT angio chest abdomen and pelvis 05/07/2019. Chest x-ray 02/01/2018. FINDINGS: Mild gaseous distension of adjacent loops of jejunum in the LEFT UPPER QUADRANT demonstrating air-fluid levels on the ERECT image. No free intraperitoneal air. Expected stool burden and residual contrast material throughout the normal caliber and decompressed colon, with numerous diverticula involving the sigmoid colon; it is unclear where the contrast material originated, as it was not given for the recent CTA and there are no interval imaging studies since then. Thoracic and abdominal aortic stent grafting with stenting of the renal arteries. Cardiac silhouette mildly enlarged, unchanged. The opacity in the superior segment RIGHT LOWER LOBE identified on the recent CTA has improved is visible though much less conspicuous on the chest x-ray. Linear scarring is present in the RIGHT LOWER LOBE in a vertical  orientation, unchanged. New linear atelectasis at the LEFT lung base. IMPRESSION: 1. Partial small bowel obstruction involving the jejunum in the LEFT UPPER QUADRANT. No free intraperitoneal air. 2. Diverticulosis involving the sigmoid colon. 3. The superior segment RIGHT LOWER LOBE opacity identified on the recent CTA is still present though much less conspicuous on chest x-ray. The recommendation for a follow-up CT in 3 months, early May, 2021, remains. 4. New atelectasis involving the LEFT lung base. No acute cardiopulmonary disease otherwise. Electronically Signed   By: Evangeline Dakin M.D.   On: 05/19/2019 13:37   CT Angio Chest/Abd/Pel for Dissection W and/or W/WO  Result Date: 05/19/2019 CLINICAL DATA:  History of thoracoabdominal aortic aneurysm status post stent graft repair complicated by endoleak requiring additional stent graft placement 05/07/2019. Patient presents with abdominal pain. EXAM: CT ANGIOGRAPHY CHEST, ABDOMEN AND PELVIS TECHNIQUE: Multidetector CT imaging through the chest, abdomen and pelvis was performed using the standard protocol during bolus administration of intravenous contrast. Multiplanar reconstructed images and MIPs were obtained and reviewed to evaluate the vascular anatomy. CONTRAST:  186mL OMNIPAQUE IOHEXOL 350 MG/ML SOLN COMPARISON:  05/07/2019 CT angiogram of the chest, abdomen and pelvis. FINDINGS: CTA CHEST FINDINGS Cardiovascular: Normal heart size. No significant pericardial effusion/thickening. Left anterior descending and right coronary atherosclerosis. Atherosclerotic thoracic aorta. Long segment thoracoabdominal aortic aneurysm extending from the mid descending thoracic aorta into the infrarenal abdominal aorta status post stent graft repair, with interval placement of stent grafts in entire descending thoracic aorta with previous stent graft in the abdominal aorta. Descending thoracic aorta is tortuous. The lower descending thoracic aortic aneurysm sac measures  8.1  cm maximum diameter (series 11/image 87), compared to 8.3 cm on 05/07/2019, slightly decreased. Tiny focus of nonspecific gas within descending thoracic aortic aneurysm sac. No acute intramural hematoma or acute dissection in the thoracic aorta. Aortic arch branch vessels are patent. No appreciable endoleak in the thoracic aorta. Normal caliber main pulmonary artery. No central pulmonary emboli. Mediastinum/Nodes: No discrete thyroid nodules. Unremarkable esophagus. No pathologically enlarged axillary, mediastinal or hilar lymph nodes. Lungs/Pleura: No pneumothorax. Small dependent bilateral pleural effusions. Mild centrilobular and paraseptal emphysema. Stable 4 mm basilar right upper lobe solid pulmonary nodule along the minor fissure (series 5/image 34). Sub solid 4.6 x 2.5 cm masslike opacity in the superior segment right lower lobe (series 5/image 29) with 2.4 cm solid component, unchanged since 05/07/2019 chest CT using similar measurement technique. Passive atelectasis in the left greater than right lower lobes. No new significant pulmonary nodules. Musculoskeletal: No aggressive appearing focal osseous lesions. Mild thoracic spondylosis. Review of the MIP images confirms the above findings. CTA ABDOMEN AND PELVIS FINDINGS VASCULAR Aorta: Prior abdominal aortic stent graft repair long segment suprarenal and infrarenal abdominal aortic aneurysm, with maximum abdominal aortic aneurysm sac diameter measuring 6.7 cm in the suprarenal abdominal aorta (series 11/image 81), unchanged. No evidence of an endoleak in abdominal aorta. Prior aorto bi-iliac surgical graft repair, which appears patent. Celiac: Stable stent within the celiac trunk, which appears patent. SMA: Stable proximal SMA stent, which demonstrates approximately 50% luminal narrowing at the origin (series 14/image 119), slightly worsened since 05/07/2019. SMA patent distal to the SMA stent with no thrombosis. Renals: Stable appearance of the  bilateral proximal renal artery stents, which appear patent without high-grade stenosis. IMA: Chronic occlusion. Inflow: No high-grade iliac graft or common femoral artery stenoses. 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 Veins: No obvious venous abnormality within the limitations of this arterial phase study. Review of the MIP images confirms the above findings. NON-VASCULAR Hepatobiliary: Normal liver size. Stable subcentimeter hypodense anterior liver lesion, too small to characterize. No new liver lesions. Cholecystectomy. No biliary ductal dilatation. Pancreas: Normal, with no mass or duct dilation. Spleen: Normal size. No mass. Adrenals/Urinary Tract: Normal adrenals. Stable scattered subcentimeter hypodense renal cortical lesions, too small to characterize, requiring no follow-up. No new renal lesions. No hydronephrosis. Normal bladder. Stomach/Bowel: Normal non-distended stomach. Several mildly dilated small bowel loops throughout the abdomen up to 3.1 cm diameter, with air-fluid levels. No definite small bowel wall thickening or pneumatosis. Distal small bowel is collapsed. No discrete small bowel caliber transition identified. Appendix not discretely visualized. Mild sigmoid diverticulosis, with no large bowel wall thickening or significant pericolonic fat stranding. Vascular/Lymphatic: No pathologically enlarged lymph nodes in the abdomen or pelvis. Reproductive: Mild prostatomegaly. Nonspecific internal prostatic calcifications. Other: No pneumoperitoneum. Small volume ascites, mildly increased from prior. No focal fluid collections. Musculoskeletal: No aggressive appearing focal osseous lesions. Moderate thoracic spondylosis. Review of the MIP images confirms the above findings. IMPRESSION: 1. Mildly dilated small bowel loops throughout the abdomen with collapsed distal small bowel. Findings are concerning for mild mechanical distal small bowel obstruction, although no discrete caliber  transition is identified. Differential includes adynamic ileus. No free air. 2. Subsolid 4.6 cm superior segment right lower lobe masslike opacity with 2.4 cm solid component, persistent and unchanged since recent CT. Primary bronchogenic carcinoma not excluded in this high risk patient with smoking related changes in the lungs. Outpatient PET-CT suggested for further evaluation when clinically feasible. 3. Long segment thoracoabdominal aortic aneurysm status post stent graft repair,  with interval placement of stent grafts in the descending thoracic aorta. No evidence of an endoleak. The lower descending thoracic aortic aneurysm sac measures 8.1 cm maximum diameter, slightly decreased. Tiny focus of nonspecific gas within the descending thoracic aortic aneurysm sac, presumably due to recent intervention. 4. Narrowing of the SMA stent at the origin, approximately 50%, appearing slightly worsened. SMA remains patent. 5. Small dependent bilateral pleural effusions. Passive atelectasis in the left greater than right lower lobes. 6. Aortic Atherosclerosis (ICD10-I70.0) and Emphysema (ICD10-J43.9). Electronically Signed   By: Ilona Sorrel M.D.   On: 05/19/2019 15:47    Procedures Procedures (including critical care time)  Medications Ordered in ED Medications  0.9 %  sodium chloride infusion (has no administration in time range)  diatrizoate meglumine-sodium (GASTROGRAFIN) 66-10 % solution 90 mL (has no administration in time range)  fentaNYL (SUBLIMAZE) injection 50 mcg (50 mcg Intravenous Given 05/19/19 1357)  ondansetron (ZOFRAN) injection 4 mg (4 mg Intravenous Given 05/19/19 1436)  iohexol (OMNIPAQUE) 350 MG/ML injection 100 mL (100 mLs Intravenous Contrast Given 05/19/19 1453)    ED Course  I have reviewed the triage vital signs and the nursing notes.  Pertinent labs & imaging results that were available during my care of the patient were reviewed by me and considered in my medical decision making  (see chart for details).  Clinical Course as of May 19 1711  Thu May 19, 2019  1401 I spoke with Dr. Jamse Arn who requested a formal general surgery consult.   [EH]  1640 I spoke with Dr. Rosendo Gros of general surgery who will see patient in consult.    [EH]    Clinical Course User Index [EH] Ollen Gross   MDM Rules/Calculators/A&P                     Patient presents today for evaluation of epigastric abdominal pain.  His pain worsened over the past 24 hours after he attempted to eat solid food.  He had recently been discharged from the emergency after he was admitted for a aortic dissection. On exam he has mild abdominal tenderness to palpation. Labs are obtained and reviewed, his white count is slightly elevated which may be stress related given his multiple episodes of vomiting.  He is afebrile, not consistently tachycardic or tachypneic.    CMP and CBC both otherwise appear consistent with his baseline. He was treated with Zofran and fentanyl while in the emergency room. CT scan chest abdomen pelvis for dissection was ordered given his recent procedures without evidence of recurrent dissection, does show concern for small bowel obstruction along with concern for lung cancer which it is unclear patient is aware of that at this time however it was seen on previous scans.  I spoke with hospitalist who requested a formal general surgical consult and is also aware that patient may not be aware of the lung cancer diagnosis. Dr. Rosendo Gros of general surgery saw the patient in consult and recommended medical admission.  NG tube was ordered after discussion with Dr. Rosendo Gros.  This patient was seen as a shared visit with Dr. Maryan Rued.  Patient remained hemodynamically stable on my care.  Note: Portions of this report may have been transcribed using voice recognition software. Every effort was made to ensure accuracy; however, inadvertent computerized transcription errors may be  present  Final Clinical Impression(s) / ED Diagnoses Final diagnoses:  Encounter for imaging study to confirm nasogastric (NG) tube placement  Small bowel obstruction ([place])  Rx / DC Orders ED Discharge Orders    None       Ollen Gross 05/19/19 2108    Blanchie Dessert, MD 05/23/19 [phone]

## 2019-05-19 NOTE — ED Notes (Signed)
Per Dr Jimmye Norman NG tube advanced 4 cm.

## 2019-05-19 NOTE — ED Notes (Signed)
CBG Results of 122 reported to [place], Therapist, sports.

## 2019-05-19 NOTE — ED Notes (Signed)
Report attempted 

## 2019-05-19 NOTE — ED Notes (Signed)
Call daughter Loma Sousa Apply at [phone]

## 2019-05-20 ENCOUNTER — Encounter: Payer: Self-pay | Admitting: Internal Medicine

## 2019-05-20 ENCOUNTER — Inpatient Hospital Stay (HOSPITAL_COMMUNITY): Payer: Medicare HMO

## 2019-05-20 DIAGNOSIS — K5901 Slow transit constipation: Secondary | ICD-10-CM

## 2019-05-20 LAB — COMPREHENSIVE METABOLIC PANEL
ALT: 11 U/L (ref 0–44)
AST: 14 U/L — ABNORMAL LOW (ref 15–41)
Albumin: 2.6 g/dL — ABNORMAL LOW (ref 3.5–5.0)
Alkaline Phosphatase: 82 U/L (ref 38–126)
Anion gap: 15 (ref 5–15)
BUN: 19 mg/dL (ref 8–23)
CO2: 21 mmol/L — ABNORMAL LOW (ref 22–32)
Calcium: 8.9 mg/dL (ref 8.9–10.3)
Chloride: 99 mmol/L (ref 98–111)
Creatinine, Ser: 1.3 mg/dL — ABNORMAL HIGH (ref 0.61–1.24)
GFR calc Af Amer: >60 mL/min (ref >60)
GFR calc non Af Amer: 55 mL/min — ABNORMAL LOW (ref >60)
Glucose, Bld: 90 mg/dL (ref 70–99)
Potassium: 4.1 mmol/L (ref 3.5–5.1)
Sodium: 135 mmol/L (ref 135–145)
Total Bilirubin: 1 mg/dL (ref 0.3–1.2)
Total Protein: 5.7 g/dL — ABNORMAL LOW (ref 6.5–8.1)

## 2019-05-20 LAB — CBC
HCT: 34.3 % — ABNORMAL LOW (ref 39.0–52.0)
Hemoglobin: 11.4 g/dL — ABNORMAL LOW (ref 13.0–17.0)
MCH: 29 pg (ref 26.0–34.0)
MCHC: 33.2 g/dL (ref 30.0–36.0)
MCV: 87.3 fL (ref 80.0–100.0)
Platelets: 458 10*3/uL — ABNORMAL HIGH (ref 150–400)
RBC: 3.93 MIL/uL — ABNORMAL LOW (ref 4.22–5.81)
RDW: 15.6 % — ABNORMAL HIGH (ref 11.5–15.5)
WBC: 9.8 10*3/uL (ref 4.0–10.5)
nRBC: 0 % (ref 0.0–0.2)

## 2019-05-20 MED ORDER — LUBIPROSTONE 24 MCG PO CAPS
24.0000 ug | ORAL_CAPSULE | Freq: Two times a day (BID) | ORAL | Status: DC
  Administered 2019-05-21 – 2019-05-24 (×7): 24 ug via ORAL
  Filled 2019-05-20 (×9): qty 1

## 2019-05-20 MED ORDER — SORBITOL 70 % SOLN
960.0000 mL | TOPICAL_OIL | Freq: Once | ORAL | Status: AC
  Administered 2019-05-20: 960 mL via RECTAL
  Filled 2019-05-20: qty 473

## 2019-05-20 MED ORDER — FLEET ENEMA 7-19 GM/118ML RE ENEM
1.0000 | ENEMA | Freq: Two times a day (BID) | RECTAL | Status: DC
  Administered 2019-05-20: 1 via RECTAL
  Filled 2019-05-20: qty 1

## 2019-05-20 MED ORDER — LACTATED RINGERS IV SOLN: INTRAVENOUS | Status: DC

## 2019-05-20 NOTE — Plan of Care (Signed)
  Problem: Education: Goal: Knowledge of General Education information will improve Description Including pain rating scale, medication(s)/side effects and non-pharmacologic comfort measures Outcome: Progressing   

## 2019-05-20 NOTE — Progress Notes (Signed)
Subjective/Chief Complaint: Patient feels somewhat better today.,  No bowel movements.   Objective: Vital signs in last 24 hours: Temp:  [98 F (36.7 C)-98.8 F (37.1 C)] 98.7 F (37.1 C) (02/19 0621) Pulse Rate:  [86-138] 90 (02/19 0621) Resp:  [15-20] 18 (02/19 0314) BP: (126-167)/(69-109) 157/93 (02/19 0621) SpO2:  [95 %-98 %] 95 % (02/19 0621) Last BM Date: (Pt states it's been "months")  Intake/Output from previous day: 02/18 0701 - 02/19 0700 In: -  Out: 950 [Urine:300; Emesis/NG output:650] Intake/Output this shift: Total I/O In: 928 [I.V.:928] Out: -   Constitutional: No acute distress, conversant, appears states age. Eyes: Anicteric sclerae, moist conjunctiva, no lid lag Lungs: Clear to auscultation bilaterally, normal respiratory effort CV: regular rate and rhythm, no murmurs, no peripheral edema, pedal pulses 2+ GI: Soft, no masses or hepatosplenomegaly,  Min tender to palpation in epigastrium, distended, no rebound, no guarding Skin: No rashes, palpation reveals normal turgor Psychiatric: appropriate judgment and insight, oriented to person, place, and time   Lab Results:  Recent Labs    05/19/19 1325 05/20/19 0220  WBC 15.9* 9.8  HGB 12.3* 11.4*  HCT 40.2 34.3*  PLT 409* 458*   BMET Recent Labs    05/19/19 1325 05/20/19 0220  NA 133* 135  K 4.4 4.1  CL 103 99  CO2 17* 21*  GLUCOSE 127* 90  BUN 20 19  CREATININE 1.33* 1.30*  CALCIUM 9.0 8.9   PT/INR No results for input(s): LABPROT, INR in the last 72 hours. ABG No results for input(s): PHART, HCO3 in the last 72 hours.  Invalid input(s): PCO2, PO2  Studies/Results: DG Abd Acute W/Chest  Result Date: 05/19/2019 CLINICAL DATA:  Chronic 8 month history of midline abdominal pain associated with nausea and vomiting. Prior thoracic and abdominal aortic stent grafting. EXAM: DG ABDOMEN ACUTE W/ 1V CHEST COMPARISON:  CT angio chest abdomen and pelvis 05/07/2019. Chest x-ray 02/01/2018.  FINDINGS: Mild gaseous distension of adjacent loops of jejunum in the LEFT UPPER QUADRANT demonstrating air-fluid levels on the ERECT image. No free intraperitoneal air. Expected stool burden and residual contrast material throughout the normal caliber and decompressed colon, with numerous diverticula involving the sigmoid colon; it is unclear where the contrast material originated, as it was not given for the recent CTA and there are no interval imaging studies since then. Thoracic and abdominal aortic stent grafting with stenting of the renal arteries. Cardiac silhouette mildly enlarged, unchanged. The opacity in the superior segment RIGHT LOWER LOBE identified on the recent CTA has improved is visible though much less conspicuous on the chest x-ray. Linear scarring is present in the RIGHT LOWER LOBE in a vertical orientation, unchanged. New linear atelectasis at the LEFT lung base. IMPRESSION: 1. Partial small bowel obstruction involving the jejunum in the LEFT UPPER QUADRANT. No free intraperitoneal air. 2. Diverticulosis involving the sigmoid colon. 3. The superior segment RIGHT LOWER LOBE opacity identified on the recent CTA is still present though much less conspicuous on chest x-ray. The recommendation for a follow-up CT in 3 months, early May, 2021, remains. 4. New atelectasis involving the LEFT lung base. No acute cardiopulmonary disease otherwise. Electronically Signed   By: Evangeline Dakin M.D.   On: 05/19/2019 13:37   DG Abd Portable 1V-Small Bowel Protocol-Position Verification  Result Date: 05/19/2019 CLINICAL DATA:  NG tube placement EXAM: PORTABLE ABDOMEN - 1 VIEW COMPARISON:  None. FINDINGS: The distal tip of the NG tube is in the left upper quadrant, likely within  the gastric fundus. The side port appears to be just above the GE junction. Contrast in the bladder and colon. Vascular stents identified. No evidence of bowel obstruction. IMPRESSION: 1. The distal tip of the NG tube is in the  stomach with the side port is just above the GE junction. Recommend advancing 4-6 cm. 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 Patient presents with abdominal pain. EXAM: CT ANGIOGRAPHY CHEST, ABDOMEN AND PELVIS TECHNIQUE: Multidetector CT imaging through the chest, abdomen and pelvis was performed using the standard protocol during bolus administration of intravenous contrast. Multiplanar reconstructed images and MIPs were obtained and reviewed to evaluate the vascular anatomy. CONTRAST:  162mL OMNIPAQUE IOHEXOL 350 MG/ML SOLN COMPARISON:  05/07/2019 CT angiogram of the chest, abdomen and pelvis. FINDINGS: CTA CHEST FINDINGS Cardiovascular: Normal heart size. No significant pericardial effusion/thickening. Left anterior descending and right coronary atherosclerosis. Atherosclerotic thoracic aorta. Long segment thoracoabdominal aortic aneurysm extending from the mid descending thoracic aorta into the infrarenal abdominal aorta status post stent graft repair, with interval placement of stent grafts in entire descending thoracic aorta with previous stent graft in the abdominal aorta. Descending thoracic aorta is tortuous. The lower descending thoracic aortic aneurysm sac measures 8.1 cm maximum diameter (series 11/image 87), compared to 8.3 cm on 05/07/2019, slightly decreased. Tiny focus of nonspecific gas within descending thoracic aortic aneurysm sac. No acute intramural hematoma or acute dissection in the thoracic aorta. Aortic arch branch vessels are patent. No appreciable endoleak in the thoracic aorta. Normal caliber main pulmonary artery. No central pulmonary emboli. Mediastinum/Nodes: No discrete thyroid nodules. Unremarkable  esophagus. No pathologically enlarged axillary, mediastinal or hilar lymph nodes. Lungs/Pleura: No pneumothorax. Small dependent bilateral pleural effusions. Mild centrilobular and paraseptal emphysema. Stable 4 mm basilar right upper lobe solid pulmonary nodule along the minor fissure (series 5/image 34). Sub solid 4.6 x 2.5 cm masslike opacity in the superior segment right lower lobe (series 5/image 29) with 2.4 cm solid component, unchanged since 05/07/2019 chest CT using similar measurement technique. Passive atelectasis in the left greater than right lower lobes. No new significant pulmonary nodules. Musculoskeletal: No aggressive appearing focal osseous lesions. Mild thoracic spondylosis. Review of the MIP images confirms the above findings. CTA ABDOMEN AND PELVIS FINDINGS VASCULAR Aorta: Prior abdominal aortic stent graft repair long segment suprarenal and infrarenal abdominal aortic aneurysm, with maximum abdominal aortic aneurysm sac diameter measuring 6.7 cm in the suprarenal abdominal aorta (series 11/image 81), unchanged. No evidence of an endoleak in abdominal aorta. Prior aorto bi-iliac surgical graft repair, which appears patent. Celiac: Stable stent within the celiac trunk, which appears patent. SMA: Stable proximal SMA stent, which demonstrates approximately 50% luminal narrowing at the origin (series 14/image 119), slightly worsened since 05/07/2019. SMA patent distal to the SMA stent with no thrombosis. Renals: Stable appearance of the bilateral proximal renal artery stents, which appear patent without high-grade stenosis. IMA: Chronic occlusion. Inflow: No high-grade iliac graft or common femoral artery stenoses. 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 Veins: No obvious venous abnormality within the limitations of this arterial phase study. Review of the MIP images confirms the above findings. NON-VASCULAR Hepatobiliary: Normal liver size. Stable subcentimeter hypodense anterior liver  lesion, too small to characterize. No new liver lesions. Cholecystectomy.  No biliary ductal dilatation. Pancreas: Normal, with no mass or duct dilation. Spleen: Normal size. No mass. Adrenals/Urinary Tract: Normal adrenals. Stable scattered subcentimeter hypodense renal cortical lesions, too small to characterize, requiring no follow-up. No new renal lesions. No hydronephrosis. Normal bladder. Stomach/Bowel: Normal non-distended stomach. Several mildly dilated small bowel loops throughout the abdomen up to 3.1 cm diameter, with air-fluid levels. No definite small bowel wall thickening or pneumatosis. Distal small bowel is collapsed. No discrete small bowel caliber transition identified. Appendix not discretely visualized. Mild sigmoid diverticulosis, with no large bowel wall thickening or significant pericolonic fat stranding. Vascular/Lymphatic: No pathologically enlarged lymph nodes in the abdomen or pelvis. Reproductive: Mild prostatomegaly. Nonspecific internal prostatic calcifications. Other: No pneumoperitoneum. Small volume ascites, mildly increased from prior. No focal fluid collections. Musculoskeletal: No aggressive appearing focal osseous lesions. Moderate thoracic spondylosis. Review of the MIP images confirms the above findings. IMPRESSION: 1. Mildly dilated small bowel loops throughout the abdomen with collapsed distal small bowel. Findings are concerning for mild mechanical distal small bowel obstruction, although no discrete caliber transition is identified. Differential includes adynamic ileus. No free air. 2. Subsolid 4.6 cm superior segment right lower lobe masslike opacity with 2.4 cm solid component, persistent and unchanged since recent CT. Primary bronchogenic carcinoma not excluded in this high risk patient with smoking related changes in the lungs. Outpatient PET-CT suggested for further evaluation when clinically feasible. 3. Long segment thoracoabdominal aortic aneurysm status post stent  graft repair, with interval placement of stent grafts in the descending thoracic aorta. No evidence of an endoleak. The lower descending thoracic aortic aneurysm sac measures 8.1 cm maximum diameter, slightly decreased. Tiny focus of nonspecific gas within the descending thoracic aortic aneurysm sac, presumably due to recent intervention. 4. Narrowing of the SMA stent at the origin, approximately 50%, appearing slightly worsened. SMA remains patent. 5. Small dependent bilateral pleural effusions. Passive atelectasis in the left greater than right lower lobes. 6. Aortic Atherosclerosis (ICD10-I70.0) and Emphysema (ICD10-J43.9). Electronically Signed   By: Ilona Sorrel M.D.   On: 05/19/2019 15:47    Anti-infectives: Anti-infectives (From admission, onward)   None      Assessment/Plan: 74 year old male with ileus versus partial bowel obstruction versus hypokinetic GI tract. Patient Active Problem List   Diagnosis Date Noted  . SBO (small bowel obstruction) ([place]) 05/19/2019  . Hemochromatosis   . Tobacco abuse   . Coronary artery disease involving native coronary artery of native heart with angina pectoris ([place]) 01/31/2018  . Essential hypertension 01/31/2018  . Hyperlipidemia with target LDL less than 70 01/31/2018  . Acute ST elevation myocardial infarction (STEMI) involving left anterior descending (LAD) coronary artery ([place]) 01/31/2018  . Acute combined systolic and diastolic heart failure ([place]) 01/31/2018  . Ischemic cardiomyopathy 01/31/2018  . History of abdominal aortic aneurysm (AAA) repair 2008  . 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  Continue with small bowel obstruction protocol.  Of note patient did have oral contrast 3 days ago at [facility].  This appears to be a contrast that is within his colon.  We will continue NG tube. 2.  Patient will benefit from enemas to help with bowel movements. 3.  Would recommend GI consult to discuss  endoscopy for possible chronic partial obstruction. 4.  We will continue to follow.    LOS: 1 day    Ralene Ok 05/20/2019

## 2019-05-20 NOTE — Progress Notes (Signed)
    GI [facility] note I saw this man 2/17 in office - was completing that note today and saw he was in house.  Please see that note.  I had rxed Amitiza but it was denied.   Would use MiraLax and dulcolax to treat constipation.  He is malnourished, has hemochromatosis, and in need of a colonoscopy but not until he is improved.  I also suspect he is depressed.  He is due to see me in f/u 3/18  Gatha Mayer, MD, [place]

## 2019-05-20 NOTE — Progress Notes (Signed)
PROGRESS NOTE    Patrick Adams  XBL:[id number]  DOB: 09/29/1945  DOA: 05/19/2019 PCP: [name], MD Outpatient Specialists:   Hospital course: 74 year old man with hemochromatosis, CAD, ischemic CM, status post endovascular AAA repair at [facility] last week was admitted to 05/19/2019 with ileus versus SBO.  His postop course at [facility] was also complicated by ileus which was treated conservatively.  Patient was seen by general surgery who recommended conservative therapy with NG tube and IV hydration.  Of note patient states he has not had a bowel movement since November.  NG tube seems to be draining feculent material.  All regimen with enemas started 05/20/2019.  Subjective:  Patient and his sister both feel very frustrated that he was discharged from [facility] before he was fully functional.  They are frustrated that he is back in the hospital.  They are concerned that they will be discharged from the hospital before he is able to eat.  Patient sister in particular notes that he has had significant weight loss over the past year and she is worried about his nutrition status.  Patient himself admits to feeling weak and tired.  Notes he is tolerating the NG tube okay.  He did have an enema last night and he did have some small stool output today.  Objective: Vitals:   05/20/19 0314 05/20/19 0621 05/20/19 1002 05/20/19 1500  BP: 126/69 (!) 157/93 (!) 158/88 (!) 154/95  Pulse: 86 90 90 84  Resp: 18  18 18   Temp: 98 F (36.7 C) 98.7 F (37.1 C) 98 F (36.7 C) 97.8 F (36.6 C)  TempSrc: Oral Oral Oral Axillary  SpO2: 97% 95% 100% 98%    Intake/Output Summary (Last 24 hours) at 05/20/2019 1649 Last data filed at 05/20/2019 1300 Gross per 24 hour  Intake 988.01 ml  Output 1350 ml  Net -361.99 ml   There were no vitals filed for this visit.   Assessment & Plan:   74 year old man presents with SBO 2 days after discharge from [facility] status post endovascular repair of AAA leak.  Postop  course at [facility] [place] was complicated by ileus which was treated conservatively.  Patient now presents with likely recurrent ileus.  NG tube is draining feculent material.  Patient has not had a bowel movement since November.  SBO NG tube has drained about 800 cc of feculent appearing material since yesterday. Diminished soft and not really distended with minimal tenderness in the epigastric region. Patient had a small stool output with enema earlier today. Followed by general surgery who recommend ongoing conservative management. Follow electrolytes and ins and outs closely Normal saline at 100 cc an hour, need to be careful with fluids given known ischemic cardiomyopathy.  Constipation Patient states he is not had a good bowel movement since November. There does not appear to be a huge stool burden on imaging but NG output looks feculent. Will treat patient with smog enema x1 now Patient was seen by Dr. Carlean Purl of  [facility] as an outpatient the day before admission, Amitiza was prescribed but outpatient Amitiza declined by insurance. Will start Amitiza in house here which hopefully will help with mobility. Patient does have hemochromatosis and may well have slow/sluggish gut from that.  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 Discussed with patient, he states that his PCP in [place] has been following something in his lung for several years which has not changed.  PET scan had been recommended as an outpatient. We will try to contact PCP tomorrow to see if further work-up is warranted here or this can be done as an outpatient.  Hemochromatosis Patient was receiving routine phlebotomies until 2 years ago.   Can consider consulting heme-onc for possible iron overload  Hyponatremia Resolved  S/p AAA repair No evidence for recurrent leak today status post endovascular repair at [facility] last  week.  HTN Continue losartan  CAD No chest pain or evidence of ACS. Continue aspirin and Plavix.  CHF No evidence for acute decompensation. Need to be careful with IV fluids. Patient does not appear to be on a beta-blocker as an outpatient. Continue losartan.   DVT prophylaxis: Lovenox Code Status: Full Family Communication: Spoke with patient and his sister at length in room Disposition Plan: Home   Consultants:  General surgery  Procedures:  NG tube placement  Antimicrobials:  None   Exam:  General: Thin man with NG tube in place draining fair amount of feculent appearing brown material. Eyes: sclera anicteric, conjuctiva mild injection bilaterally CVS: S1-S2 Respiratory:  normal effort, symmetrical excursion, CTA without adventitious sounds.  GI: NABS, soft, minimal tenderness in the hypogastric region.  No rebound.  Nondistended.  Soft.   LE: No edema. No cyanosis Neuro: A/O x 3, Moving all extremities equally with normal strength, CN 3-12 intact, grossly nonfocal.  Psych: patient is logical and coherent, judgement and insight appear normal, mood and affect appropriate to situation.   Data Reviewed: Basic Metabolic Panel: Recent Labs  Lab 05/19/19 1325 05/20/19 0220  NA 133* 135  K 4.4 4.1  CL 103 99  CO2 17* 21*  GLUCOSE 127* 90  BUN 20 19  CREATININE 1.33* 1.30*  CALCIUM 9.0 8.9   Liver Function Tests: Recent Labs  Lab 05/19/19 1325 05/20/19 0220  AST 16 14*  ALT 12 11  ALKPHOS 81 82  BILITOT 0.7 1.0  PROT 6.1* 5.7*  ALBUMIN 2.8* 2.6*   Recent Labs  Lab 05/19/19 1325  LIPASE 20   No results for input(s): AMMONIA in the last 168 hours. CBC: Recent Labs  Lab 05/19/19 1325 05/20/19 0220  WBC 15.9* 9.8  NEUTROABS 14.6*  --   HGB 12.3* 11.4*  HCT 40.2 34.3*  MCV 95.3 87.3  PLT 409* 458*   Cardiac Enzymes: No results for input(s): CKTOTAL, CKMB, CKMBINDEX, TROPONINI in the last 168 hours. BNP (last 3 results) No  results for input(s): PROBNP in the last 8760 hours. CBG: Recent Labs  Lab 05/19/19 1333  GLUCAP 122*    Recent Results (from the past 240 hour(s))  Culture, blood (routine x 2)     Status: None (Preliminary result)   Collection Time: 05/19/19  3:50 PM   Specimen: BLOOD  Result Value Ref Range Status   Specimen Description BLOOD SITE NOT SPECIFIED  Final   Special Requests   Final    BOTTLES DRAWN AEROBIC ONLY Blood Culture results may not be optimal due to an inadequate volume of blood received in culture bottles   Culture   Final    NO GROWTH < 12 HOURS Performed at [facility], [place] [address], [place], [place] [address]    Report Status PENDING  Incomplete  Culture, blood (routine x 2)     Status: None (Preliminary result)   Collection Time: 05/19/19  4:00 PM   Specimen: BLOOD RIGHT ARM  Result Value Ref Range Status   Specimen Description BLOOD RIGHT ARM  Final   Special Requests   Final    BOTTLES DRAWN AEROBIC AND ANAEROBIC Blood Culture adequate volume   Culture   Final    NO GROWTH < 12 HOURS Performed at [facility], [address] [place], [place] [address]    Report Status PENDING  Incomplete  SARS CORONAVIRUS 2 (TAT 6-24 HRS) Nasopharyngeal Nasopharyngeal Swab     Status: None   Collection Time: 05/19/19  4:32 PM   Specimen: Nasopharyngeal Swab  Result Value Ref Range Status   SARS Coronavirus 2 NEGATIVE NEGATIVE Final    Comment: (NOTE) SARS-CoV-2 target nucleic acids are NOT DETECTED. The SARS-CoV-2 RNA is generally detectable in upper and lower respiratory specimens during the acute phase of infection. Negative results do not preclude SARS-CoV-2 infection, do not rule out co-infections with other pathogens, and should not be used as the sole basis for treatment or other patient management decisions. Negative results must be combined with clinical observations, patient history, and epidemiological information. The expected result is  Negative. Fact Sheet for Patients: SugarRoll.be Fact Sheet for Healthcare Providers: https://www.woods-mathews.com/ This test is not yet approved or cleared by the [place] FDA and  has been authorized for detection and/or diagnosis of SARS-CoV-2 by FDA under an Emergency Use Authorization (EUA). This EUA will remain  in effect (meaning this test can be used) for the duration of the COVID-19 declaration under Section 56 4(b)(1) of the Act, 21 U.S.C. section 360bbb-3(b)(1), unless the authorization is terminated or revoked sooner. Performed at [facility], [place] [address], [place], [place] [address]       Studies: DG Abd Acute W/Chest  Result Date: 05/19/2019 CLINICAL DATA:  Chronic 8 month history of midline abdominal pain associated with nausea and vomiting. Prior thoracic and abdominal aortic stent grafting. EXAM: DG ABDOMEN ACUTE W/ 1V CHEST COMPARISON:  CT angio chest abdomen and pelvis 05/07/2019. Chest x-ray 02/01/2018. FINDINGS: Mild gaseous distension of adjacent loops of jejunum in the LEFT UPPER QUADRANT demonstrating air-fluid levels on the ERECT image. No free intraperitoneal air. Expected stool burden and residual contrast material throughout the normal caliber and decompressed colon, with numerous diverticula involving the sigmoid colon; it is unclear where the contrast material originated, as it was not given for the recent CTA and there are no interval imaging studies since then. Thoracic and abdominal aortic stent grafting with stenting of the renal arteries. Cardiac silhouette mildly enlarged, unchanged. The opacity in the superior segment RIGHT LOWER LOBE identified on the recent CTA has improved is visible though much less conspicuous on the chest x-ray. Linear scarring is present in the RIGHT LOWER LOBE in a vertical orientation, unchanged. New linear atelectasis at the LEFT lung base. IMPRESSION: 1. Partial small bowel  obstruction involving the jejunum in the LEFT UPPER QUADRANT. No free intraperitoneal air. 2. Diverticulosis involving the sigmoid colon. 3. The superior segment RIGHT LOWER LOBE opacity identified on the recent CTA is still present though much less conspicuous on chest x-ray. The recommendation for a follow-up CT in 3 months, early May, 2021, remains. 4. New atelectasis involving the LEFT lung base. No acute cardiopulmonary disease otherwise. Electronically Signed   By: Evangeline Dakin M.D.   On: 05/19/2019 13:37   DG Abd Portable 1V-Small Bowel Obstruction Protocol-initial, 8 hr delay  Result Date: 05/20/2019 CLINICAL DATA:  Small-bowel obstruction. Image obtained 8 hours after injection of contrast through the patient's nasogastric tube. EXAM: PORTABLE ABDOMEN - 1 VIEW COMPARISON:  Yesterday. Chest, abdomen  and pelvis CTA obtained yesterday. FINDINGS: Interval dilute contrast in multiple mildly dilated loops of small bowel. No contrast in the distal small bowel. Previously demonstrated contrast in the colon and urinary bladder. Stable vascular stents and hernia repair mesh. The nasogastric tube has been advanced with its tip and side hole in the proximal stomach. Cholecystectomy clips and single right lower quadrant abdominal surgical clip. Moderate levoconvex lumbar rotary scoliosis and degenerative changes. IMPRESSION: Interval dilute contrast in multiple mildly dilated loops of small bowel. This is compatible with partial small bowel obstruction, without significant change. The contrast has not progressed into the distal small bowel at this point. A repeat image could be obtained in 8 hours to evaluate for distal progression. Electronically Signed   By: Claudie Revering M.D.   On: 05/20/2019 09:40   DG Abd Portable 1V-Small Bowel Protocol-Position Verification  Result Date: 05/19/2019 CLINICAL DATA:  NG tube placement EXAM: PORTABLE ABDOMEN - 1 VIEW COMPARISON:  None. FINDINGS: The distal tip of the NG  tube is in the left upper quadrant, likely within the gastric fundus. The side port appears to be just above the GE junction. Contrast in the bladder and colon. Vascular stents identified. No evidence of bowel obstruction. IMPRESSION: 1. The distal tip of the NG tube is in the stomach with the side port is just above the GE junction. Recommend advancing 4-6 cm. 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 Patient presents with abdominal pain. EXAM: CT ANGIOGRAPHY CHEST, ABDOMEN AND PELVIS TECHNIQUE: Multidetector CT imaging through the chest, abdomen and pelvis was performed using the standard protocol during bolus administration of intravenous contrast. Multiplanar reconstructed images and MIPs were obtained and reviewed to evaluate the vascular anatomy. CONTRAST:  137mL OMNIPAQUE IOHEXOL 350 MG/ML SOLN COMPARISON:  05/07/2019 CT angiogram of the chest, abdomen and pelvis. FINDINGS: CTA CHEST FINDINGS Cardiovascular: Normal heart size. No significant pericardial effusion/thickening. Left anterior descending and right coronary atherosclerosis. Atherosclerotic thoracic aorta. Long segment thoracoabdominal aortic aneurysm extending from the mid descending thoracic aorta into the infrarenal abdominal aorta status post stent graft repair, with interval placement of stent grafts in entire descending thoracic aorta with previous stent graft in the abdominal aorta. Descending thoracic aorta is tortuous. The lower descending thoracic aortic aneurysm sac measures 8.1 cm maximum diameter (series 11/image 87), compared to 8.3 cm on 05/07/2019, slightly decreased. Tiny focus of nonspecific gas within descending thoracic aortic aneurysm sac. No acute  intramural hematoma or acute dissection in the thoracic aorta. Aortic arch branch vessels are patent. No appreciable endoleak in the thoracic aorta. Normal caliber main pulmonary artery. No central pulmonary emboli. Mediastinum/Nodes: No discrete thyroid nodules. Unremarkable esophagus. No pathologically enlarged axillary, mediastinal or hilar lymph nodes. Lungs/Pleura: No pneumothorax. Small dependent bilateral pleural effusions. Mild centrilobular and paraseptal emphysema. Stable 4 mm basilar right upper lobe solid pulmonary nodule along the minor fissure (series 5/image 34). Sub solid 4.6 x 2.5 cm masslike opacity in the superior segment right lower lobe (series 5/image 29) with 2.4 cm solid component, unchanged since 05/07/2019 chest CT using similar measurement technique. Passive atelectasis in the left greater than right lower lobes. No new significant pulmonary nodules. Musculoskeletal: No aggressive appearing focal osseous lesions. Mild thoracic spondylosis. Review of the MIP images  confirms the above findings. CTA ABDOMEN AND PELVIS FINDINGS VASCULAR Aorta: Prior abdominal aortic stent graft repair long segment suprarenal and infrarenal abdominal aortic aneurysm, with maximum abdominal aortic aneurysm sac diameter measuring 6.7 cm in the suprarenal abdominal aorta (series 11/image 81), unchanged. No evidence of an endoleak in abdominal aorta. Prior aorto bi-iliac surgical graft repair, which appears patent. Celiac: Stable stent within the celiac trunk, which appears patent. SMA: Stable proximal SMA stent, which demonstrates approximately 50% luminal narrowing at the origin (series 14/image 119), slightly worsened since 05/07/2019. SMA patent distal to the SMA stent with no thrombosis. Renals: Stable appearance of the bilateral proximal renal artery stents, which appear patent without high-grade stenosis. IMA: Chronic occlusion. Inflow: No high-grade iliac graft or common femoral artery stenoses. 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 Veins: No obvious venous abnormality within the limitations of this arterial phase study. Review of the MIP images confirms the above findings. NON-VASCULAR Hepatobiliary: Normal liver size. Stable subcentimeter hypodense anterior liver lesion, too small to characterize. No new liver lesions. Cholecystectomy. No biliary ductal dilatation. Pancreas: Normal, with no mass or duct dilation. Spleen: Normal size. No mass. Adrenals/Urinary Tract: Normal adrenals. Stable scattered subcentimeter hypodense renal cortical lesions, too small to characterize, requiring no follow-up. No new renal lesions. No hydronephrosis. Normal bladder. Stomach/Bowel: Normal non-distended stomach. Several mildly dilated small bowel loops throughout the abdomen up to 3.1 cm diameter, with air-fluid levels. No definite small bowel wall thickening or pneumatosis. Distal small bowel is collapsed. No discrete small bowel caliber transition identified. Appendix not discretely visualized. Mild sigmoid diverticulosis, with no large bowel wall thickening or significant pericolonic fat stranding. Vascular/Lymphatic: No pathologically enlarged lymph nodes in the abdomen or pelvis. Reproductive: Mild prostatomegaly. Nonspecific internal prostatic calcifications. Other: No pneumoperitoneum. Small volume ascites, mildly increased from prior. No focal fluid collections. Musculoskeletal: No aggressive appearing focal osseous lesions. Moderate thoracic spondylosis. Review of the MIP images confirms the above findings. IMPRESSION: 1. Mildly dilated small bowel loops throughout the abdomen with collapsed distal small bowel. Findings are concerning for mild mechanical distal small bowel obstruction, although no discrete caliber transition is identified. Differential includes adynamic ileus. No free air. 2. Subsolid 4.6 cm superior segment right lower lobe masslike opacity with 2.4 cm solid component, persistent and  unchanged since recent CT. Primary bronchogenic carcinoma not excluded in this high risk patient with smoking related changes in the lungs. Outpatient PET-CT suggested for further evaluation when clinically feasible. 3. 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 No evidence of an endoleak. The lower descending thoracic aortic aneurysm sac measures 8.1 cm maximum diameter, slightly decreased. Tiny focus of nonspecific gas within the descending thoracic aortic aneurysm sac, presumably due to recent intervention. 4. Narrowing of the SMA stent at the origin, approximately 50%, appearing slightly worsened. SMA remains patent. 5. Small dependent bilateral pleural effusions. Passive atelectasis in the left greater than right lower lobes. 6. Aortic Atherosclerosis (ICD10-I70.0) and Emphysema (ICD10-J43.9). Electronically Signed   By: Ilona Sorrel M.D.   On: 05/19/2019 15:47     Scheduled Meds: . aspirin EC  81 mg Oral QHS  . bisacodyl  10 mg Rectal Once  . clopidogrel  75 mg Oral QHS  . enoxaparin (LOVENOX) injection  40 mg Subcutaneous Q24H  . losartan  25 mg Oral QHS   Continuous Infusions:  Principal Problem:   SBO (small bowel obstruction) (HCC) Active Problems:  Essential hypertension   Acute combined systolic and diastolic heart failure ([place])   Ischemic cardiomyopathy   History of abdominal aortic aneurysm (AAA) repair   Hemochromatosis     Vashti Hey, MD, FACP, [facility]. [facility]  If 7PM-7AM, please contact night-coverage [email or web address] Password [facility] 05/20/2019, 4:49 PM    LOS: 1 day

## 2019-05-20 NOTE — Plan of Care (Signed)

## 2019-05-21 LAB — CBC
HCT: 31.2 % — ABNORMAL LOW (ref 39.0–52.0)
Hemoglobin: 10.1 g/dL — ABNORMAL LOW (ref 13.0–17.0)
MCH: 29.1 pg (ref 26.0–34.0)
MCHC: 32.4 g/dL (ref 30.0–36.0)
MCV: 89.9 fL (ref 80.0–100.0)
Platelets: 442 10*3/uL — ABNORMAL HIGH (ref 150–400)
RBC: 3.47 MIL/uL — ABNORMAL LOW (ref 4.22–5.81)
RDW: 15.7 % — ABNORMAL HIGH (ref 11.5–15.5)
WBC: 7.2 10*3/uL (ref 4.0–10.5)
nRBC: 0 % (ref 0.0–0.2)

## 2019-05-21 LAB — PHOSPHORUS: Phosphorus: 3.9 mg/dL (ref 2.5–4.6)

## 2019-05-21 LAB — BASIC METABOLIC PANEL
Anion gap: 13 (ref 5–15)
BUN: 22 mg/dL (ref 8–23)
CO2: 26 mmol/L (ref 22–32)
Calcium: 8.4 mg/dL — ABNORMAL LOW (ref 8.9–10.3)
Chloride: 101 mmol/L (ref 98–111)
Creatinine, Ser: 1.29 mg/dL — ABNORMAL HIGH (ref 0.61–1.24)
GFR calc Af Amer: >60 mL/min (ref >60)
GFR calc non Af Amer: 55 mL/min — ABNORMAL LOW (ref >60)
Glucose, Bld: 78 mg/dL (ref 70–99)
Potassium: 3.6 mmol/L (ref 3.5–5.1)
Sodium: 140 mmol/L (ref 135–145)

## 2019-05-21 LAB — MAGNESIUM: Magnesium: 1.9 mg/dL (ref 1.7–2.4)

## 2019-05-21 MED ORDER — SODIUM CHLORIDE 0.45 % IV SOLN: INTRAVENOUS | Status: DC

## 2019-05-21 MED ORDER — SORBITOL 70 % SOLN
960.0000 mL | TOPICAL_OIL | Freq: Once | ORAL | Status: AC
  Administered 2019-05-21: 960 mL via RECTAL
  Filled 2019-05-21: qty 473

## 2019-05-21 MED ORDER — ONDANSETRON HCL 4 MG/2ML IJ SOLN
4.0000 mg | Freq: Four times a day (QID) | INTRAMUSCULAR | Status: DC | PRN
  Administered 2019-05-21 – 2019-05-23 (×2): 4 mg via INTRAVENOUS
  Filled 2019-05-21 (×3): qty 2

## 2019-05-21 MED ORDER — POTASSIUM CHLORIDE IN NACL 20-0.45 MEQ/L-% IV SOLN
INTRAVENOUS | Status: DC
  Filled 2019-05-21 (×5): qty 1000

## 2019-05-21 NOTE — Progress Notes (Signed)
I spoke with Dr. Jamse Arn.  Per Surgery, a GI consult was recommended to evaluate for possible chronic partial obstruction.  Review of the chart does not indicate any overt evidence of a stricture.  Per Dr. Jamse Arn, he is responding to [facility] enemas.  At this time there is no new recommendation from a GI standpoint that would change his care, but I am available if there are any questions or changes in his clinical status.

## 2019-05-21 NOTE — Progress Notes (Signed)
PROGRESS NOTE    Patrick Adams  OAC:[id number]  DOB: 1946-02-26  DOA: 05/19/2019 PCP: Patrick Pries, MD Outpatient Specialists:   Hospital course: 74 year old man with hemochromatosis, CAD, ischemic CM, status post endovascular AAA repair at [facility] last week was admitted to 05/19/2019 with ileus versus SBO.  His postop course at [facility] was also complicated by ileus which was treated conservatively.  Patient was seen by general surgery who recommended conservative therapy with NG tube and IV hydration.  Of note patient states he has not had a bowel movement since November.  NG tube seems to be draining feculent material.  Bowel regimen with enemas started 05/20/2019.  Subjective:  Patient states he feels little better this morning.  States he had a good bowel movement after the smog enema yesterday.  He is wondering why stool is not formed but rather pasty.  Notes abdominal pain is also much improved.  Is tolerating his NG tube.  Objective: Vitals:   05/20/19 1500 05/20/19 1757 05/20/19 2046 05/21/19 0457  BP: (!) 154/95 (!) 153/84 (!) 155/86 (!) 146/73  Pulse: 84 83 81 75  Resp: 18 18 19 17   Temp: 97.8 F (36.6 C) 97.9 F (36.6 C) 98.4 F (36.9 C)   TempSrc: Axillary Oral Oral Oral  SpO2: 98% 99% 99% 95%    Intake/Output Summary (Last 24 hours) at 05/21/2019 1358 Last data filed at 05/21/2019 1346 Gross per 24 hour  Intake 1507.21 ml  Output 2550 ml  Net -1042.79 ml   There were no vitals filed for this visit.   Assessment & Plan:   74 year old man presents with SBO 2 days after discharge from [facility] status post endovascular repair of AAA leak.  Postop course at [facility] was complicated by ileus which was treated conservatively.  Patient now presents with likely recurrent ileus.  NG tube is draining feculent material.  Patient is responding to smog enemas.  SBO Patient apparently has had 2500 cc output over the past 24 hours per documentation although I suspect a liter of that  is from the smog enema that he had. To my eye it appears that his NG tube output is less than it was yesterday, but output is still feculent. Patient is -1 L for the past 24 hours, will start half-normal saline at 75 cc an hour today. Will need to be careful given known HFrEF. Abdominal exam is softer and no longer tender.Seen by general surgery today who recommend GI evaluation for possible stricture. Place GI consult.  Constipation Patient had a large bowel movement to smog enema yesterday. Repeat smog enema today. Patient states he had not had a good bowel movement since November. There does not appear to be a huge stool burden on imaging but NG output looks feculent. Patient was seen by Dr. Carlean Adams of  [facility] as an outpatient the day before admission, Amitiza was prescribed but outpatient Amitiza declined by insurance. Continue Amitiza in house.  Patient does have hemochromatosis and may well have slow/sluggish gut from that.  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 Discussed with patient, he states that his PCP in [place] has been following something in his lung for several years which has not changed. PET scan had been recommended as an outpatient. We will try to contact PCP tomorrow to see if further work-up is warranted here or this can be done as an outpatient.  Hemochromatosis Patient was receiving routine  phlebotomies until 2 years ago.   Can consider consulting heme-onc for possible iron overload  Hyponatremia Resolved  S/p AAA repair No evidence for recurrent leak today status post endovascular repair at [facility] last week.  HTN Continue losartan  CAD No chest pain or evidence of ACS. Continue aspirin and Plavix.  CHF No evidence for acute decompensation. Need to be careful with IV fluids. Patient does not appear to be on a beta-blocker as an outpatient. Continue  losartan.   DVT prophylaxis: Lovenox Code Status: Full Family Communication: Spoke with patient and his sister at length yesterday  disposition Plan: Home   Consultants:  General surgery  [facility]  Procedures:  NG tube placement  Antimicrobials:  None   Exam:  General: Thin man with NG tube in place draining fair amount of feculent appearing brown material. Eyes: sclera anicteric, conjuctiva mild injection bilaterally CVS: S1-S2 Respiratory:  normal effort, symmetrical excursion, CTA without adventitious sounds.  GI: NABS, soft, minimal tenderness in the hypogastric region.  No rebound.  Nondistended.  Soft.   LE: No edema. No cyanosis Neuro: A/O x 3, Moving all extremities equally with normal strength, CN 3-12 intact, grossly nonfocal.  Psych: patient is logical and coherent, judgement and insight appear normal, mood and affect appropriate to situation.   Data Reviewed: Basic Metabolic Panel: Recent Labs  Lab 05/19/19 1325 05/20/19 0220 05/21/19 0255  NA 133* 135 140  K 4.4 4.1 3.6  CL 103 99 101  CO2 17* 21* 26  GLUCOSE 127* 90 78  BUN 20 19 22   CREATININE 1.33* 1.30* 1.29*  CALCIUM 9.0 8.9 8.4*  MG  --   --  1.9  PHOS  --   --  3.9   Liver Function Tests: Recent Labs  Lab 05/19/19 1325 05/20/19 0220  AST 16 14*  ALT 12 11  ALKPHOS 81 82  BILITOT 0.7 1.0  PROT 6.1* 5.7*  ALBUMIN 2.8* 2.6*   Recent Labs  Lab 05/19/19 1325  LIPASE 20   No results for input(s): AMMONIA in the last 168 hours. CBC: Recent Labs  Lab 05/19/19 1325 05/20/19 0220 05/21/19 0255  WBC 15.9* 9.8 7.2  NEUTROABS 14.6*  --   --   HGB 12.3* 11.4* 10.1*  HCT 40.2 34.3* 31.2*  MCV 95.3 87.3 89.9  PLT 409* 458* 442*   Cardiac Enzymes: No results for input(s): CKTOTAL, CKMB, CKMBINDEX, TROPONINI in the last 168 hours. BNP (last 3 results) No results for input(s): PROBNP in the last 8760 hours. CBG: Recent Labs  Lab 05/19/19 1333  GLUCAP 122*    Recent  Results (from the past 240 hour(s))  Culture, blood (routine x 2)     Status: None (Preliminary result)   Collection Time: 05/19/19  3:50 PM   Specimen: BLOOD  Result Value Ref Range Status   Specimen Description BLOOD SITE NOT SPECIFIED  Final   Special Requests   Final    BOTTLES DRAWN AEROBIC ONLY Blood Culture results may not be optimal due to an inadequate volume of blood received in culture bottles   Culture NO GROWTH 2 DAYS  Final   Report Status PENDING  Incomplete  Culture, blood (routine x 2)     Status: None (Preliminary result)   Collection Time: 05/19/19  4:00 PM   Specimen: BLOOD RIGHT ARM  Result Value Ref Range Status   Specimen Description BLOOD RIGHT ARM  Final   Special Requests   Final    BOTTLES DRAWN AEROBIC  AND ANAEROBIC Blood Culture adequate volume   Culture NO GROWTH 2 DAYS  Final   Report Status PENDING  Incomplete  SARS CORONAVIRUS 2 (TAT 6-24 HRS) Nasopharyngeal Nasopharyngeal Swab     Status: None   Collection Time: 05/19/19  4:32 PM   Specimen: Nasopharyngeal Swab  Result Value Ref Range Status   SARS Coronavirus 2 NEGATIVE NEGATIVE Final    Comment: (NOTE) SARS-CoV-2 target nucleic acids are NOT DETECTED. The SARS-CoV-2 RNA is generally detectable in upper and lower respiratory specimens during the acute phase of infection. Negative results do not preclude SARS-CoV-2 infection, do not rule out co-infections with other pathogens, and should not be used as the sole basis for treatment or other patient management decisions. Negative results must be combined with clinical observations, patient history, and epidemiological information. The expected result is Negative. Fact Sheet for Patients: SugarRoll.be Fact Sheet for Healthcare Providers: https://www.woods-mathews.com/ This test is not yet approved or cleared by the [place] FDA and  has been authorized for detection and/or diagnosis of SARS-CoV-2  by FDA under an Emergency Use Authorization (EUA). This EUA will remain  in effect (meaning this test can be used) for the duration of the COVID-19 declaration under Section 56 4(b)(1) of the Act, 21 U.S.C. section 360bbb-3(b)(1), unless the authorization is terminated or revoked sooner. Performed at [facility], [place] [address], [place], [place] [address]       Studies: DG Abd 2 Views  Result Date: 05/20/2019 CLINICAL DATA:  74 year old male status post tube placement. EXAM: ABDOMEN - 2 VIEW COMPARISON:  Earlier radiograph dated 05/20/2019. FINDINGS: Partially visualized enteric tube with side port and tip in the left upper abdomen, likely in the proximal stomach. Mildly dilated loops of small bowel containing diluted contrast again noted. There appears to be new contrast in the cecum likely representing contrast traversing into the colon. Continued follow-up recommended. No other interval change since the earlier radiograph. IMPRESSION: 1. Enteric tube in the proximal stomach. 2. Probable progression of contrast into the cecum. Continued follow-up recommended. Electronically Signed   By: Anner Crete M.D.   On: 05/20/2019 18:41   DG Abd Portable 1V-Small Bowel Obstruction Protocol-initial, 8 hr delay  Result Date: 05/20/2019 CLINICAL DATA:  Small-bowel obstruction. Image obtained 8 hours after injection of contrast through the patient's nasogastric tube. EXAM: PORTABLE ABDOMEN - 1 VIEW COMPARISON:  Yesterday. Chest, abdomen and pelvis CTA obtained yesterday. FINDINGS: Interval dilute contrast in multiple mildly dilated loops of small bowel. No contrast in the distal small bowel. Previously demonstrated contrast in the colon and urinary bladder. Stable vascular stents and hernia repair mesh. The nasogastric tube has been advanced with its tip and side hole in the proximal stomach. Cholecystectomy clips and single right lower quadrant abdominal surgical clip. Moderate levoconvex lumbar  rotary scoliosis and degenerative changes. IMPRESSION: Interval dilute contrast in multiple mildly dilated loops of small bowel. This is compatible with partial small bowel obstruction, without significant change. The contrast has not progressed into the distal small bowel at this point. A repeat image could be obtained in 8 hours to evaluate for distal progression. Electronically Signed   By: Claudie Revering M.D.   On: 05/20/2019 09:40   DG Abd Portable 1V-Small Bowel Protocol-Position Verification  Result Date: 05/19/2019 CLINICAL DATA:  NG tube placement EXAM: PORTABLE ABDOMEN - 1 VIEW COMPARISON:  None. FINDINGS: The distal tip of the NG tube is in the left upper quadrant, likely within the gastric fundus. The side port  appears to be just above the GE junction. Contrast in the bladder and colon. Vascular stents identified. No evidence of bowel obstruction. IMPRESSION: 1. The distal tip of the NG tube is in the stomach with the side port is just above the GE junction. Recommend advancing 4-6 cm. 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 Patient presents with abdominal pain. EXAM: CT ANGIOGRAPHY CHEST, ABDOMEN AND PELVIS TECHNIQUE: Multidetector CT imaging through the chest, abdomen and pelvis was performed using the standard protocol during bolus administration of intravenous contrast. Multiplanar reconstructed images and MIPs were obtained and reviewed to evaluate the vascular anatomy. CONTRAST:  149mL OMNIPAQUE IOHEXOL 350 MG/ML SOLN COMPARISON:  05/07/2019 CT angiogram of the chest, abdomen and pelvis. FINDINGS: CTA CHEST FINDINGS Cardiovascular: Normal heart size. No significant pericardial effusion/thickening. Left anterior  descending and right coronary atherosclerosis. Atherosclerotic thoracic aorta. Long segment thoracoabdominal aortic aneurysm extending from the mid descending thoracic aorta into the infrarenal abdominal aorta status post stent graft repair, with interval placement of stent grafts in entire descending thoracic aorta with previous stent graft in the abdominal aorta. Descending thoracic aorta is tortuous. The lower descending thoracic aortic aneurysm sac measures 8.1 cm maximum diameter (series 11/image 87), compared to 8.3 cm on 05/07/2019, slightly decreased. Tiny focus of nonspecific gas within descending thoracic aortic aneurysm sac. No acute intramural hematoma or acute dissection in the thoracic aorta. Aortic arch branch vessels are patent. No appreciable endoleak in the thoracic aorta. Normal caliber main pulmonary artery. No central pulmonary emboli. Mediastinum/Nodes: No discrete thyroid nodules. Unremarkable esophagus. No pathologically enlarged axillary, mediastinal or hilar lymph nodes. Lungs/Pleura: No pneumothorax. Small dependent bilateral pleural effusions. Mild centrilobular and paraseptal emphysema. Stable 4 mm basilar right upper lobe solid pulmonary nodule along the minor fissure (series 5/image 34). Sub solid 4.6 x 2.5 cm masslike opacity in the superior segment right lower lobe (series 5/image 29) with 2.4 cm solid component, unchanged since 05/07/2019 chest CT using similar measurement technique. Passive atelectasis in the left greater than right lower lobes. No new significant pulmonary nodules. Musculoskeletal: No aggressive appearing focal osseous lesions. Mild thoracic spondylosis. Review of the MIP images confirms the above findings. CTA ABDOMEN AND PELVIS FINDINGS VASCULAR Aorta: Prior abdominal aortic stent graft repair long segment suprarenal and infrarenal abdominal aortic aneurysm, with maximum abdominal aortic aneurysm sac diameter measuring 6.7 cm in the suprarenal abdominal aorta  (series 11/image 81), unchanged. No evidence of an endoleak in abdominal aorta. Prior aorto bi-iliac surgical graft repair, which appears patent. Celiac: Stable stent within the celiac trunk, which appears patent. SMA: Stable proximal SMA stent, which demonstrates approximately 50% luminal narrowing at the origin (series 14/image 119), slightly worsened since 05/07/2019. SMA patent distal to the SMA stent with no thrombosis. Renals: Stable appearance of the bilateral proximal renal artery stents, which appear patent without high-grade stenosis. IMA: Chronic occlusion. Inflow: No high-grade iliac graft or common femoral artery stenoses. 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 Veins: No obvious venous abnormality within the limitations of this arterial phase study. Review of the MIP images confirms the above findings. NON-VASCULAR Hepatobiliary: Normal liver size. Stable subcentimeter hypodense anterior liver lesion, too small to characterize. No new liver lesions. Cholecystectomy. No biliary ductal dilatation. Pancreas: Normal, with  no mass or duct dilation. Spleen: Normal size. No mass. Adrenals/Urinary Tract: Normal adrenals. Stable scattered subcentimeter hypodense renal cortical lesions, too small to characterize, requiring no follow-up. No new renal lesions. No hydronephrosis. Normal bladder. Stomach/Bowel: Normal non-distended stomach. Several mildly dilated small bowel loops throughout the abdomen up to 3.1 cm diameter, with air-fluid levels. No definite small bowel wall thickening or pneumatosis. Distal small bowel is collapsed. No discrete small bowel caliber transition identified. Appendix not discretely visualized. Mild sigmoid diverticulosis, with no large bowel wall thickening or significant pericolonic fat stranding. Vascular/Lymphatic: No pathologically enlarged lymph nodes in the abdomen or pelvis. Reproductive: Mild prostatomegaly. Nonspecific internal prostatic calcifications. Other: No  pneumoperitoneum. Small volume ascites, mildly increased from prior. No focal fluid collections. Musculoskeletal: No aggressive appearing focal osseous lesions. Moderate thoracic spondylosis. Review of the MIP images confirms the above findings. IMPRESSION: 1. Mildly dilated small bowel loops throughout the abdomen with collapsed distal small bowel. Findings are concerning for mild mechanical distal small bowel obstruction, although no discrete caliber transition is identified. Differential includes adynamic ileus. No free air. 2. Subsolid 4.6 cm superior segment right lower lobe masslike opacity with 2.4 cm solid component, persistent and unchanged since recent CT. Primary bronchogenic carcinoma not excluded in this high risk patient with smoking related changes in the lungs. Outpatient PET-CT suggested for further evaluation when clinically feasible. 3. 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 No evidence of an endoleak. The lower descending thoracic aortic aneurysm sac measures 8.1 cm maximum diameter, slightly decreased. Tiny focus of nonspecific gas within the descending thoracic aortic aneurysm sac, presumably due to recent intervention. 4. Narrowing of the SMA stent at the origin, approximately 50%, appearing slightly worsened. SMA remains patent. 5. Small dependent bilateral pleural effusions. Passive atelectasis in the left greater than right lower lobes. 6. Aortic Atherosclerosis (ICD10-I70.0) and Emphysema (ICD10-J43.9). Electronically Signed   By: Ilona Sorrel M.D.   On: 05/19/2019 15:47     Scheduled Meds: . aspirin EC  81 mg Oral QHS  . bisacodyl  10 mg Rectal Once  . clopidogrel  75 mg Oral QHS  . enoxaparin (LOVENOX) injection  40 mg Subcutaneous Q24H  . losartan  25 mg Oral QHS  . lubiprostone  24 mcg Oral BID WC   Continuous Infusions: . lactated ringers 125 mL/hr at 05/21/19  1241    Principal Problem:   SBO (small bowel obstruction) (HCC) Active Problems:   Essential hypertension   Acute combined systolic and diastolic heart failure (HCC)   Ischemic cardiomyopathy   History of abdominal aortic aneurysm (AAA) repair   Hemochromatosis     Vashti Hey, MD, FACP, [facility]. [facility]  If 7PM-7AM, please contact night-coverage [email or web address] Password [email or web address] 05/21/2019, 1:58 PM    LOS: 2 days

## 2019-05-21 NOTE — Progress Notes (Signed)
Patient ID: Patrick Adams, male   DOB: December 21, 1945, 74 y.o.   MRN: [id number] [facility] Surgery Progress Note:   * No surgery found *  Subjective: Mental status is reasonably clear Objective: Vital signs in last 24 hours: Temp:  [97.8 F (36.6 C)-98.4 F (36.9 C)] 98.4 F (36.9 C) (02/19 2046) Pulse Rate:  [75-90] 75 (02/20 0457) Resp:  [17-19] 17 (02/20 0457) BP: (146-158)/(73-95) 146/73 (02/20 0457) SpO2:  [95 %-100 %] 95 % (02/20 0457)  Intake/Output from previous day: 02/19 0701 - 02/20 0700 In: 2495.2 [P.O.:500; I.V.:1995.2] Out: 2650 [Urine:250; Emesis/NG NIOEVO:3500] Intake/Output this shift: No intake/output data recorded.  Physical Exam: Work of breathing is ok with NG in place  Lab Results:  Results for orders placed or performed during the hospital encounter of 05/19/19 (from the past 48 hour(s))  Comprehensive metabolic panel     Status: Abnormal   Collection Time: 05/19/19  1:25 PM  Result Value Ref Range   Sodium 133 (L) 135 - 145 mmol/L   Potassium 4.4 3.5 - 5.1 mmol/L   Chloride 103 98 - 111 mmol/L   CO2 17 (L) 22 - 32 mmol/L   Glucose, Bld 127 (H) 70 - 99 mg/dL   BUN 20 8 - 23 mg/dL   Creatinine, Ser 1.33 (H) 0.61 - 1.24 mg/dL   Calcium 9.0 8.9 - 10.3 mg/dL   Total Protein 6.1 (L) 6.5 - 8.1 g/dL   Albumin 2.8 (L) 3.5 - 5.0 g/dL   AST 16 15 - 41 U/L   ALT 12 0 - 44 U/L   Alkaline Phosphatase 81 38 - 126 U/L   Total Bilirubin 0.7 0.3 - 1.2 mg/dL   GFR calc non Af Amer 53 (L) >60 mL/min   GFR calc Af Amer >60 >60 mL/min   Anion gap 13 5 - 15    Comment: Performed at [facility], [address], [place],  [address]  Lipase, blood     Status: None   Collection Time: 05/19/19  1:25 PM  Result Value Ref Range   Lipase 20 11 - 51 U/L    Comment: Performed at [facility], [place] [address], [place],  [address]  CBC with Differential     Status: Abnormal   Collection Time: 05/19/19  1:25 PM  Result Value Ref Range   WBC 15.9  (H) 4.0 - 10.5 K/uL   RBC 4.22 4.22 - 5.81 MIL/uL   Hemoglobin 12.3 (L) 13.0 - 17.0 g/dL   HCT 40.2 39.0 - 52.0 %   MCV 95.3 80.0 - 100.0 fL   MCH 29.1 26.0 - 34.0 pg   MCHC 30.6 30.0 - 36.0 g/dL   RDW 15.6 (H) 11.5 - 15.5 %   Platelets 409 (H) 150 - 400 K/uL   nRBC 0.0 0.0 - 0.2 %   Neutrophils Relative % 91 %   Neutro Abs 14.6 (H) 1.7 - 7.7 K/uL   Lymphocytes Relative 4 %   Lymphs Abs 0.6 (L) 0.7 - 4.0 K/uL   Monocytes Relative 4 %   Monocytes Absolute 0.6 0.1 - 1.0 K/uL   Eosinophils Relative 0 %   Eosinophils Absolute 0.0 0.0 - 0.5 K/uL   Basophils Relative 0 %   Basophils Absolute 0.0 0.0 - 0.1 K/uL   Immature Granulocytes 1 %   Abs Immature Granulocytes 0.08 (H) 0.00 - 0.07 K/uL    Comment: Performed at [facility], [address], [place], [place]  [address]  CBG monitoring, ED     Status: Abnormal   Collection Time: 05/19/19  1:33 PM  Result Value Ref Range   Glucose-Capillary 122 (H) 70 - 99 mg/dL  Culture, blood (routine x 2)     Status: None (Preliminary result)   Collection Time: 05/19/19  3:50 PM   Specimen: BLOOD  Result Value Ref Range   Specimen Description BLOOD SITE NOT SPECIFIED    Special Requests      BOTTLES DRAWN AEROBIC ONLY Blood Culture results may not be optimal due to an inadequate volume of blood received in culture bottles   Culture NO GROWTH 2 DAYS    Report Status PENDING   Culture, blood (routine x 2)     Status: None (Preliminary result)   Collection Time: 05/19/19  4:00 PM   Specimen: BLOOD RIGHT ARM  Result Value Ref Range   Specimen Description BLOOD RIGHT ARM    Special Requests      BOTTLES DRAWN AEROBIC AND ANAEROBIC Blood Culture adequate volume   Culture NO GROWTH 2 DAYS    Report Status PENDING   Lactic acid, plasma     Status: None   Collection Time: 05/19/19  4:22 PM  Result Value Ref Range   Lactic Acid, Venous 0.6 0.5 - 1.9 mmol/L    Comment: Performed at [facility], [place] [address] [place], [place]  [address]  SARS CORONAVIRUS 2 (TAT 6-24 HRS) Nasopharyngeal Nasopharyngeal Swab     Status: None   Collection Time: 05/19/19  4:32 PM   Specimen: Nasopharyngeal Swab  Result Value Ref Range   SARS Coronavirus 2 NEGATIVE NEGATIVE    Comment: (NOTE) SARS-CoV-2 target nucleic acids are NOT DETECTED. The SARS-CoV-2 RNA is generally detectable in upper and lower respiratory specimens during the acute phase of infection. Negative results do not preclude SARS-CoV-2 infection, do not rule out co-infections with other pathogens, and should not be used as the sole basis for treatment or other patient management decisions. Negative results must be combined with clinical observations, patient history, and epidemiological information. The expected result is Negative. Fact Sheet for Patients: SugarRoll.be Fact Sheet for Healthcare Providers: https://www.woods-mathews.com/ This test is not yet approved or cleared by the [place] FDA and  has been authorized for detection and/or diagnosis of SARS-CoV-2 by FDA under an Emergency Use Authorization (EUA). This EUA will remain  in effect (meaning this test can be used) for the duration of the COVID-19 declaration under Section 56 4(b)(1) of the Act, 21 U.S.C. section 360bbb-3(b)(1), unless the authorization is terminated or revoked sooner. Performed at [facility], [place] [address] [place], [place] [address]   Comprehensive metabolic panel     Status: Abnormal   Collection Time: 05/20/19  2:20 AM  Result Value Ref Range   Sodium 135 135 - 145 mmol/L   Potassium 4.1 3.5 - 5.1 mmol/L   Chloride 99 98 - 111 mmol/L   CO2 21 (L) 22 - 32 mmol/L   Glucose, Bld 90 70 - 99 mg/dL   BUN 19 8 - 23 mg/dL   Creatinine, Ser 1.30 (H) 0.61 - 1.24 mg/dL   Calcium 8.9 8.9 - 10.3 mg/dL   Total Protein 5.7 (L) 6.5 - 8.1 g/dL   Albumin 2.6 (L) 3.5 - 5.0 g/dL   AST 14 (L) 15 - 41 U/L   ALT 11 0 - 44 U/L   Alkaline  Phosphatase 82 38 - 126 U/L   Total Bilirubin 1.0  0.3 - 1.2 mg/dL   GFR calc non Af Amer 55 (L) >60 mL/min   GFR calc Af Amer >60 >60 mL/min   Anion gap 15 5 - 15    Comment: Performed at [facility] [address], [place], [place] [address]  CBC     Status: Abnormal   Collection Time: 05/20/19  2:20 AM  Result Value Ref Range   WBC 9.8 4.0 - 10.5 K/uL   RBC 3.93 (L) 4.22 - 5.81 MIL/uL   Hemoglobin 11.4 (L) 13.0 - 17.0 g/dL   HCT 34.3 (L) 39.0 - 52.0 %   MCV 87.3 80.0 - 100.0 fL    Comment: REPEATED TO VERIFY DELTA CHECK NOTED    MCH 29.0 26.0 - 34.0 pg   MCHC 33.2 30.0 - 36.0 g/dL   RDW 15.6 (H) 11.5 - 15.5 %   Platelets 458 (H) 150 - 400 K/uL   nRBC 0.0 0.0 - 0.2 %    Comment: Performed at [facility], [place] [address] [place], [place] [address]  CBC     Status: Abnormal   Collection Time: 05/21/19  2:55 AM  Result Value Ref Range   WBC 7.2 4.0 - 10.5 K/uL   RBC 3.47 (L) 4.22 - 5.81 MIL/uL   Hemoglobin 10.1 (L) 13.0 - 17.0 g/dL   HCT 31.2 (L) 39.0 - 52.0 %   MCV 89.9 80.0 - 100.0 fL   MCH 29.1 26.0 - 34.0 pg   MCHC 32.4 30.0 - 36.0 g/dL   RDW 15.7 (H) 11.5 - 15.5 %   Platelets 442 (H) 150 - 400 K/uL   nRBC 0.0 0.0 - 0.2 %    Comment: Performed at [facility] [address] [place], [place] [address]  Basic metabolic panel     Status: Abnormal   Collection Time: 05/21/19  2:55 AM  Result Value Ref Range   Sodium 140 135 - 145 mmol/L   Potassium 3.6 3.5 - 5.1 mmol/L   Chloride 101 98 - 111 mmol/L   CO2 26 22 - 32 mmol/L   Glucose, Bld 78 70 - 99 mg/dL   BUN 22 8 - 23 mg/dL   Creatinine, Ser 1.29 (H) 0.61 - 1.24 mg/dL   Calcium 8.4 (L) 8.9 - 10.3 mg/dL   GFR calc non Af Amer 55 (L) >60 mL/min   GFR calc Af Amer >60 >60 mL/min   Anion gap 13 5 - 15    Comment: Performed at [facility] [address], [place], [place] [address]    Radiology/Results: DG Abd 2 Views  Result Date: 05/20/2019 CLINICAL DATA:  74 year old male status  post tube placement. EXAM: ABDOMEN - 2 VIEW COMPARISON:  Earlier radiograph dated 05/20/2019. FINDINGS: Partially visualized enteric tube with side port and tip in the left upper abdomen, likely in the proximal stomach. Mildly dilated loops of small bowel containing diluted contrast again noted. There appears to be new contrast in the cecum likely representing contrast traversing into the colon. Continued follow-up recommended. No other interval change since the earlier radiograph. IMPRESSION: 1. Enteric tube in the proximal stomach. 2. Probable progression of contrast into the cecum. Continued follow-up recommended. Electronically Signed   By: Anner Crete M.D.   On: 05/20/2019 18:41   DG Abd Acute W/Chest  Result Date: 05/19/2019 CLINICAL DATA:  Chronic 8 month history of midline abdominal pain associated with nausea and vomiting. Prior thoracic and abdominal aortic stent grafting. EXAM:  DG ABDOMEN ACUTE W/ 1V CHEST COMPARISON:  CT angio chest abdomen and pelvis 05/07/2019. Chest x-ray 02/01/2018. FINDINGS: Mild gaseous distension of adjacent loops of jejunum in the LEFT UPPER QUADRANT demonstrating air-fluid levels on the ERECT image. No free intraperitoneal air. Expected stool burden and residual contrast material throughout the normal caliber and decompressed colon, with numerous diverticula involving the sigmoid colon; it is unclear where the contrast material originated, as it was not given for the recent CTA and there are no interval imaging studies since then. Thoracic and abdominal aortic stent grafting with stenting of the renal arteries. Cardiac silhouette mildly enlarged, unchanged. The opacity in the superior segment RIGHT LOWER LOBE identified on the recent CTA has improved is visible though much less conspicuous on the chest x-ray. Linear scarring is present in the RIGHT LOWER LOBE in a vertical orientation, unchanged. New linear atelectasis at the LEFT lung base. IMPRESSION: 1. Partial small  bowel obstruction involving the jejunum in the LEFT UPPER QUADRANT. No free intraperitoneal air. 2. Diverticulosis involving the sigmoid colon. 3. The superior segment RIGHT LOWER LOBE opacity identified on the recent CTA is still present though much less conspicuous on chest x-ray. The recommendation for a follow-up CT in 3 months, early May, 2021, remains. 4. New atelectasis involving the LEFT lung base. No acute cardiopulmonary disease otherwise. Electronically Signed   By: Evangeline Dakin M.D.   On: 05/19/2019 13:37   DG Abd Portable 1V-Small Bowel Obstruction Protocol-initial, 8 hr delay  Result Date: 05/20/2019 CLINICAL DATA:  Small-bowel obstruction. Image obtained 8 hours after injection of contrast through the patient's nasogastric tube. EXAM: PORTABLE ABDOMEN - 1 VIEW COMPARISON:  Yesterday. Chest, abdomen and pelvis CTA obtained yesterday. FINDINGS: Interval dilute contrast in multiple mildly dilated loops of small bowel. No contrast in the distal small bowel. Previously demonstrated contrast in the colon and urinary bladder. Stable vascular stents and hernia repair mesh. The nasogastric tube has been advanced with its tip and side hole in the proximal stomach. Cholecystectomy clips and single right lower quadrant abdominal surgical clip. Moderate levoconvex lumbar rotary scoliosis and degenerative changes. IMPRESSION: Interval dilute contrast in multiple mildly dilated loops of small bowel. This is compatible with partial small bowel obstruction, without significant change. The contrast has not progressed into the distal small bowel at this point. A repeat image could be obtained in 8 hours to evaluate for distal progression. Electronically Signed   By: Claudie Revering M.D.   On: 05/20/2019 09:40   DG Abd Portable 1V-Small Bowel Protocol-Position Verification  Result Date: 05/19/2019 CLINICAL DATA:  NG tube placement EXAM: PORTABLE ABDOMEN - 1 VIEW COMPARISON:  None. FINDINGS: The distal tip of the  NG tube is in the left upper quadrant, likely within the gastric fundus. The side port appears to be just above the GE junction. Contrast in the bladder and colon. Vascular stents identified. No evidence of bowel obstruction. IMPRESSION: 1. The distal tip of the NG tube is in the stomach with the side port is just above the GE junction. Recommend advancing 4-6 cm. 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 Patient presents with abdominal pain. EXAM: CT ANGIOGRAPHY CHEST, ABDOMEN AND PELVIS TECHNIQUE: Multidetector CT imaging through the chest, abdomen and pelvis was  performed using the standard protocol during bolus administration of intravenous contrast. Multiplanar reconstructed images and MIPs were obtained and reviewed to evaluate the vascular anatomy. CONTRAST:  122mL OMNIPAQUE IOHEXOL 350 MG/ML SOLN COMPARISON:  05/07/2019 CT angiogram of the chest, abdomen and pelvis. FINDINGS: CTA CHEST FINDINGS Cardiovascular: Normal heart size. No significant pericardial effusion/thickening. Left anterior descending and right coronary atherosclerosis. Atherosclerotic thoracic aorta. Long segment thoracoabdominal aortic aneurysm extending from the mid descending thoracic aorta into the infrarenal abdominal aorta status post stent graft repair, with interval placement of stent grafts in entire descending thoracic aorta with previous stent graft in the abdominal aorta. Descending thoracic aorta is tortuous. The lower descending thoracic aortic aneurysm sac measures 8.1 cm maximum diameter (series 11/image 87), compared to 8.3 cm on 05/07/2019, slightly decreased. Tiny focus of nonspecific gas within descending thoracic aortic aneurysm sac. No  acute intramural hematoma or acute dissection in the thoracic aorta. Aortic arch branch vessels are patent. No appreciable endoleak in the thoracic aorta. Normal caliber main pulmonary artery. No central pulmonary emboli. Mediastinum/Nodes: No discrete thyroid nodules. Unremarkable esophagus. No pathologically enlarged axillary, mediastinal or hilar lymph nodes. Lungs/Pleura: No pneumothorax. Small dependent bilateral pleural effusions. Mild centrilobular and paraseptal emphysema. Stable 4 mm basilar right upper lobe solid pulmonary nodule along the minor fissure (series 5/image 34). Sub solid 4.6 x 2.5 cm masslike opacity in the superior segment right lower lobe (series 5/image 29) with 2.4 cm solid component, unchanged since 05/07/2019 chest CT using similar measurement technique. Passive atelectasis in the left greater than right lower lobes. No new significant pulmonary nodules. Musculoskeletal: No aggressive appearing focal osseous lesions. Mild thoracic spondylosis. Review of the MIP images confirms the above findings. CTA ABDOMEN AND PELVIS FINDINGS VASCULAR Aorta: Prior abdominal aortic stent graft repair long segment suprarenal and infrarenal abdominal aortic aneurysm, with maximum abdominal aortic aneurysm sac diameter measuring 6.7 cm in the suprarenal abdominal aorta (series 11/image 81), unchanged. No evidence of an endoleak in abdominal aorta. Prior aorto bi-iliac surgical graft repair, which appears patent. Celiac: Stable stent within the celiac trunk, which appears patent. SMA: Stable proximal SMA stent, which demonstrates approximately 50% luminal narrowing at the origin (series 14/image 119), slightly worsened since 05/07/2019. SMA patent distal to the SMA stent with no thrombosis. Renals: Stable appearance of the bilateral proximal renal artery stents, which appear patent without high-grade stenosis. IMA: Chronic occlusion. Inflow: No high-grade iliac graft or common femoral artery stenoses.  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 Veins: No obvious venous abnormality within the limitations of this arterial phase study. Review of the MIP images confirms the above findings. NON-VASCULAR Hepatobiliary: Normal liver size. Stable subcentimeter hypodense anterior liver lesion, too small to characterize. No new liver lesions. Cholecystectomy. No biliary ductal dilatation. Pancreas: Normal, with no mass or duct dilation. Spleen: Normal size. No mass. Adrenals/Urinary Tract: Normal adrenals. Stable scattered subcentimeter hypodense renal cortical lesions, too small to characterize, requiring no follow-up. No new renal lesions. No hydronephrosis. Normal bladder. Stomach/Bowel: Normal non-distended stomach. Several mildly dilated small bowel loops throughout the abdomen up to 3.1 cm diameter, with air-fluid levels. No definite small bowel wall thickening or pneumatosis. Distal small bowel is collapsed. No discrete small bowel caliber transition identified. Appendix not discretely visualized. Mild sigmoid diverticulosis, with no large bowel wall thickening or significant pericolonic fat stranding. Vascular/Lymphatic: No pathologically enlarged lymph nodes in the abdomen or pelvis. Reproductive: Mild prostatomegaly. Nonspecific internal prostatic calcifications. Other: No pneumoperitoneum. Small volume ascites, mildly increased from prior.  No focal fluid collections. Musculoskeletal: No aggressive appearing focal osseous lesions. Moderate thoracic spondylosis. Review of the MIP images confirms the above findings. IMPRESSION: 1. Mildly dilated small bowel loops throughout the abdomen with collapsed distal small bowel. Findings are concerning for mild mechanical distal small bowel obstruction, although no discrete caliber transition is identified. Differential includes adynamic ileus. No free air. 2. Subsolid 4.6 cm superior segment right lower lobe masslike opacity with 2.4 cm solid component, persistent  and unchanged since recent CT. Primary bronchogenic carcinoma not excluded in this high risk patient with smoking related changes in the lungs. Outpatient PET-CT suggested for further evaluation when clinically feasible. 3. 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 No evidence of an endoleak. The lower descending thoracic aortic aneurysm sac measures 8.1 cm maximum diameter, slightly decreased. Tiny focus of nonspecific gas within the descending thoracic aortic aneurysm sac, presumably due to recent intervention. 4. Narrowing of the SMA stent at the origin, approximately 50%, appearing slightly worsened. SMA remains patent. 5. Small dependent bilateral pleural effusions. Passive atelectasis in the left greater than right lower lobes. 6. Aortic Atherosclerosis (ICD10-I70.0) and Emphysema (ICD10-J43.9). Electronically Signed   By: Ilona Sorrel M.D.   On: 05/19/2019 15:47    Anti-infectives: Anti-infectives (From admission, onward)   None      Assessment/Plan: Problem List: Patient Active Problem List   Diagnosis Date Noted  . SBO (small bowel obstruction) ([place]) 05/19/2019  . Hemochromatosis   . Tobacco abuse   . Coronary artery disease involving native coronary artery of native heart with angina pectoris ([place]) 01/31/2018  . Essential hypertension 01/31/2018  . Hyperlipidemia with target LDL less than 70 01/31/2018  . Acute ST elevation myocardial infarction (STEMI) involving left anterior descending (LAD) coronary artery ([place]) 01/31/2018  . Acute combined systolic and diastolic heart failure ([place]) 01/31/2018  . Ischemic cardiomyopathy 01/31/2018  . History of abdominal aortic aneurysm (AAA) repair 2008  . 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  - *  No surgery found *    LOS: 2 days   Matt B. Hassell Done, MD, [facility] [phone] beeper [phone]  05/21/2019 9:09 AM

## 2019-05-21 NOTE — Plan of Care (Signed)

## 2019-05-22 ENCOUNTER — Inpatient Hospital Stay (HOSPITAL_COMMUNITY): Payer: Medicare HMO

## 2019-05-22 LAB — COMPREHENSIVE METABOLIC PANEL
ALT: 10 U/L (ref 0–44)
ALT: 10 U/L (ref 0–44)
AST: 13 U/L — ABNORMAL LOW (ref 15–41)
AST: 12 U/L — ABNORMAL LOW (ref 15–41)
Albumin: 2.3 g/dL — ABNORMAL LOW (ref 3.5–5.0)
Albumin: 2.1 g/dL — ABNORMAL LOW (ref 3.5–5.0)
Alkaline Phosphatase: 65 U/L (ref 38–126)
Alkaline Phosphatase: 66 U/L (ref 38–126)
Anion gap: 14 (ref 5–15)
Anion gap: 10 (ref 5–15)
BUN: 21 mg/dL (ref 8–23)
BUN: 20 mg/dL (ref 8–23)
CO2: 25 mmol/L (ref 22–32)
CO2: 28 mmol/L (ref 22–32)
Calcium: 8.5 mg/dL — ABNORMAL LOW (ref 8.9–10.3)
Calcium: 8.2 mg/dL — ABNORMAL LOW (ref 8.9–10.3)
Chloride: 102 mmol/L (ref 98–111)
Chloride: 98 mmol/L (ref 98–111)
Creatinine, Ser: 1.24 mg/dL (ref 0.61–1.24)
Creatinine, Ser: 1.17 mg/dL (ref 0.61–1.24)
GFR calc Af Amer: >60 mL/min (ref >60)
GFR calc Af Amer: >60 mL/min (ref >60)
GFR calc non Af Amer: 58 mL/min — ABNORMAL LOW (ref >60)
GFR calc non Af Amer: >60 mL/min (ref >60)
Glucose, Bld: 62 mg/dL — ABNORMAL LOW (ref 70–99)
Glucose, Bld: 85 mg/dL (ref 70–99)
Potassium: 3.8 mmol/L (ref 3.5–5.1)
Potassium: 3.5 mmol/L (ref 3.5–5.1)
Sodium: 141 mmol/L (ref 135–145)
Sodium: 136 mmol/L (ref 135–145)
Total Bilirubin: 1.4 mg/dL — ABNORMAL HIGH (ref 0.3–1.2)
Total Bilirubin: 0.6 mg/dL (ref 0.3–1.2)
Total Protein: 4.8 g/dL — ABNORMAL LOW (ref 6.5–8.1)
Total Protein: 4.5 g/dL — ABNORMAL LOW (ref 6.5–8.1)

## 2019-05-22 LAB — CBC
HCT: 29.3 % — ABNORMAL LOW (ref 39.0–52.0)
Hemoglobin: 9.3 g/dL — ABNORMAL LOW (ref 13.0–17.0)
MCH: 29 pg (ref 26.0–34.0)
MCHC: 31.7 g/dL (ref 30.0–36.0)
MCV: 91.3 fL (ref 80.0–100.0)
Platelets: 456 10*3/uL — ABNORMAL HIGH (ref 150–400)
RBC: 3.21 MIL/uL — ABNORMAL LOW (ref 4.22–5.81)
RDW: 15.5 % (ref 11.5–15.5)
WBC: 6.2 10*3/uL (ref 4.0–10.5)
nRBC: 0 % (ref 0.0–0.2)

## 2019-05-22 MED ORDER — ENSURE ENLIVE PO LIQD: 237.0000 mL | Freq: Three times a day (TID) | ORAL | Status: DC

## 2019-05-22 MED ORDER — ORAL CARE MOUTH RINSE
15.0000 mL | Freq: Two times a day (BID) | OROMUCOSAL | Status: DC
  Administered 2019-05-23: 11:00:00 15 mL via OROMUCOSAL

## 2019-05-22 MED ORDER — ENSURE ENLIVE PO LIQD
237.0000 mL | Freq: Two times a day (BID) | ORAL | Status: DC
  Administered 2019-05-22: 10:00:00 237 mL via ORAL

## 2019-05-22 MED ORDER — ACETAMINOPHEN 325 MG PO TABS
650.0000 mg | ORAL_TABLET | Freq: Four times a day (QID) | ORAL | Status: DC | PRN
  Administered 2019-05-22: 22:00:00 650 mg via ORAL
  Filled 2019-05-22: qty 2

## 2019-05-22 NOTE — Plan of Care (Signed)
  Problem: Education: Goal: Knowledge of General Education information will improve Description Including pain rating scale, medication(s)/side effects and non-pharmacologic comfort measures Outcome: Progressing   

## 2019-05-22 NOTE — Progress Notes (Addendum)
PROGRESS NOTE    Patrick Adams  MLY:[id number]  DOB: 1945-10-22  DOA: 05/19/2019 PCP: [name], MD Outpatient Specialists:   Hospital course: 74 year old man with hemochromatosis, CAD, ischemic CM, status post endovascular AAA repair at [facility] last week was admitted to 05/19/2019 with ileus versus SBO.  His postop course at [facility] was also complicated by ileus which was treated conservatively.  Patient was seen by general surgery who recommended conservative therapy with NG tube and IV hydration.  Of note patient states he has not had a bowel movement since November.  NG tube seems to be draining feculent material.  Bowel regimen with enemas started 05/20/2019.  Subjective:  Patient states he feels little better this morning.  He continues to have bowel movements and states these remain loose. He is tolerating liquids with NG tube clamped. He is hesitant to have the NG tube removed because he does not want it to have to be replaced. He does continue to have mild abdominal pain although much improved from previous. He denies any  CP, N/V/D, fever, chills.   Objective: Vitals:   05/21/19 0457 05/21/19 1502 05/21/19 2124 05/22/19 0402  BP: (!) 146/73 (!) 153/73 (!) 149/59 135/83  Pulse: 75 63 72 67  Resp: 17 18  16   Temp:  97.6 F (36.4 C) 98.3 F (36.8 C) 98.6 F (37 C)  TempSrc: Oral Axillary Oral Oral  SpO2: 95% 100% 98% 98%    Intake/Output Summary (Last 24 hours) at 05/22/2019 1448 Last data filed at 05/22/2019 1116 Gross per 24 hour  Intake 2736.86 ml  Output 1650 ml  Net 1086.86 ml   There were no vitals filed for this visit.   Assessment & Plan:   74 year old man presents with SBO 2 days after discharge from [facility] status post endovascular repair of AAA leak.  Postop course at [facility] was complicated by ileus which was treated conservatively.  Patient now presents with likely recurrent ileus.  NG tube is draining feculent material.  Patient is responding to smog  enemas.  SBO Patient had 1900 ml of NG output yesterday however ~ 960 ml of this was likely his SMOG enema.  NGT clamped. Hopeful to remove tomorrow if abdominal pain resolves and tolerating diet. Ensure TID Will decrease IVF to 50 ml/h of 1/2NS with 20 meq K since patient will begin attempting liquids.   Abdominal exam is softer and no longer tender.Seen by general surgery today who recommend GI evaluation for possible stricture. GI evaluated imaging yesterday and with no evidence of stricture along with improved output states he has no new recommendations at this time. If patient develops new or worsening abdominal pain or requires NGT suction will consider re-consult to GI Surgery following and appreciate their assistance  KUB reviewed with no sign of SBO at this time.   Constipation Patient had a large bowel movement to smog enema yesterday and the day before. He continues to have BMs today.   Patient was seen by Dr. Carlean Purl of  [facility] as an outpatient the day before admission, Amitiza was prescribed but outpatient Amitiza declined by insurance. Continue Amitiza in house.  Patient does have hemochromatosis and may well have slow/sluggish gut from that.  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 Discussed with patient, he states that his PCP in [place] has been following something in his lung for several years which has not changed. PET scan had  been recommended as an outpatient. We will try to contact PCP tomorrow to see if further work-up is warranted here or this can be done as an outpatient.  Hemochromatosis Patient was receiving routine phlebotomies until 2 years ago.   Can consider consulting heme-onc for possible iron overload  Anemia Mild. No signs of acute blood loss Repeat CBC in AM  Hyponatremia Resolved Repeat labs in AM  S/p AAA repair No evidence for recurrent leak today  status post endovascular repair at [facility] last week.  HTN Continue losartan  CAD No chest pain or evidence of ACS. Continue aspirin and Plavix.  CHF No evidence for acute decompensation. Need to be careful with IV fluids. Patient does not appear to be on a beta-blocker as an outpatient. Continue losartan.   DVT prophylaxis: Lovenox Code Status: Full Family Communication: Spoke with patient at bedside disposition Plan: Home   Consultants:  General surgery  [facility]  Procedures:  NG tube placement  Antimicrobials:  None   Exam:  General: No apparent distress with NG tube in place currently clamped Eyes: sclera anicteric, conjuctiva mild injection bilaterally CVS: RRR x 4  Respiratory:  normal effort, symmetrical excursion, CTA without adventitious sounds.  GI: NABS, soft, no tenderness in the hypogastric region.  No rebound.  Nondistended.  Soft.   LE: No edema. No cyanosis Neuro: A/O x 3, Moving all extremities equally with normal strength, CN 3-12 intact, grossly nonfocal.  Psych: patient is logical and coherent, judgement and insight appear normal, mood and affect appropriate to situation.   Data Reviewed: Basic Metabolic Panel: Recent Labs  Lab 05/19/19 1325 05/20/19 0220 05/21/19 0255 05/22/19 0226  NA 133* 135 140 141  K 4.4 4.1 3.6 3.8  CL 103 99 101 102  CO2 17* 21* 26 25  GLUCOSE 127* 90 78 62*  BUN 20 19 22 21   CREATININE 1.33* 1.30* 1.29* 1.24  CALCIUM 9.0 8.9 8.4* 8.5*  MG  --   --  1.9  --   PHOS  --   --  3.9  --    Liver Function Tests: Recent Labs  Lab 05/19/19 1325 05/20/19 0220 05/22/19 0226  AST 16 14* 13*  ALT 12 11 10   ALKPHOS 81 82 65  BILITOT 0.7 1.0 1.4*  PROT 6.1* 5.7* 4.8*  ALBUMIN 2.8* 2.6* 2.3*   Recent Labs  Lab 05/19/19 1325  LIPASE 20   No results for input(s): AMMONIA in the last 168 hours. CBC: Recent Labs  Lab 05/19/19 1325 05/20/19 0220 05/21/19 0255 05/22/19 0226  WBC 15.9* 9.8 7.2 6.2   NEUTROABS 14.6*  --   --   --   HGB 12.3* 11.4* 10.1* 9.3*  HCT 40.2 34.3* 31.2* 29.3*  MCV 95.3 87.3 89.9 91.3  PLT 409* 458* 442* 456*   Cardiac Enzymes: No results for input(s): CKTOTAL, CKMB, CKMBINDEX, TROPONINI in the last 168 hours. BNP (last 3 results) No results for input(s): PROBNP in the last 8760 hours. CBG: Recent Labs  Lab 05/19/19 1333  GLUCAP 122*    Recent Results (from the past 240 hour(s))  Culture, blood (routine x 2)     Status: None (Preliminary result)   Collection Time: 05/19/19  3:50 PM   Specimen: BLOOD  Result Value Ref Range Status   Specimen Description BLOOD SITE NOT SPECIFIED  Final   Special Requests   Final    BOTTLES DRAWN AEROBIC ONLY Blood Culture results may not be optimal due to an inadequate volume  of blood received in culture bottles   Culture   Final    NO GROWTH 3 DAYS Performed at [facility], [place] [address] [place], [place] [address]    Report Status PENDING  Incomplete  Culture, blood (routine x 2)     Status: None (Preliminary result)   Collection Time: 05/19/19  4:00 PM   Specimen: BLOOD RIGHT ARM  Result Value Ref Range Status   Specimen Description BLOOD RIGHT ARM  Final   Special Requests   Final    BOTTLES DRAWN AEROBIC AND ANAEROBIC Blood Culture adequate volume   Culture   Final    NO GROWTH 3 DAYS Performed at [facility], [place] [address] [place], [place] [address]    Report Status PENDING  Incomplete  SARS CORONAVIRUS 2 (TAT 6-24 HRS) Nasopharyngeal Nasopharyngeal Swab     Status: None   Collection Time: 05/19/19  4:32 PM   Specimen: Nasopharyngeal Swab  Result Value Ref Range Status   SARS Coronavirus 2 NEGATIVE NEGATIVE Final    Comment: (NOTE) SARS-CoV-2 target nucleic acids are NOT DETECTED. The SARS-CoV-2 RNA is generally detectable in upper and lower respiratory specimens during the acute phase of infection. Negative results do not preclude SARS-CoV-2 infection, do not rule  out co-infections with other pathogens, and should not be used as the sole basis for treatment or other patient management decisions. Negative results must be combined with clinical observations, patient history, and epidemiological information. The expected result is Negative. Fact Sheet for Patients: SugarRoll.be Fact Sheet for Healthcare Providers: https://www.woods-mathews.com/ This test is not yet approved or cleared by the [place] FDA and  has been authorized for detection and/or diagnosis of SARS-CoV-2 by FDA under an Emergency Use Authorization (EUA). This EUA will remain  in effect (meaning this test can be used) for the duration of the COVID-19 declaration under Section 56 4(b)(1) of the Act, 21 U.S.C. section 360bbb-3(b)(1), unless the authorization is terminated or revoked sooner. Performed at [facility], [place] [address] [place], [place] [address]       Studies: DG Abd 2 Views  Result Date: 05/22/2019 CLINICAL DATA:  SBO EXAM: ABDOMEN - 2 VIEW COMPARISON:  CTA abdomen/pelvis dated 05/19/2019. FINDINGS: Nonobstructive bowel gas pattern. No evidence of free air under the diaphragm on the upright view. Thoracoabdominal aortic stent.  Renal artery stents. Prior ventral hernia mesh repair.  Cholecystectomy clips. 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 IMPRESSION: No evidence of small bowel obstruction or free air. Additional stable ancillary findings as above. Electronically Signed   By: Julian Hy M.D.   On: 05/22/2019 09:36   DG Abd 2 Views  Result Date: 05/20/2019 CLINICAL DATA:  74 year old male status post tube placement. EXAM: ABDOMEN - 2 VIEW COMPARISON:  Earlier radiograph dated 05/20/2019. FINDINGS: Partially visualized enteric tube with side port and tip in the left upper abdomen, likely in the proximal stomach. Mildly dilated loops of small bowel containing diluted contrast again noted.  There appears to be new contrast in the cecum likely representing contrast traversing into the colon. Continued follow-up recommended. No other interval change since the earlier radiograph. IMPRESSION: 1. Enteric tube in the proximal stomach. 2. Probable progression of contrast into the cecum. Continued follow-up recommended. Electronically Signed   By: Anner Crete M.D.   On: 05/20/2019 18:41     Scheduled Meds: . aspirin EC  81 mg Oral QHS  . bisacodyl  10 mg Rectal Once  .  clopidogrel  75 mg Oral QHS  . enoxaparin (LOVENOX) injection  40 mg Subcutaneous Q24H  . feeding supplement (ENSURE ENLIVE)  237 mL Oral BID BM  . losartan  25 mg Oral QHS  . lubiprostone  24 mcg Oral BID WC   Continuous Infusions: . 0.45 % NaCl with KCl 20 mEq / L 75 mL/hr at 05/22/19 1437  . lactated ringers Stopped (05/22/19 0656)    Principal Problem:   SBO (small bowel obstruction) (HCC) Active Problems:   Essential hypertension   Acute combined systolic and diastolic heart failure (HCC)   Ischemic cardiomyopathy   History of abdominal aortic aneurysm (AAA) repair   Hemochromatosis     Patrick Organ, DO [facility]  05/22/2019, 2:48 PM    LOS: 3 days

## 2019-05-22 NOTE — Progress Notes (Signed)
Patient ID: Patrick Adams, male   DOB: 1945-07-24, 74 y.o.   MRN: [id number] [facility] Surgery Progress Note:   * No surgery found *  Subjective: Mental status is clear;  He is hungry and passing stool and gas Objective: Vital signs in last 24 hours: Temp:  [97.6 F (36.4 C)-98.6 F (37 C)] 98.6 F (37 C) (02/21 0402) Pulse Rate:  [63-72] 67 (02/21 0402) Resp:  [16-18] 16 (02/21 0402) BP: (135-153)/(59-83) 135/83 (02/21 0402) SpO2:  [98 %-100 %] 98 % (02/21 0402)  Intake/Output from previous day: 02/20 0701 - 02/21 0700 In: 2556.9 [P.O.:300; I.V.:2256.9] Out: 2200 [Urine:300; Emesis/NG output:1900] Intake/Output this shift: Total I/O In: -  Out: 350 [Urine:350]  Physical Exam: Work of breathing is not labored.  NG volume expanded because of the ice chips that he is consuming.    Lab Results:  Results for orders placed or performed during the hospital encounter of 05/19/19 (from the past 48 hour(s))  CBC     Status: Abnormal   Collection Time: 05/21/19  2:55 AM  Result Value Ref Range   WBC 7.2 4.0 - 10.5 K/uL   RBC 3.47 (L) 4.22 - 5.81 MIL/uL   Hemoglobin 10.1 (L) 13.0 - 17.0 g/dL   HCT 31.2 (L) 39.0 - 52.0 %   MCV 89.9 80.0 - 100.0 fL   MCH 29.1 26.0 - 34.0 pg   MCHC 32.4 30.0 - 36.0 g/dL   RDW 15.7 (H) 11.5 - 15.5 %   Platelets 442 (H) 150 - 400 K/uL   nRBC 0.0 0.0 - 0.2 %    Comment: Performed at [facility], [address] [place], [place] [address]  Basic metabolic panel     Status: Abnormal   Collection Time: 05/21/19  2:55 AM  Result Value Ref Range   Sodium 140 135 - 145 mmol/L   Potassium 3.6 3.5 - 5.1 mmol/L   Chloride 101 98 - 111 mmol/L   CO2 26 22 - 32 mmol/L   Glucose, Bld 78 70 - 99 mg/dL   BUN 22 8 - 23 mg/dL   Creatinine, Ser 1.29 (H) 0.61 - 1.24 mg/dL   Calcium 8.4 (L) 8.9 - 10.3 mg/dL   GFR calc non Af Amer 55 (L) >60 mL/min   GFR calc Af Amer >60 >60 mL/min   Anion gap 13 5 - 15    Comment: Performed at [facility] [address] [place], [place] [address]  Magnesium     Status: None   Collection Time: 05/21/19  2:55 AM  Result Value Ref Range   Magnesium 1.9 1.7 - 2.4 mg/dL    Comment: Performed at [facility] [address] [place], [place] [address]  Phosphorus     Status: None   Collection Time: 05/21/19  2:55 AM  Result Value Ref Range   Phosphorus 3.9 2.5 - 4.6 mg/dL    Comment: Performed at [facility] [address], [place], [place] [address]  Comprehensive metabolic panel     Status: Abnormal   Collection Time: 05/22/19  2:26 AM  Result Value Ref Range   Sodium 141 135 - 145 mmol/L   Potassium 3.8 3.5 - 5.1 mmol/L   Chloride 102 98 - 111 mmol/L   CO2 25 22 - 32 mmol/L   Glucose, Bld 62 (L) 70 - 99 mg/dL   BUN 21 8 - 23 mg/dL   Creatinine, Ser 1.24 0.61 - 1.24  mg/dL   Calcium 8.5 (L) 8.9 - 10.3 mg/dL   Total Protein 4.8 (L) 6.5 - 8.1 g/dL   Albumin 2.3 (L) 3.5 - 5.0 g/dL   AST 13 (L) 15 - 41 U/L   ALT 10 0 - 44 U/L   Alkaline Phosphatase 65 38 - 126 U/L   Total Bilirubin 1.4 (H) 0.3 - 1.2 mg/dL   GFR calc non Af Amer 58 (L) >60 mL/min   GFR calc Af Amer >60 >60 mL/min   Anion gap 14 5 - 15    Comment: Performed at [facility] [address], [place], [place] [address]  CBC     Status: Abnormal   Collection Time: 05/22/19  2:26 AM  Result Value Ref Range   WBC 6.2 4.0 - 10.5 K/uL   RBC 3.21 (L) 4.22 - 5.81 MIL/uL   Hemoglobin 9.3 (L) 13.0 - 17.0 g/dL   HCT 29.3 (L) 39.0 - 52.0 %   MCV 91.3 80.0 - 100.0 fL   MCH 29.0 26.0 - 34.0 pg   MCHC 31.7 30.0 - 36.0 g/dL   RDW 15.5 11.5 - 15.5 %   Platelets 456 (H) 150 - 400 K/uL   nRBC 0.0 0.0 - 0.2 %    Comment: Performed at [facility], [place] [address], [place], [place] [address]    Radiology/Results: DG Abd 2 Views  Result Date: 05/20/2019 CLINICAL DATA:  74 year old male status post tube placement. EXAM: ABDOMEN - 2 VIEW COMPARISON:  Earlier radiograph dated 05/20/2019. FINDINGS: Partially  visualized enteric tube with side port and tip in the left upper abdomen, likely in the proximal stomach. Mildly dilated loops of small bowel containing diluted contrast again noted. There appears to be new contrast in the cecum likely representing contrast traversing into the colon. Continued follow-up recommended. No other interval change since the earlier radiograph. IMPRESSION: 1. Enteric tube in the proximal stomach. 2. Probable progression of contrast into the cecum. Continued follow-up recommended. Electronically Signed   By: Anner Crete M.D.   On: 05/20/2019 18:41   DG Abd Portable 1V-Small Bowel Obstruction Protocol-initial, 8 hr delay  Result Date: 05/20/2019 CLINICAL DATA:  Small-bowel obstruction. Image obtained 8 hours after injection of contrast through the patient's nasogastric tube. EXAM: PORTABLE ABDOMEN - 1 VIEW COMPARISON:  Yesterday. Chest, abdomen and pelvis CTA obtained yesterday. FINDINGS: Interval dilute contrast in multiple mildly dilated loops of small bowel. No contrast in the distal small bowel. Previously demonstrated contrast in the colon and urinary bladder. Stable vascular stents and hernia repair mesh. The nasogastric tube has been advanced with its tip and side hole in the proximal stomach. Cholecystectomy clips and single right lower quadrant abdominal surgical clip. Moderate levoconvex lumbar rotary scoliosis and degenerative changes. IMPRESSION: Interval dilute contrast in multiple mildly dilated loops of small bowel. This is compatible with partial small bowel obstruction, without significant change. The contrast has not progressed into the distal small bowel at this point. A repeat image could be obtained in 8 hours to evaluate for distal progression. Electronically Signed   By: Claudie Revering M.D.   On: 05/20/2019 09:40    Anti-infectives: Anti-infectives (From admission, onward)   None      Assessment/Plan: Problem List: Patient Active Problem List    Diagnosis Date Noted  . SBO (small bowel obstruction) ([place]) 05/19/2019  . Hemochromatosis   . Tobacco abuse   . Coronary artery disease involving native coronary artery of native heart with angina pectoris (  [place]) 01/31/2018  . Essential hypertension 01/31/2018  . Hyperlipidemia with target LDL less than 70 01/31/2018  . Acute ST elevation myocardial infarction (STEMI) involving left anterior descending (LAD) coronary artery ([place]) 01/31/2018  . Acute combined systolic and diastolic heart failure ([place]) 01/31/2018  . Ischemic cardiomyopathy 01/31/2018  . History of abdominal aortic aneurysm (AAA) repair 2008  . 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   * No surgery found *    LOS: 3 days   Matt B. Hassell Done, MD, [facility] [phone] beeper [phone]  05/22/2019 9:25 AM

## 2019-05-23 ENCOUNTER — Ambulatory Visit: Payer: Medicare HMO | Admitting: Gastroenterology

## 2019-05-23 DIAGNOSIS — I509 Heart failure, unspecified: Secondary | ICD-10-CM

## 2019-05-23 LAB — BASIC METABOLIC PANEL
Anion gap: 12 (ref 5–15)
BUN: 16 mg/dL (ref 8–23)
CO2: 25 mmol/L (ref 22–32)
Calcium: 7.9 mg/dL — ABNORMAL LOW (ref 8.9–10.3)
Chloride: 95 mmol/L — ABNORMAL LOW (ref 98–111)
Creatinine, Ser: 1.09 mg/dL (ref 0.61–1.24)
GFR calc Af Amer: >60 mL/min (ref >60)
GFR calc non Af Amer: >60 mL/min (ref >60)
Glucose, Bld: 66 mg/dL — ABNORMAL LOW (ref 70–99)
Potassium: 3.6 mmol/L (ref 3.5–5.1)
Sodium: 132 mmol/L — ABNORMAL LOW (ref 135–145)

## 2019-05-23 LAB — CBC
HCT: 26.4 % — ABNORMAL LOW (ref 39.0–52.0)
Hemoglobin: 8.7 g/dL — ABNORMAL LOW (ref 13.0–17.0)
MCH: 29.3 pg (ref 26.0–34.0)
MCHC: 33 g/dL (ref 30.0–36.0)
MCV: 88.9 fL (ref 80.0–100.0)
Platelets: 457 10*3/uL — ABNORMAL HIGH (ref 150–400)
RBC: 2.97 MIL/uL — ABNORMAL LOW (ref 4.22–5.81)
RDW: 14.8 % (ref 11.5–15.5)
WBC: 5.2 10*3/uL (ref 4.0–10.5)
nRBC: 0 % (ref 0.0–0.2)

## 2019-05-23 LAB — CBC WITH DIFFERENTIAL/PLATELET
Abs Immature Granulocytes: 0.02 10*3/uL (ref 0.00–0.07)
Basophils Absolute: 0 10*3/uL (ref 0.0–0.1)
Basophils Relative: 0 %
Eosinophils Absolute: 0.2 10*3/uL (ref 0.0–0.5)
Eosinophils Relative: 4 %
HCT: 24.3 % — ABNORMAL LOW (ref 39.0–52.0)
Hemoglobin: 8 g/dL — ABNORMAL LOW (ref 13.0–17.0)
Immature Granulocytes: 0 %
Lymphocytes Relative: 16 %
Lymphs Abs: 0.8 10*3/uL (ref 0.7–4.0)
MCH: 29.3 pg (ref 26.0–34.0)
MCHC: 32.9 g/dL (ref 30.0–36.0)
MCV: 89 fL (ref 80.0–100.0)
Monocytes Absolute: 0.3 10*3/uL (ref 0.1–1.0)
Monocytes Relative: 6 %
Neutro Abs: 3.7 10*3/uL (ref 1.7–7.7)
Neutrophils Relative %: 74 %
Platelets: 403 10*3/uL — ABNORMAL HIGH (ref 150–400)
RBC: 2.73 MIL/uL — ABNORMAL LOW (ref 4.22–5.81)
RDW: 15 % (ref 11.5–15.5)
WBC: 5.2 10*3/uL (ref 4.0–10.5)
nRBC: 0 % (ref 0.0–0.2)

## 2019-05-23 MED ORDER — POLYETHYLENE GLYCOL 3350 17 G PO PACK
17.0000 g | PACK | Freq: Every day | ORAL | Status: DC
  Administered 2019-05-23 – 2019-05-24 (×2): 17 g via ORAL
  Filled 2019-05-23 (×2): qty 1

## 2019-05-23 MED ORDER — ENOXAPARIN SODIUM 40 MG/0.4ML ~~LOC~~ SOLN
40.0000 mg | SUBCUTANEOUS | Status: DC
  Administered 2019-05-23: 22:00:00 40 mg via SUBCUTANEOUS
  Filled 2019-05-23: qty 0.4

## 2019-05-23 NOTE — Progress Notes (Addendum)
Patrick Adams  WJX:[id number] DOB: 1945/04/04 DOA: 05/19/2019 PCP: [name], MD     Brief Narrative:  74 year old male with a past history of hemochromatosis, CAD, hypertension, and status post endovascular AAA repair at [facility] in early February.  He was admitted on 2/18 for possible postoperative ileus versus partial small bowel obstruction.  He did have what was suspected to be a postoperative ileus that responded to conservative treatment while at [facility].  Initially had IV hydration and NG tube placement.   New events last 24 hours / Subjective: Patient reports feeling better.  He is hungry.  He tolerated broth and Ensure.  The general surgery team hads advanced his diet to soft.  The NG tube is still in place but is clamped.  He is passing gas.  Bowel movements are less frequent, though he has not been eating much.  No pain, nausea, fevers, or vomiting.  Assessment & Plan:   Principal Problem:   SBO (small bowel obstruction) (HCC)  Improving  Advance diet  Appreciate GS recs  If he tolerates diet, will stop fluids  Zofran 4 mg prn  Active Problems:   Essential hypertension  Cont Cozaar 25 mg/d    Anemia  Reck'd CBC, up to 8.7, will monitor tomorrow AM    CAD  Cont ASA  Cont Plavix 75 mg/d    CHF (congestive heart failure) (HCC)             Chronic  Stop fluids if tolerating diet    Lung mass  PET outpatient    Ischemic cardiomyopathy   History of abdominal aortic aneurysm (AAA) repair   Hemochromatosis   DVT prophylaxis: Lovenox Code Status: Full Family Communication: Self Coming From: Home Disposition Plan: Home Barriers to Discharge: Clinical improvement  Consultants:  General surgery  Objective: Vitals:   05/22/19 0402 05/22/19 1517 05/22/19 2055 05/23/19 0518  BP: 135/83 (!) 154/71 (!) 145/74 137/74  Pulse: 67 60 66 (!) 56  Resp: 16 18 17 16   Temp: 98.6 F (37 C) 98.2 F (36.8 C) 98.7 F (37.1 C) 98.6 F (37 C)    TempSrc: Oral Oral Oral Oral  SpO2: 98% 97% 95% 96%    Intake/Output Summary (Last 24 hours) at 05/23/2019 1247 Last data filed at 05/23/2019 1043 Gross per 24 hour  Intake 2112.5 ml  Output 700 ml  Net 1412.5 ml   Examination:  General exam: Appears calm and comfortable  Respiratory system: Clear to auscultation. Respiratory effort normal. No respiratory distress. No conversational dyspnea.  Cardiovascular system: S1 & S2 heard, RRR. No murmurs.  Gastrointestinal system: Abdomen is nondistended, soft and mild ttp in lower quadrants. Normal bowel sounds heard. Central nervous system: Alert and oriented. No focal neurological deficits. Speech clear.  Extremities: Symmetric in appearance  Skin: No rashes, lesions or ulcers on exposed skin  Psychiatry: Judgement and insight appear normal. Mood & affect appropriate.   Data Reviewed: I have personally reviewed following labs and imaging studies  CBC: Recent Labs  Lab 05/19/19 1325 05/20/19 0220 05/21/19 0255 05/22/19 0226 05/23/19 0251  WBC 15.9* 9.8 7.2 6.2 5.2  NEUTROABS 14.6*  --   --   --  3.7  HGB 12.3* 11.4* 10.1* 9.3* 8.0*  HCT 40.2 34.3* 31.2* 29.3* 24.3*  MCV 95.3 87.3 89.9 91.3 89.0  PLT 409* 458* 442* 456* 213*   Basic Metabolic Panel: Recent Labs  Lab 05/20/19 0220 05/21/19 0255 05/22/19 0226 05/22/19 1620 05/23/19 0251  NA 135 140 141 136 132*  K 4.1 3.6 3.8 3.5 3.6  CL 99 101 102 98 95*  CO2 21* 26 25 28 25   GLUCOSE 90 78 62* 85 66*  BUN 19 22 21 20 16   CREATININE 1.30* 1.29* 1.24 1.17 1.09  CALCIUM 8.9 8.4* 8.5* 8.2* 7.9*  MG  --  1.9  --   --   --   PHOS  --  3.9  --   --   --    GFR: Estimated Creatinine Clearance: 57.4 mL/min (by C-G formula based on SCr of 1.09 mg/dL). Liver Function Tests: Recent Labs  Lab 05/19/19 1325 05/20/19 0220 05/22/19 0226 05/22/19 1620  AST 16 14* 13* 12*  ALT 12 11 10 10   ALKPHOS 81 82 65 66  BILITOT 0.7 1.0 1.4* 0.6  PROT 6.1* 5.7* 4.8* 4.5*  ALBUMIN  2.8* 2.6* 2.3* 2.1*   Recent Labs  Lab 05/19/19 1325  LIPASE 20   CBG: Recent Labs  Lab 05/19/19 1333  GLUCAP 122*   Sepsis Labs: Recent Labs  Lab 05/19/19 1622  LATICACIDVEN 0.6    Recent Results (from the past 240 hour(s))  Culture, blood (routine x 2)     Status: None (Preliminary result)   Collection Time: 05/19/19  3:50 PM   Specimen: BLOOD  Result Value Ref Range Status   Specimen Description BLOOD SITE NOT SPECIFIED  Final   Special Requests   Final    BOTTLES DRAWN AEROBIC ONLY Blood Culture results may not be optimal due to an inadequate volume of blood received in culture bottles   Culture   Final    NO GROWTH 4 DAYS Performed at [facility], [place] [address], [place], [place] [address]    Report Status PENDING  Incomplete  Culture, blood (routine x 2)     Status: None (Preliminary result)   Collection Time: 05/19/19  4:00 PM   Specimen: BLOOD RIGHT ARM  Result Value Ref Range Status   Specimen Description BLOOD RIGHT ARM  Final   Special Requests   Final    BOTTLES DRAWN AEROBIC AND ANAEROBIC Blood Culture adequate volume   Culture   Final    NO GROWTH 4 DAYS Performed at [facility], [place] [address], [place], [place] [address]    Report Status PENDING  Incomplete  SARS CORONAVIRUS 2 (TAT 6-24 HRS) Nasopharyngeal Nasopharyngeal Swab     Status: None   Collection Time: 05/19/19  4:32 PM   Specimen: Nasopharyngeal Swab  Result Value Ref Range Status   SARS Coronavirus 2 NEGATIVE NEGATIVE Final    Comment: (NOTE) SARS-CoV-2 target nucleic acids are NOT DETECTED. The SARS-CoV-2 RNA is generally detectable in upper and lower respiratory specimens during the acute phase of infection. Negative results do not preclude SARS-CoV-2 infection, do not rule out co-infections with other pathogens, and should not be used as the sole basis for treatment or other patient management decisions. Negative results must be combined with clinical  observations, patient history, and epidemiological information. The expected result is Negative. Fact Sheet for Patients: SugarRoll.be Fact Sheet for Healthcare Providers: https://www.woods-mathews.com/ This test is not yet approved or cleared by the [place] FDA and  has been authorized for detection and/or diagnosis of SARS-CoV-2 by FDA under an Emergency Use Authorization (EUA). This EUA will remain  in effect (meaning this test can be used) for the duration of the COVID-19 declaration under Section 56 4(b)(1) of the Act, 21 U.S.C. section  360bbb-3(b)(1), unless the authorization is terminated or revoked sooner. Performed at [facility], [place] [address], [place], [place] [address]       Radiology Studies: DG Abd 2 Views  Result Date: 05/22/2019 CLINICAL DATA:  SBO EXAM: ABDOMEN - 2 VIEW COMPARISON:  CTA abdomen/pelvis dated 05/19/2019. FINDINGS: Nonobstructive bowel gas pattern. No evidence of free air under the diaphragm on the upright view. Thoracoabdominal aortic stent.  Renal artery stents. Prior ventral hernia mesh repair.  Cholecystectomy clips. 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 IMPRESSION: No evidence of small bowel obstruction or free air. Additional stable ancillary findings as above. Electronically Signed   By: Julian Hy M.D.   On: 05/22/2019 09:36     Scheduled Meds:  aspirin EC  81 mg Oral QHS   clopidogrel  75 mg Oral QHS   enoxaparin (LOVENOX) injection  40 mg Subcutaneous Q24H   feeding supplement (ENSURE ENLIVE)  237 mL Oral BID BM   feeding supplement (ENSURE ENLIVE)  237 mL Oral TID BM   losartan  25 mg Oral QHS   lubiprostone  24 mcg Oral BID WC   mouth rinse  15 mL Mouth Rinse BID   Continuous Infusions:  0.45 % NaCl with KCl 20 mEq / L 50 mL/hr at 05/22/19 1652     LOS: 4 days    Time spent: 30 minutes   Shelda Pal, DO [facility] 05/23/2019,  12:47 PM   Available via Epic secure chat 7am-7pm After these hours, please refer to coverage provider listed on [email or web address]

## 2019-05-23 NOTE — Progress Notes (Signed)
[facility] Progress Note     Subjective: Patient reports he has tolerated broth/ensure with NGT clamped. Has not had any nausea, distention or worsened pain since NGT clamped. Says he does not have any pain at rest but is mildly sore when people press on his abdomen. He is having some bowel function. He is tired of all this and just wants to be able to eat normally. He is very nervous about NGT removal.   Review of Systems  Constitutional: Negative for chills and fever.  Respiratory: Negative for shortness of breath.   Cardiovascular: Negative for chest pain.  Gastrointestinal: Negative for abdominal pain, constipation, diarrhea, nausea and vomiting.  Genitourinary: Negative for dysuria, frequency and urgency.     Objective: Vital signs in last 24 hours: Temp:  [98.2 F (36.8 C)-98.7 F (37.1 C)] 98.6 F (37 C) (02/22 0518) Pulse Rate:  [56-66] 56 (02/22 0518) Resp:  [16-18] 16 (02/22 0518) BP: (137-154)/(71-74) 137/74 (02/22 0518) SpO2:  [95 %-97 %] 96 % (02/22 0518) Last BM Date: 05/22/19  Intake/Output from previous day: 02/21 0701 - 02/22 0700 In: 2352.5 [P.O.:960; I.V.:1392.5] Out: 900 [Urine:600; Emesis/NG output:300] Intake/Output this shift: Total I/O In: -  Out: 200 [Urine:200]  PE: General: pleasant, WD, cachectic white male who is laying in bed in NAD HEENT:  Sclera are noninjected.  PERRL.  Ears and nose without any masses or lesions.  Mouth is pink and moist Heart: regular, rate, and rhythm.  Normal s1,s2. No obvious murmurs, gallops, or rubs noted.  Palpable radial and pedal pulses bilaterally Lungs: CTAB, no wheezes, rhonchi, or rales noted.  Respiratory effort nonlabored Abd: soft, NT, ND, +BS, no masses, hernias, or organomegaly; midline surgical scar MS: all 4 extremities are symmetrical with no cyanosis, clubbing, or edema. Skin: warm and dry with no masses, lesions, or rashes Neuro: Cranial nerves 2-12 grossly intact, sensation intact in  bilateral LEs Psych: A&Ox3 with an appropriate affect.   Lab Results:  Recent Labs    05/22/19 0226 05/23/19 0251  WBC 6.2 5.2  HGB 9.3* 8.0*  HCT 29.3* 24.3*  PLT 456* 403*   BMET Recent Labs    05/22/19 0226 05/22/19 1620  NA 141 136  K 3.8 3.5  CL 102 98  CO2 25 28  GLUCOSE 62* 85  BUN 21 20  CREATININE 1.24 1.17  CALCIUM 8.5* 8.2*   PT/INR No results for input(s): LABPROT, INR in the last 72 hours. CMP     Component Value Date/Time   NA 136 05/22/2019 1620   NA 137 12/22/2018 1218   K 3.5 05/22/2019 1620   CL 98 05/22/2019 1620   CO2 28 05/22/2019 1620   GLUCOSE 85 05/22/2019 1620   BUN 20 05/22/2019 1620   BUN 11 12/22/2018 1218   CREATININE 1.17 05/22/2019 1620   CALCIUM 8.2 (L) 05/22/2019 1620   PROT 4.5 (L) 05/22/2019 1620   PROT 6.4 12/22/2018 1218   ALBUMIN 2.1 (L) 05/22/2019 1620   ALBUMIN 4.1 12/22/2018 1218   AST 12 (L) 05/22/2019 1620   ALT 10 05/22/2019 1620   ALKPHOS 66 05/22/2019 1620   BILITOT 0.6 05/22/2019 1620   BILITOT 0.8 12/22/2018 1218   GFRNONAA >60 05/22/2019 1620   GFRAA >60 05/22/2019 1620   Lipase     Component Value Date/Time   LIPASE 20 05/19/2019 1325       Studies/Results: DG Abd 2 Views  Result Date: 05/22/2019 CLINICAL DATA:  SBO EXAM: ABDOMEN - 2 VIEW  COMPARISON:  CTA abdomen/pelvis dated 05/19/2019. FINDINGS: Nonobstructive bowel gas pattern. No evidence of free air under the diaphragm on the upright view. Thoracoabdominal aortic stent.  Renal artery stents. Prior ventral hernia mesh repair.  Cholecystectomy clips. 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 IMPRESSION: No evidence of small bowel obstruction or free air. Additional stable ancillary findings as above. Electronically Signed   By: Julian Hy M.D.   On: 05/22/2019 09:36    Anti-infectives: Anti-infectives (From admission, onward)   None       Assessment/Plan Hx of recent AAA endovascular repair at [facility]  05/19/19 CAD Ischemic cardiomyopathy Hemochromatosis HTN  Ileus vs PSBO - patient has been tolerating liquids and ice chips with NGT clamped - film yesterday without obstruction - having bowel function - advance to soft diet, can d/c NGT either before or after per patient preference - BMET pending for today - recommend goal of K >4 and magnesium >2 for motility - abdomen benign, no indication for surgical intervention at this time - if patient tolerating soft diet and having bowel function, could d/c from surgical standpoint  FEN: soft diet VTE: SCDs, lovenox ID: no [email or web address]  LOS: 4 days    Brigid Re , [facility] 05/23/2019, 10:43 AM Please see Amion for pager number during day hours 7:00am-4:30pm

## 2019-05-23 NOTE — Plan of Care (Signed)

## 2019-05-23 NOTE — Progress Notes (Signed)
Patient refuses to have NG tube removed at this time.

## 2019-05-24 ENCOUNTER — Other Ambulatory Visit: Payer: Self-pay

## 2019-05-24 ENCOUNTER — Encounter (HOSPITAL_COMMUNITY): Payer: Self-pay | Admitting: Internal Medicine

## 2019-05-24 LAB — CULTURE, BLOOD (ROUTINE X 2)
Culture: NO GROWTH
Culture: NO GROWTH
Special Requests: ADEQUATE

## 2019-05-24 LAB — CBC
HCT: 26.7 % — ABNORMAL LOW (ref 39.0–52.0)
Hemoglobin: 8.9 g/dL — ABNORMAL LOW (ref 13.0–17.0)
MCH: 28.8 pg (ref 26.0–34.0)
MCHC: 33.3 g/dL (ref 30.0–36.0)
MCV: 86.4 fL (ref 80.0–100.0)
Platelets: 451 10*3/uL — ABNORMAL HIGH (ref 150–400)
RBC: 3.09 MIL/uL — ABNORMAL LOW (ref 4.22–5.81)
RDW: 14.7 % (ref 11.5–15.5)
WBC: 5 10*3/uL (ref 4.0–10.5)
nRBC: 0 % (ref 0.0–0.2)

## 2019-05-24 LAB — BASIC METABOLIC PANEL
Anion gap: 12 (ref 5–15)
BUN: 12 mg/dL (ref 8–23)
CO2: 22 mmol/L (ref 22–32)
Calcium: 8.1 mg/dL — ABNORMAL LOW (ref 8.9–10.3)
Chloride: 97 mmol/L — ABNORMAL LOW (ref 98–111)
Creatinine, Ser: 1.03 mg/dL (ref 0.61–1.24)
GFR calc Af Amer: >60 mL/min (ref >60)
GFR calc non Af Amer: >60 mL/min (ref >60)
Glucose, Bld: 85 mg/dL (ref 70–99)
Potassium: 3.8 mmol/L (ref 3.5–5.1)
Sodium: 131 mmol/L — ABNORMAL LOW (ref 135–145)

## 2019-05-24 MED ORDER — POLYETHYLENE GLYCOL 3350 17 G PO PACK: 17.0000 g | PACK | Freq: Every day | ORAL | 0 refills | Status: AC | PRN

## 2019-05-24 NOTE — Discharge Summary (Signed)
Physician Discharge Summary  Patrick Adams NUU:[id number] DOB: 1945/10/26 DOA: 05/19/2019  PCP: [name], MD  Admit date: 05/19/2019 Discharge date: 05/24/2019  Admitted From: Home Disposition:  Home  Recommendations for Outpatient Follow-up:  1. Follow up with PCP in 1 week 2. Follow up with the GI team as originally scheduled 3. Please obtain BMP/CBC in 1 week  Discharge Condition: Good CODE STATUS: Full  Diet recommendation: Heart Healthy  Brief/Interim Summary: MD 74-year-old male with a past medical history of hemochromatosis, CAD, hypertension, and recent endovascular AAA repair at [facility] approximately 1 week prior to admission on 05/19/2019 who presents with abdominal pain, nausea, and vomiting suspected to be possible postoperative ileus versus partial small bowel obstruction.  After surgery at [facility], he did have a postoperative ileus that responded in 1 to 2 days with conservative treatment.  IV hydration and NG tube placement was initiated.  His diet was slowly progressed.  Once he started having bowel movements and was eating, he was deemed ready for discharge.  Subjective on day of discharge: Reports eating and defecating well. Did have some reflux, but might've eaten too much. Hesitant to go home due to concern for having to come back, but otherwise feels good.   Discharge Diagnoses:  Principal Problem:   SBO (small bowel obstruction) (HCC) Active Problems:   Essential hypertension   Ischemic cardiomyopathy   History of abdominal aortic aneurysm (AAA) repair   Hemochromatosis   CHF (congestive heart failure) [facility])  Discharge Instructions  Discharge Instructions    Call MD for:  persistant nausea and vomiting   Complete by: As directed    Call MD for:  severe uncontrolled pain   Complete by: As directed    Call MD for:  temperature >100.4   Complete by: As directed    Diet - low sodium heart healthy   Complete by: As directed    Increase activity slowly    Complete by: As directed      Allergies as of 05/24/2019      Reactions   Brilinta [ticagrelor] Shortness Of Breath    Patient was taking 60 mg  Twice a day - changed to Clopidogrel 75 mg  03/02/19   Ciprofloxacin Anaphylaxis   Statins Other (See Comments)   Joint pain.      Medication List    STOP taking these medications   lubiprostone 24 MCG capsule Commonly known as: AMITIZA     TAKE these medications   aspirin EC 81 MG tablet Take 81 mg by mouth at bedtime.   cholecalciferol 25 MCG (1000 UNIT) tablet Commonly known as: VITAMIN D3 Take 1,000 Units by mouth daily.   clopidogrel 75 MG tablet Commonly known as: PLAVIX Take 1 tablet (75 mg total) by mouth daily. What changed: when to take this   losartan 25 MG tablet Commonly known as: COZAAR TAKE 1 TABLET BY MOUTH EVERY DAY What changed: when to take this   nitroGLYCERIN 0.4 MG SL tablet Commonly known as: Nitrostat Place 1 tablet (0.4 mg total) under the tongue every 5 (five) minutes as needed for chest pain.   polyethylene glycol 17 g packet Commonly known as: MIRALAX / GLYCOLAX Take 17 g by mouth daily as needed (Constipation).       Allergies  Allergen Reactions  . Brilinta [Ticagrelor] Shortness Of Breath     Patient was taking 60 mg  Twice a day - changed to Clopidogrel 75 mg  03/02/19  . Ciprofloxacin Anaphylaxis  .  Statins Other (See Comments)    Joint pain.    Consultations:  General surgery   Procedures/Studies: DG Abd 2 Views  Result Date: 05/22/2019 CLINICAL DATA:  SBO EXAM: ABDOMEN - 2 VIEW COMPARISON:  CTA abdomen/pelvis dated 05/19/2019. FINDINGS: Nonobstructive bowel gas pattern. No evidence of free air under the diaphragm on the upright view. Thoracoabdominal aortic stent.  Renal artery stents. Prior ventral hernia mesh repair.  Cholecystectomy clips. 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 IMPRESSION: No evidence of small bowel obstruction or free air. Additional  stable ancillary findings as above. Electronically Signed   By: Julian Hy M.D.   On: 05/22/2019 09:36   DG Abd 2 Views  Result Date: 05/20/2019 CLINICAL DATA:  74 year old male status post tube placement. EXAM: ABDOMEN - 2 VIEW COMPARISON:  Earlier radiograph dated 05/20/2019. FINDINGS: Partially visualized enteric tube with side port and tip in the left upper abdomen, likely in the proximal stomach. Mildly dilated loops of small bowel containing diluted contrast again noted. There appears to be new contrast in the cecum likely representing contrast traversing into the colon. Continued follow-up recommended. No other interval change since the earlier radiograph. IMPRESSION: 1. Enteric tube in the proximal stomach. 2. Probable progression of contrast into the cecum. Continued follow-up recommended. Electronically Signed   By: Anner Crete M.D.   On: 05/20/2019 18:41   DG Abd Acute W/Chest  Result Date: 05/19/2019 CLINICAL DATA:  Chronic 8 month history of midline abdominal pain associated with nausea and vomiting. Prior thoracic and abdominal aortic stent grafting. EXAM: DG ABDOMEN ACUTE W/ 1V CHEST COMPARISON:  CT angio chest abdomen and pelvis 05/07/2019. Chest x-ray 02/01/2018. FINDINGS: Mild gaseous distension of adjacent loops of jejunum in the LEFT UPPER QUADRANT demonstrating air-fluid levels on the ERECT image. No free intraperitoneal air. Expected stool burden and residual contrast material throughout the normal caliber and decompressed colon, with numerous diverticula involving the sigmoid colon; it is unclear where the contrast material originated, as it was not given for the recent CTA and there are no interval imaging studies since then. Thoracic and abdominal aortic stent grafting with stenting of the renal arteries. Cardiac silhouette mildly enlarged, unchanged. The opacity in the superior segment RIGHT LOWER LOBE identified on the recent CTA has improved is visible though much less  conspicuous on the chest x-ray. Linear scarring is present in the RIGHT LOWER LOBE in a vertical orientation, unchanged. New linear atelectasis at the LEFT lung base. IMPRESSION: 1. Partial small bowel obstruction involving the jejunum in the LEFT UPPER QUADRANT. No free intraperitoneal air. 2. Diverticulosis involving the sigmoid colon. 3. The superior segment RIGHT LOWER LOBE opacity identified on the recent CTA is still present though much less conspicuous on chest x-ray. The recommendation for a follow-up CT in 3 months, early May, 2021, remains. 4. New atelectasis involving the LEFT lung base. No acute cardiopulmonary disease otherwise. Electronically Signed   By: Evangeline Dakin M.D.   On: 05/19/2019 13:37   DG Abd Portable 1V-Small Bowel Obstruction Protocol-initial, 8 hr delay  Result Date: 05/20/2019 CLINICAL DATA:  Small-bowel obstruction. Image obtained 8 hours after injection of contrast through the patient's nasogastric tube. EXAM: PORTABLE ABDOMEN - 1 VIEW COMPARISON:  Yesterday. Chest, abdomen and pelvis CTA obtained yesterday. FINDINGS: Interval dilute contrast in multiple mildly dilated loops of small bowel. No contrast in the distal small bowel. Previously demonstrated contrast in the colon and urinary bladder. Stable vascular stents and hernia repair mesh. The nasogastric tube has  been advanced with its tip and side hole in the proximal stomach. Cholecystectomy clips and single right lower quadrant abdominal surgical clip. Moderate levoconvex lumbar rotary scoliosis and degenerative changes. IMPRESSION: Interval dilute contrast in multiple mildly dilated loops of small bowel. This is compatible with partial small bowel obstruction, without significant change. The contrast has not progressed into the distal small bowel at this point. A repeat image could be obtained in 8 hours to evaluate for distal progression. Electronically Signed   By: Claudie Revering M.D.   On: 05/20/2019 09:40   DG Abd  Portable 1V-Small Bowel Protocol-Position Verification  Result Date: 05/19/2019 CLINICAL DATA:  NG tube placement EXAM: PORTABLE ABDOMEN - 1 VIEW COMPARISON:  None. FINDINGS: The distal tip of the NG tube is in the left upper quadrant, likely within the gastric fundus. The side port appears to be just above the GE junction. Contrast in the bladder and colon. Vascular stents identified. No evidence of bowel obstruction. IMPRESSION: 1. The distal tip of the NG tube is in the stomach with the side port is just above the GE junction. Recommend advancing 4-6 cm. 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 Patient presents with abdominal pain. EXAM: CT ANGIOGRAPHY CHEST, ABDOMEN AND PELVIS TECHNIQUE: Multidetector CT imaging through the chest, abdomen and pelvis was performed using the standard protocol during bolus administration of intravenous contrast. Multiplanar reconstructed images and MIPs were obtained and reviewed to evaluate the vascular anatomy. CONTRAST:  167mL OMNIPAQUE IOHEXOL 350 MG/ML SOLN COMPARISON:  05/07/2019 CT angiogram of the chest, abdomen and pelvis. FINDINGS: CTA CHEST FINDINGS Cardiovascular: Normal heart size. No significant pericardial effusion/thickening. Left anterior descending and right coronary atherosclerosis. Atherosclerotic thoracic aorta. Long segment thoracoabdominal aortic aneurysm extending from the mid descending thoracic aorta into the infrarenal abdominal aorta status post stent graft repair, with interval placement of stent grafts in entire descending thoracic aorta with previous stent graft in the abdominal aorta. Descending thoracic aorta is tortuous. The lower descending thoracic aortic  aneurysm sac measures 8.1 cm maximum diameter (series 11/image 87), compared to 8.3 cm on 05/07/2019, slightly decreased. Tiny focus of nonspecific gas within descending thoracic aortic aneurysm sac. No acute intramural hematoma or acute dissection in the thoracic aorta. Aortic arch branch vessels are patent. No appreciable endoleak in the thoracic aorta. Normal caliber main pulmonary artery. No central pulmonary emboli. Mediastinum/Nodes: No discrete thyroid nodules. Unremarkable esophagus. No pathologically enlarged axillary, mediastinal or hilar lymph nodes. Lungs/Pleura: No pneumothorax. Small dependent bilateral pleural effusions. Mild centrilobular and paraseptal emphysema. Stable 4 mm basilar right upper lobe solid pulmonary nodule along the minor fissure (series 5/image 34). Sub solid 4.6 x 2.5 cm masslike opacity in the superior segment right lower lobe (series 5/image 29) with 2.4 cm solid component, unchanged since 05/07/2019 chest CT using similar measurement technique. Passive atelectasis in the left greater than right lower lobes. No new significant pulmonary nodules. Musculoskeletal: No aggressive appearing focal osseous lesions. Mild thoracic spondylosis. Review of the MIP images confirms the above findings. CTA ABDOMEN AND PELVIS FINDINGS VASCULAR Aorta: Prior abdominal aortic stent graft repair long segment suprarenal and infrarenal abdominal aortic aneurysm, with maximum abdominal aortic aneurysm sac diameter measuring 6.7 cm in the suprarenal abdominal aorta (series 11/image 81), unchanged.  No evidence of an endoleak in abdominal aorta. Prior aorto bi-iliac surgical graft repair, which appears patent. Celiac: Stable stent within the celiac trunk, which appears patent. SMA: Stable proximal SMA stent, which demonstrates approximately 50% luminal narrowing at the origin (series 14/image 119), slightly worsened since 05/07/2019. SMA patent distal to the SMA stent with no thrombosis. Renals: Stable  appearance of the bilateral proximal renal artery stents, which appear patent without high-grade stenosis. IMA: Chronic occlusion. Inflow: No high-grade iliac graft or common femoral artery stenoses. 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 Veins: No obvious venous abnormality within the limitations of this arterial phase study. Review of the MIP images confirms the above findings. NON-VASCULAR Hepatobiliary: Normal liver size. Stable subcentimeter hypodense anterior liver lesion, too small to characterize. No new liver lesions. Cholecystectomy. No biliary ductal dilatation. Pancreas: Normal, with no mass or duct dilation. Spleen: Normal size. No mass. Adrenals/Urinary Tract: Normal adrenals. Stable scattered subcentimeter hypodense renal cortical lesions, too small to characterize, requiring no follow-up. No new renal lesions. No hydronephrosis. Normal bladder. Stomach/Bowel: Normal non-distended stomach. Several mildly dilated small bowel loops throughout the abdomen up to 3.1 cm diameter, with air-fluid levels. No definite small bowel wall thickening or pneumatosis. Distal small bowel is collapsed. No discrete small bowel caliber transition identified. Appendix not discretely visualized. Mild sigmoid diverticulosis, with no large bowel wall thickening or significant pericolonic fat stranding. Vascular/Lymphatic: No pathologically enlarged lymph nodes in the abdomen or pelvis. Reproductive: Mild prostatomegaly. Nonspecific internal prostatic calcifications. Other: No pneumoperitoneum. Small volume ascites, mildly increased from prior. No focal fluid collections. Musculoskeletal: No aggressive appearing focal osseous lesions. Moderate thoracic spondylosis. Review of the MIP images confirms the above findings. IMPRESSION: 1. Mildly dilated small bowel loops throughout the abdomen with collapsed distal small bowel. Findings are concerning for mild mechanical distal small bowel obstruction, although no  discrete caliber transition is identified. Differential includes adynamic ileus. No free air. 2. Subsolid 4.6 cm superior segment right lower lobe masslike opacity with 2.4 cm solid component, persistent and unchanged since recent CT. Primary bronchogenic carcinoma not excluded in this high risk patient with smoking related changes in the lungs. Outpatient PET-CT suggested for further evaluation when clinically feasible. 3. 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 No evidence of an endoleak. The lower descending thoracic aortic aneurysm sac measures 8.1 cm maximum diameter, slightly decreased. Tiny focus of nonspecific gas within the descending thoracic aortic aneurysm sac, presumably due to recent intervention. 4. Narrowing of the SMA stent at the origin, approximately 50%, appearing slightly worsened. SMA remains patent. 5. Small dependent bilateral pleural effusions. Passive atelectasis in the left greater than right lower lobes. 6. Aortic Atherosclerosis (ICD10-I70.0) and Emphysema (ICD10-J43.9). Electronically Signed   By: Ilona Sorrel M.D.   On: 05/19/2019 15:47   CT Angio Chest/Abd/Pel for Dissection W and/or Wo Contrast  Addendum Date: 05/07/2019   ADDENDUM REPORT: 05/07/2019 18:38 ADDENDUM: [employer] request has been made for outside prior CT performed at [facility] dated 01/2019 for more recent comparison imaging. Case will be addended for comparison to this imaging when available. Examination was discussed by telephone with Dr. Thelma Comp at [facility] and Dr. Leonette Monarch with [facility] vascular surgery. Electronically Signed   By: Eddie Candle M.D.   On: 05/07/2019 18:38   Result Date: 05/07/2019 CLINICAL DATA:  Status post abdominal aortic aneurysm repair, 01/2019, sudden onset mid epigastric pain and nausea, evaluate for evidence  of endoleak EXAM: CT ANGIOGRAPHY CHEST, ABDOMEN AND PELVIS TECHNIQUE:  Multidetector CT imaging through the chest, abdomen and pelvis was performed using the standard protocol during bolus administration of intravenous contrast. Multiplanar reconstructed images and MIPs were obtained and reviewed to evaluate the vascular anatomy. CONTRAST:  181mL OMNIPAQUE IOHEXOL 350 MG/ML SOLN COMPARISON:  08/15/2018 FINDINGS: CTA CHEST FINDINGS Cardiovascular: Preferential opacification of the thoracic aorta. The aortic root and tubular ascending thoracic aorta are normal in caliber. There is severe mixed atherosclerosis of the descending thoracic aorta, with interval stent endograft repair of the distal descending thoracic aorta and abdominal aorta. There has been interval development of a large saccular aneurysm component or pseudoaneurysm near the proximal landing site of the stent in the lower thorax, the caliber of the aneurysm sac measuring at least 6.8 x 6.8 cm (series 7, image 112, series 10, image 73). Normal heart size. Three-vessel coronary artery calcifications and/or stents. No pericardial effusion. Mediastinum/Nodes: No enlarged mediastinal, hilar, or axillary lymph nodes. Thyroid gland, trachea, and esophagus demonstrate no significant findings. Lungs/Pleura: There is a spiculated subpleural opacity of the superior segment right lower lobe measuring 2.6 x 2.5 cm (series 6, image 31). No pleural effusion or pneumothorax. Musculoskeletal: No chest wall abnormality. No acute or significant osseous findings. Review of the MIP images confirms the above findings. CTA ABDOMEN AND PELVIS FINDINGS VASCULAR Status post interval stent endograft repair of the thoracoabdominal aorta, with snorkel stenting of the branch vessel origins. Branch vessel stents appear patent. The excluded aneurysm sac is significantly increased in caliber, measuring 6.5 x 5.9 cm near the SMA origin, previously 5.0 x 5.0 cm when measured similarly (series 7, image 155). There is no direct evidence of endoleak i.e.  contrast enhancement of the excluded aneurysm sac on this single contrast phase examination. The patient is status post previous graft repair of the native common iliac arteries. 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 Review of the MIP images confirms the above findings. NON-VASCULAR Hepatobiliary: No solid liver abnormality is seen. No gallstones, gallbladder wall thickening, or biliary dilatation. Pancreas: Unremarkable. No pancreatic ductal dilatation or surrounding inflammatory changes. Spleen: Normal in size without significant abnormality. Adrenals/Urinary Tract: Adrenal glands are unremarkable. Kidneys are normal, without renal calculi, solid lesion, or hydronephrosis. Bladder is unremarkable. Stomach/Bowel: Stomach is within normal limits. Status post appendectomy. Sigmoid diverticulosis. Lymphatic: No enlarged abdominal or pelvic lymph nodes. Reproductive: No mass or other significant abnormality. Other: Mesh repair of the ventral abdomen. Small volume ascites about the abdomen and pelvis. Musculoskeletal: No acute or significant osseous findings. Review of the MIP images confirms the above findings. IMPRESSION: 1. There is severe mixed atherosclerosis of the descending thoracic aorta, with interval stent endograft repair of the distal descending thoracic aorta and abdominal aorta. 2. There has been interval development of a large saccular aneurysm component or pseudoaneurysm near the proximal landing site of the stent in the lower thorax, the caliber of the aneurysm sac measuring at least 6.8 x 6.8 cm. 3. The excluded aneurysm sac is significantly increased in caliber, measuring 6.5 x 5.9 cm near the SMA origin, previously 5.0 x 5.0 cm when measured similarly (series 7, image 155). There is no direct evidence of endoleak i.e. contrast enhancement of the excluded aneurysm sac  on this single contrast phase examination. 4. There are no specific findings to suggest acute aneurysm rupture. 5. The  patient is status post previous graft repair of the native common iliac arteries. 6. 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 7.  Small volume nonspecific ascites in the abdomen and pelvis. 8. There is a spiculated subpleural opacity of the superior segment right lower lobe measuring 2.6 x 2.5 cm (series 6, image 31), nonspecific and possibly infectious or inflammatory, although concerning for malignancy. Recommend follow-up CT in 3 months to assess for stability or resolution when clinically appropriate. 9.  Coronary artery disease.  Aortic Atherosclerosis (ICD10-I70.0). Electronically Signed: By: Eddie Candle M.D. On: 05/07/2019 16:16       Discharge Exam: Vitals:   05/23/19 2040 05/24/19 0624  BP: 131/67 133/68  Pulse: 71 73  Resp: 14 14  Temp: 97.9 F (36.6 C) 98.4 F (36.9 C)  SpO2: 98% 97%    General: Pt is alert, awake, not in acute distress Cardiovascular: RRR, S1/S2 +, no edema Respiratory: CTA bilaterally, no wheezing, no rhonchi, no respiratory distress, no conversational dyspnea  Abdominal: Soft, NT, ND, bowel sounds + Extremities: no edema, no cyanosis Psych: Normal mood and affect, stable judgement and insight     The results of significant diagnostics from this hospitalization (including imaging, microbiology, ancillary and laboratory) are listed below for reference.     Microbiology: Recent Results (from the past 240 hour(s))  Culture, blood (routine x 2)     Status: None   Collection Time: 05/19/19  3:50 PM   Specimen: BLOOD  Result Value Ref Range Status   Specimen Description BLOOD SITE NOT SPECIFIED  Final   Special Requests   Final    BOTTLES DRAWN AEROBIC ONLY Blood Culture results may not be optimal due to an  inadequate volume of blood received in culture bottles   Culture   Final    NO GROWTH 5 DAYS Performed at [facility], [place] [address], [place], [place] [address]    Report Status 05/24/2019 FINAL  Final  Culture, blood (routine x 2)     Status: None   Collection Time: 05/19/19  4:00 PM   Specimen: BLOOD RIGHT ARM  Result Value Ref Range Status   Specimen Description BLOOD RIGHT ARM  Final   Special Requests   Final    BOTTLES DRAWN AEROBIC AND ANAEROBIC Blood Culture adequate volume   Culture   Final    NO GROWTH 5 DAYS Performed at [facility], [place] [address], [place], [place] [address]    Report Status 05/24/2019 FINAL  Final  SARS CORONAVIRUS 2 (TAT 6-24 HRS) Nasopharyngeal Nasopharyngeal Swab     Status: None   Collection Time: 05/19/19  4:32 PM   Specimen: Nasopharyngeal Swab  Result Value Ref Range Status   SARS Coronavirus 2 NEGATIVE NEGATIVE Final    Comment: (NOTE) SARS-CoV-2 target nucleic acids are NOT DETECTED. The SARS-CoV-2 RNA is generally detectable in upper and lower respiratory specimens during the acute phase of infection. Negative results do not preclude SARS-CoV-2 infection, do not rule out co-infections with other pathogens, and should not be used as the sole basis for treatment or other patient management decisions. Negative results must be combined with clinical observations, patient history, and epidemiological information. The expected result is Negative. Fact Sheet for Patients: SugarRoll.be Fact Sheet for Healthcare Providers: https://www.woods-mathews.com/ This test is not yet approved or cleared by the [place] FDA and  has been authorized for detection and/or diagnosis of SARS-CoV-2 by FDA under an Emergency Use Authorization (EUA). This EUA will remain  in effect (meaning this test can be used) for the duration of the COVID-19 declaration under Section 56 4(b)(1) of the Act, 21  U.S.C. section 360bbb-3(b)(1), unless the authorization is terminated or revoked sooner. Performed at [facility], [place] [address] [place], [place] [address]      Labs: Basic Metabolic Panel: Recent Labs  Lab 05/21/19 0255 05/22/19 0226 05/22/19 1620 05/23/19 0251 05/24/19 0204  NA 140 141 136 132* 131*  K 3.6 3.8 3.5 3.6 3.8  CL 101 102 98 95* 97*  CO2 26 25 28 25 22   GLUCOSE 78 62* 85 66* 85  BUN 22 21 20 16 12   CREATININE 1.29* 1.24 1.17 1.09 1.03  CALCIUM 8.4* 8.5* 8.2* 7.9* 8.1*  MG 1.9  --   --   --   --   PHOS 3.9  --   --   --   --    Liver Function Tests: Recent Labs  Lab 05/19/19 1325 05/20/19 0220 05/22/19 0226 05/22/19 1620  AST 16 14* 13* 12*  ALT 12 11 10 10   ALKPHOS 81 82 65 66  BILITOT 0.7 1.0 1.4* 0.6  PROT 6.1* 5.7* 4.8* 4.5*  ALBUMIN 2.8* 2.6* 2.3* 2.1*   Recent Labs  Lab 05/19/19 1325  LIPASE 20   CBC: Recent Labs  Lab 05/19/19 1325 05/20/19 0220 05/21/19 0255 05/22/19 0226 05/23/19 0251 05/23/19 1141 05/24/19 0204  WBC 15.9*   < > 7.2 6.2 5.2 5.2 5.0  NEUTROABS 14.6*  --   --   --  3.7  --   --   HGB 12.3*   < > 10.1* 9.3* 8.0* 8.7* 8.9*  HCT 40.2   < > 31.2* 29.3* 24.3* 26.4* 26.7*  MCV 95.3   < > 89.9 91.3 89.0 88.9 86.4  PLT 409*   < > 442* 456* 403* 457* 451*   < > = values in this interval not displayed.   CBG: Recent Labs  Lab 05/19/19 1333  GLUCAP 122*   Urinalysis    Component Value Date/Time   COLORURINE YELLOW 08/15/2018 1956   APPEARANCEUR CLEAR 08/15/2018 1956   LABSPEC 1.023 08/15/2018 1956   PHURINE 6.0 08/15/2018 1956   GLUCOSEU NEGATIVE 08/15/2018 1956   HGBUR NEGATIVE 08/15/2018 [place] NEGATIVE 08/15/2018 1956   KETONESUR 5 (A) 08/15/2018 1956   PROTEINUR NEGATIVE 08/15/2018 1956   NITRITE NEGATIVE 08/15/2018 1956   LEUKOCYTESUR NEGATIVE 08/15/2018 1956   Microbiology Recent Results (from the past 240 hour(s))  Culture, blood (routine x 2)     Status: None   Collection  Time: 05/19/19  3:50 PM   Specimen: BLOOD  Result Value Ref Range Status   Specimen Description BLOOD SITE NOT SPECIFIED  Final   Special Requests   Final    BOTTLES DRAWN AEROBIC ONLY Blood Culture results may not be optimal due to an inadequate volume of blood received in culture bottles   Culture   Final    NO GROWTH 5 DAYS Performed at [facility], [place] [address], [place], [place] [address]    Report Status 05/24/2019 FINAL  Final  Culture, blood (routine x 2)     Status: None   Collection Time: 05/19/19  4:00 PM   Specimen: BLOOD RIGHT ARM  Result Value Ref Range Status   Specimen Description BLOOD RIGHT ARM  Final   Special Requests   Final    BOTTLES DRAWN AEROBIC AND ANAEROBIC Blood Culture adequate volume   Culture   Final    NO GROWTH 5 DAYS Performed at [facility], [address] [place], [place] [address]    Report Status 05/24/2019 FINAL  Final  SARS CORONAVIRUS 2 (TAT 6-24 HRS) Nasopharyngeal Nasopharyngeal Swab     Status: None   Collection Time: 05/19/19  4:32 PM   Specimen: Nasopharyngeal Swab  Result Value Ref Range Status   SARS Coronavirus 2 NEGATIVE NEGATIVE Final    Comment: (NOTE) SARS-CoV-2 target nucleic acids are NOT DETECTED. The SARS-CoV-2 RNA is generally detectable in upper and lower respiratory specimens during the acute phase of infection. Negative results do not preclude SARS-CoV-2 infection, do not rule out co-infections with other pathogens, and should not be used as the sole basis for treatment or other patient management decisions. Negative results must be combined with clinical observations, patient history, and epidemiological information. The expected result is Negative. Fact Sheet for Patients: SugarRoll.be Fact Sheet for Healthcare Providers: https://www.woods-mathews.com/ This test is not yet approved or cleared by the [place] FDA and  has been authorized for detection  and/or diagnosis of SARS-CoV-2 by FDA under an Emergency Use Authorization (EUA). This EUA will remain  in effect (meaning this test can be used) for the duration of the COVID-19 declaration under Section 56 4(b)(1) of the Act, 21 U.S.C. section 360bbb-3(b)(1), unless the authorization is terminated or revoked sooner. Performed at [facility], [place] [address], [place], [place] [address]      Patient was seen and examined on the day of discharge and was found to be in stable condition. Time coordinating discharge: 35 minutes including assessment and coordination of care, as well as examination of the patient.   SIGNED:  Shelda Pal, DO [facility] 05/24/2019, 12:04 PM

## 2019-05-24 NOTE — Plan of Care (Signed)
  Problem: Education: Goal: Knowledge of General Education information will improve Description Including pain rating scale, medication(s)/side effects and non-pharmacologic comfort measures Outcome: Progressing   

## 2019-05-24 NOTE — Plan of Care (Signed)

## 2019-05-24 NOTE — Progress Notes (Signed)
Discussed NG tube removal with patient. Patient agreed to NG removal. Tube removed intact with no trauma caused to patient. Pt tolerated well. Denied pain or distress. 05/24/2019 @ [place], RN

## 2019-05-24 NOTE — Progress Notes (Signed)
RN reviewed discharge paperwork and attached education with patient. Patient verbalized understanding and reports no further questions. Paperwork given to patient. Pt awaiting transportation.

## 2019-05-24 NOTE — Care Management Important Message (Signed)
Important Message  Patient Details  Name: Patrick Adams MRN: [id number] Date of Birth: September 01, 1945   Medicare Important Message Given:  Yes     Memory Argue 05/24/2019, 12:39 PM    UNABLE TO GIVE IM BEFORE PATIENT DISCHARGE. IM MAILED TO PATIENT

## 2019-05-24 NOTE — Discharge Instructions (Signed)

## 2019-06-03 ENCOUNTER — Encounter: Payer: Self-pay | Admitting: General Practice

## 2019-06-16 ENCOUNTER — Encounter: Payer: Self-pay | Admitting: Internal Medicine

## 2019-06-16 ENCOUNTER — Ambulatory Visit: Payer: Medicare HMO | Admitting: Internal Medicine

## 2019-06-16 ENCOUNTER — Other Ambulatory Visit: Payer: Self-pay

## 2019-06-16 VITALS — BP 170/80 | HR 76 | Temp 98.5°F | Ht 72.0 in | Wt 136.2 lb

## 2019-06-16 DIAGNOSIS — K5909 Other constipation: Secondary | ICD-10-CM | POA: Diagnosis not present

## 2019-06-16 DIAGNOSIS — Z7902 Long term (current) use of antithrombotics/antiplatelets: Secondary | ICD-10-CM | POA: Diagnosis not present

## 2019-06-16 DIAGNOSIS — R634 Abnormal weight loss: Secondary | ICD-10-CM

## 2019-06-16 DIAGNOSIS — R195 Other fecal abnormalities: Secondary | ICD-10-CM | POA: Diagnosis not present

## 2019-06-16 DIAGNOSIS — R918 Other nonspecific abnormal finding of lung field: Secondary | ICD-10-CM

## 2019-06-16 NOTE — Patient Instructions (Signed)
You can try less of the MiraLax  Also consider taking Gas-ex at dinner as well as bedtime.  I will plan to look out for the PET results but if you do not hear from me a week after it is done call me  I appreciate the opportunity to care for you. Gatha Mayer, MD, [facility]

## 2019-06-16 NOTE — Progress Notes (Addendum)
Patrick Adams Yard[place] 74 y.o. 03-Jun-1945 [id number]  Assessment & Plan:   Encounter Diagnoses  Name Primary?  . Chronic constipation Yes  . Heme + stool   . Long term current use of antithrombotics/antiplatelets   . Loss of weight   . Lung mass     Continue MiraLAX increase Gas-X you may need to titrate the MiraLAX for less gas.  Need to wait and see what the PET scan he is having shows, because of his heme positive stool and issues a colonoscopy is appropriate and he is improved over last visit and getting to the point where I think we could consider a colonoscopy but it may not be necessary if he has a lung cancer.  I will look for the PET scan results.  He is having that next week.  I told him to call me if he does not hear back from [place].  CC: Toborg, Cyndie Mull, MD  PET scan results  IMPRESSION:  Hypoventilatory solid right pulmonary nodule concerning for malignancy. Recommend biopsy. Mild hypermetabolic activity in the right hilum, cannot exclude a small node.   Will contact him to see what is planned  Reasonable to do a colonoscopy (could have a lung met) Needs double prep Hold Plavix  Gatha Mayer, MD, [facility] 06/23/2019 12:14 PM   Subjective:   Chief Complaint: Constipation heme positive stool  HPI  The patient was seen the other month, weight loss heme positive stool constipation issues.  He had been hospitalized for vascular procedures and had a small bowel obstruction, at [facility].  He was frail and not in any shape for colonoscopy.  He was on Plavix and still is.  MiraLAX has made a big difference he is moving his bowels he is improved and his weight is coming up some from its lowest point though it is lower than when I saw him he says it is rising.  He is eating better.  Unfortunately he has a lung mass and is going to have a PET scan.  He does complain of some explosive gassiness at times Gas-X at bedtime seems to help some. Wt Readings  from Last 3 Encounters:  06/16/19 136 lb 3.2 oz (61.8 kg)  05/18/19 144 lb (65.3 kg)  05/07/19 152 lb 1.9 oz (69 kg)    Allergies  Allergen Reactions  . Brilinta [Ticagrelor] Shortness Of Breath     Patient was taking 60 mg  Twice a day - changed to Clopidogrel 75 mg  03/02/19  . Ciprofloxacin Anaphylaxis  . Statins Other (See Comments)    Joint pain.   Current Meds  Medication Sig  . aspirin EC 81 MG tablet Take 81 mg by mouth at bedtime.  . cholecalciferol (VITAMIN D3) 25 MCG (1000 UT) tablet Take 1,000 Units by mouth daily.  . clopidogrel (PLAVIX) 75 MG tablet Take 1 tablet (75 mg total) by mouth daily. (Patient taking differently: Take 75 mg by mouth at bedtime. )  . losartan (COZAAR) 25 MG tablet TAKE 1 TABLET BY MOUTH EVERY DAY (Patient taking differently: Take 25 mg by mouth at bedtime. )  . nitroGLYCERIN (NITROSTAT) 0.4 MG SL tablet Place 1 tablet (0.4 mg total) under the tongue every 5 (five) minutes as needed for chest pain.  . polyethylene glycol (MIRALAX / GLYCOLAX) 17 g packet Take 17 g by mouth daily as needed (Constipation).   Past Medical History:  Diagnosis Date  . AAA (abdominal aortic aneurysm) ([place])   . CAD  S/P PCI 01/2018   a) 01/31/2018: p-mLAD 60-100% (DES PCI with 2.75 mm x 38 mm Synergy DES - 4.0-3.6.3.0 mm tapered post-dilation); b) p-mRCA 55%-85%-40% - Synergy DES 3.5 mm x 32 mm -> 4.0 mm; & RPAV 90% --> Synergy DES 2.5 mm x 12 mm -> 3.0 mm;; c) stents patent on re-look Cath 03/19/2018  . Diverticulitis   . Erectile dysfunction   . Hemochromatosis   . History of abdominal aortic aneurysm (AAA) repair 2008   With thoracic aortic aneurysm being monitored  . Hyperlipidemia   . Hypertension   . Ischemic cardiomyopathy 01/31/2018   Initial post STEMI EF by echo 40 to 45% --> by cath 1 month later LV gram showed EF 55 to 60%.  . MDD (major depressive disorder)   . Nephrolithiasis   . PAD (peripheral artery disease) ([place])   . Prostate nodule   . SBO (small  bowel obstruction) ([place]) 05/2019  . ST elevation (STEMI) myocardial infarction involving left anterior descending coronary artery ([place]) 01/31/2018   Found to have pLAD 60%-mLAD 100% (DES PCI), distal LAD diffuse 60-70% (med Rx), OM1 65%; also signifiicant p-m RCA & RPAV disease  --> Initial EF ~45% with elevated LVEDP  . 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 ABDOMINAL AORTIC ANEURYSM REPAIR  2008  . ABDOMINAL AORTIC ANEURYSM REPAIR    . COLONOSCOPY  2016  . CORONARY STENT INTERVENTION N/A 02/03/2018   Procedure: CORONARY STENT INTERVENTION;  Surgeon: Belva Crome, MD;  Location: [facility] INVASIVE CV LAB;;   staged PCI p-mRCA (Synergy DES 3.5 mm x 32 mm--4.0 mm covering entire diseased segment from  proximal 55 through the 85 and 40% lesions in the mid vessel), RPAV (Synergy DES 2.5 mm x 12 mm--3.0 mm)  . CORONARY/GRAFT ACUTE MI REVASCULARIZATION N/A 01/31/2018   Procedure: Coronary/Graft Acute MI Revascularization;  Surgeon: Leonie Man, MD;  Location: [place] CV LAB;; ANTERIOR-INFERIOR STEMI: pLAD 60% - mLAD 100% (PCI-Synergy DES 2.75 mm at 38 mm--4.1-3.6-3.0 mm tapered post dilation)  . LEFT HEART CATH AND CORONARY ANGIOGRAPHY N/A 01/31/2018   Procedure: LEFT HEART CATH AND CORONARY ANGIOGRAPHY;  Surgeon: Leonie Man, MD;  Location: [facility] INVASIVE CV LAB;;; (anterior inferior STEMI) -> pLAD 60% - mLAD 100% (DES PCI); p-m RCA 55%, mRCA 85%, mRCA 40% (diffuse segmental disease), RPAV 90% (ulcerated), OM1 65%.  EF 35-45%, moderately elevated LVEDP --acute combined systolic and diastolic heart failure, ischemic cardiomyopathy  . LEFT HEART CATH AND CORONARY ANGIOGRAPHY N/A 02/03/2018   Procedure: LEFT HEART CATH AND CORONARY ANGIOGRAPHY;  Surgeon: Belva Crome, MD;  Location: [place] CV LAB;  Service: Cardiovascular;  Laterality: N/A;  . LEFT HEART CATH AND CORONARY ANGIOGRAPHY N/A 03/19/2018    Procedure: LEFT HEART CATH AND CORONARY ANGIOGRAPHY;  Surgeon: Martinique, Peter M, MD;  Location: [place] CV LAB;  Service: Cardiovascular;  Patent stent in p-mLAD, patent p-m RCA stent and RPAV stent.  Distal LAD 70%, OM1 65%.  EF 55-60%.  . TRANSTHORACIC ECHOCARDIOGRAM  02/01/2018   (Insetting of anterior-inferior STEMI) --> moderate LVH.  EF 40 to 45%.  Anterior, anteroseptal and apical severe hypokinesis to akinesis.  Indeterminate LV filling pressures.  Aortic sclerosis, no stenosis.   Social History   Social History Narrative   Divorced, retired   Lives alone   1 daughter   Smoker   No drugs/EtOH   family history includes Colonic polyp  in his mother; Heart disease in his father; Liver disease in his father.   Review of Systems As above  Objective:   Physical Exam BP (!) 170/80   Pulse 76   Temp 98.5 F (36.9 C)   Ht 6' (1.829 m)   Wt 136 lb 3.2 oz (61.8 kg)   BMI 18.47 kg/m

## 2019-06-23 ENCOUNTER — Telehealth: Payer: Self-pay | Admitting: Internal Medicine

## 2019-06-23 NOTE — Telephone Encounter (Signed)
Patient returning your call.

## 2019-06-23 NOTE — Telephone Encounter (Signed)
Need to contact him and tell him I see the PET scan of lung mass is abnormal  Nothing in colon but I think it will make sense to have a colonoscopy for heme + stool unless his other doctors disagree  Would hold Plavix 5 days and check with cardiology/vascular surgery - if not able to hold can do on Plavix  Needs a double prep

## 2019-06-23 NOTE — Telephone Encounter (Signed)
I left a message on patient's voice mail for patient to call me back.

## 2019-06-24 NOTE — Telephone Encounter (Signed)
I spoke with Patrick Adams and he doesn't wish to set up a date yet for the colonoscopy.  He wants to get his lung biopsy done first, this is set up for April 7th. He said he will call me back after he gets thru that.

## 2019-06-24 NOTE — Telephone Encounter (Signed)
Pt stated that he is returning your call.  °

## 2019-06-27 NOTE — Telephone Encounter (Signed)
Recall placed

## 2019-06-27 NOTE — Telephone Encounter (Signed)
Please place a July recall as back up

## 2019-07-19 ENCOUNTER — Other Ambulatory Visit: Payer: Self-pay | Admitting: Cardiology

## 2019-07-20 ENCOUNTER — Encounter: Payer: Self-pay | Admitting: Cardiology

## 2019-07-20 ENCOUNTER — Telehealth: Payer: Self-pay | Admitting: Cardiology

## 2019-07-20 ENCOUNTER — Ambulatory Visit: Payer: Medicare HMO | Admitting: Cardiology

## 2019-07-20 ENCOUNTER — Other Ambulatory Visit: Payer: Self-pay

## 2019-07-20 VITALS — BP 148/76 | HR 70 | Temp 98.0°F | Resp 20 | Ht 72.0 in | Wt 141.8 lb

## 2019-07-20 DIAGNOSIS — I255 Ischemic cardiomyopathy: Secondary | ICD-10-CM | POA: Diagnosis not present

## 2019-07-20 DIAGNOSIS — I25119 Atherosclerotic heart disease of native coronary artery with unspecified angina pectoris: Secondary | ICD-10-CM | POA: Diagnosis not present

## 2019-07-20 DIAGNOSIS — Z720 Tobacco use: Secondary | ICD-10-CM

## 2019-07-20 DIAGNOSIS — E785 Hyperlipidemia, unspecified: Secondary | ICD-10-CM | POA: Diagnosis not present

## 2019-07-20 DIAGNOSIS — Z8679 Personal history of other diseases of the circulatory system: Secondary | ICD-10-CM

## 2019-07-20 DIAGNOSIS — I10 Essential (primary) hypertension: Secondary | ICD-10-CM

## 2019-07-20 DIAGNOSIS — Z01810 Encounter for preprocedural cardiovascular examination: Secondary | ICD-10-CM

## 2019-07-20 DIAGNOSIS — I455 Other specified heart block: Secondary | ICD-10-CM | POA: Insufficient documentation

## 2019-07-20 DIAGNOSIS — I2102 ST elevation (STEMI) myocardial infarction involving left anterior descending coronary artery: Secondary | ICD-10-CM

## 2019-07-20 MED ORDER — LOSARTAN POTASSIUM 50 MG PO TABS: 50.0000 mg | ORAL_TABLET | Freq: Every day | ORAL | 6 refills | Status: DC

## 2019-07-20 MED ORDER — NITROGLYCERIN 0.4 MG SL SUBL: 0.4000 mg | SUBLINGUAL_TABLET | SUBLINGUAL | 3 refills | Status: DC | PRN

## 2019-07-20 NOTE — Telephone Encounter (Signed)
Patient was in today to see Dr. Steva Colder states he will call back after his surgery to schedule the appointment with the Pharm D to discuss his medications.  Dr. Allison Quarry covering nurse, Veronda Prude, LPN made aware

## 2019-07-20 NOTE — Progress Notes (Signed)
Primary Care Provider: [name], MD Cardiologist: Glenetta Hew, MD Electrophysiologist: None  Clinic Note: Chief Complaint  Patient presents with  . Hospitalization Follow-up    Multiple hospitalizations since November 2020  . AAA    Ended up having to have a redo repair with 3 procedures complicated by postop ileus  . Pre-op Exam    Upcoming right thoracotomy for right lower lobectomy  . Coronary Artery Disease    No angina     HPI:    Patrick Adams is a 74 y.o. 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 Memory Dance, annual scans  CAD history: Anterior-inferior ST elevation MI on January 31, 2018 -> cath revealed severe two-vessel disease with occluded p-m LAD as well as mRCA 80% & RPAV 90%.  01/31/2018: DES PCI LAD as initial STEMI PCI; Staged PCI RCA and RPA V on 02/03/2018  Patent by relook cath in December 2019 (for symptoms concerning for unstable angina-chest pressure/tightness/aching radiating to jaw lasting 45 minutes)--> LV gram EF up to 50 to 55% from 40 to 45%.  Unable to tolerate beta-blocker because of bradycardia (and now pauses)  HYPERLIPIDEMIA -, statin intolerant -- CVRR Pharm-D started L[place] -- NOT TAKING either statin or ZETIA  Patrick Adams was last seen on 12/13/2018 for follow-up of his event monitor to evaluate palpitations.  Monitor showed a few pauses that were  usually at nighttime and not symptomatic.  No symptoms.  There were 2 episodes were more than 4 seconds all the rest were last.  No symptoms of chronotropic incompetence.  (No beta-blocker because of his concern).  He had never started a statin, or Zetia.  He indicates that he talked with our clinical pharmacist, but never got started on anything because of his lack of desire.  No heart failure symptoms. --> He noted weight loss and some Brilinta related shortness of breath.  Also some bruising along with migraine headaches.  Plan was to reduce to 60 mg twice daily Brilinta in November, however we switched him to Plavix instead (based on cardiology consult from [facility]).  At that time was okay to hold for procedures.  -> Was to follow-up with [facility] to discuss blood pressure control and lipids --> discussion was to consider Repatha or Praluent, but he was not interested in self injections.  They then consider the possibility of inclisiran once it becomes available.  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 Sammuel Hines at [place] who referred him to [facility] cardiology for preop evaluation-saw Dr. Sabra Heck Who Felt That He Was Class III Risk, and only suggested potentially converting to Plavix from Brilinta because of shortness of breath of Brilinta.  Recent Hospitalizations:   02/22/2019 -> admitted for lumbar drain insertion  followed by aneurysmal repair 4V FEVAR (CA, SMA, RRA, LRA - Cook Branced Device - Bilateral Femoral Access & L Brachial Cutdown).  Complained about right flank pain -> CT imaging demonstrated IMH -> plan was 48-month follow-up CT scan.  05/07/2019 -> [facility], ER for abdominal pain and constipation.  CTA showed new aneurysmal findings/type Ia endoleak.  Transfer to [facility] February  7.  TEVAR on 2/8 --> followed by Type III Endoleak --> taken back for proximal cuff extension TEVAR  Complicated by ileus =-> treated with TPN.  Noted 40 pound weight loss since diverticulitis admission in May 2020 -->   Incidental finding on CT of the chest showed a 2.6 cm right lower lobe nodule concerning for malignancy.  05/19/2019: [facility] ER-admission: Postop ileus with partial SBO -> NG tube placed with IV fluid administered.  Diet slowly progressed.  --  07/12/2019-seen by Dr. Rollene Rotunda [facility]) for evaluation of lung nodule, CT guided biopsy-proven NS CLC stage I right lung.  Recommended surgical excision with radiation therapy  07/19/2019: Dr. Maryelizabeth Rowan [facility] ([address]  [place], [place] [address], fax [phone])  Noted right lower lobe mass on CT with possible N1 lymph node disease but no evidence of N2 or mets disease.  Stage I-IIa at most. ->  Recommended right lower lobectomy via thoracotomy (however may need bilobed back to me for adequate surgical margin if there is hilar involvement); plan is to wait till after his birthday.->  Scheduled for May 12.  Recommended to stop Plavix 1 week prior to surgery.  Obviously, smoking cessation recommended.  Reviewed  CV studies:    The following studies were reviewed today: (if available, images/films reviewed: From Epic Chart or Care Everywhere) . Transthoracic Echo ([place]): 01/17/2019: EF 55%-normal wall motion..  Mild RV dilation.  Aortic sclerosis but no stenosis.   01/13/2019 PFTs [facility]): FEV1 3.72 and FVC of 4.81, and FEV1/FVC=77%. A room air arterial blood gas at that time was 7.41/35/101/22/-2/98%   02/23/2019: 4-vessel branched FEVAR (Dr. Sammuel Hines [facility]) -follow-up renal mesenteric duplex 03/11/2019 indicated patent vessels and stents with no concerning findings. Noted extension of aneurysm to thoracic aorta   05/09/2019: TEVAR - COOK Alpha  34 x 34 x 209 (RFA); 2/11 - prox cuff extension TEVAR (Gore 37 mm x 15 mm, LCFA).   Interval History:   Patrick Adams returns here today for what should have been his routine 5-month follow-up, but has had very runoff 6 months that I reviewed.  Multiple different hospitalizations reviewed along with procedures and echocardiograms etc.  Well over an hour spent total on the chart.  Patrick Adams from a cardiac standpoint seems to be doing really well.  Despite having all of these procedures and then postop ileus etc., he has not had any problems at all with cardiac issues.  No chest pain or pressure with rest or exertion.  He does have some baseline exertional dyspnea related to smoking, but has not had a change in that since prior to his MI.  He is not having any more the abdominal or back pain/flank pain he was having after his initial FEVAR procedure after the follow-up TEVAR procedures.  No longer have any ileus symptoms.  He is pretty active and is able to achieve well over 4 METS walking or doing chores.  He denies any resting exertional chest pain or pressure.  No heart failure symptoms of PND, orthopnea or edema.  CV Review of Symptoms (Summary) Cardiovascular ROS: positive for - shortness of breath and His mild baseline dyspnea. negative for - chest pain, dyspnea on exertion, edema, irregular heartbeat, loss of consciousness, orthopnea, palpitations, paroxysmal nocturnal dyspnea, rapid heart rate, shortness of breath or Syncope/near syncope, TIA/amaurosis fugax, claudication  The patient does not have symptoms concerning for COVID-19 infection (fever, chills, cough, or new shortness of breath).  The patient is practicing social distancing & Masking.   He tells me: " To be honest, I have not had time to think about Covid vaccine" -> has been in and out of the hospital too much and is scared to take a shot that may make him feel worse.  REVIEWED OF SYSTEMS   Review of Systems   Constitutional: Positive for malaise/fatigue (Since slowly gaining energy back.  Is hoping that he gets closer to baseline prior to his upcoming surgery.) and weight loss (Has lost close to 60 pounds since last May.  Able to gain 10 pounds back since his most recent surgery and ileus.).  Respiratory: Positive for shortness of breath (No change from baseline).   Cardiovascular: Negative for leg swelling.  Gastrointestinal: Negative for blood in stool and melena.  Genitourinary: Negative for hematuria.  Musculoskeletal: Negative for falls and joint pain.  Neurological: Positive for dizziness (Only if he stands up really quickly.  Not all the time). Negative for focal weakness and weakness.  Psychiatric/Behavioral: Negative for memory loss. The patient does not have insomnia.    I have reviewed and (if needed) personally updated the patient's problem list, medications, allergies, past medical and surgical history, social and family history.   PAST MEDICAL HISTORY   Past Medical History:  Diagnosis Date  . CAD S/P PCI 01/2018   a) 01/31/2018: p-mLAD 60-100% (DES PCI with 2.75 mm x 38 mm Synergy DES - 4.0-3.6.3.0 mm tapered post-dilation); b) p-mRCA 55%-85%-40% - Synergy DES 3.5 mm x 32 mm -> 4.0 mm; & RPAV 90% --> Synergy DES 2.5 mm x 12 mm -> 3.0 mm;; c) stents patent on re-look Cath 03/19/2018  . Diverticulitis   . Erectile dysfunction   . Hemochromatosis   . Hyperlipidemia   . Hypertension   . Ischemic cardiomyopathy 01/31/2018   Initial post STEMI EF by echo 40 to 45% --> by cath 1 month later LV gram showed EF 55 to 60%.  . MDD (major depressive disorder)   . Nephrolithiasis   . Non-small cell lung cancer, right ([place]) 05/2019   Proven by CT-guided biopsy-stage I  . PAD (peripheral artery disease) ([place])   . Prostate nodule   . SBO (small bowel obstruction) ([place]) 05/2019   Mostly postop ileus  . ST elevation (STEMI) myocardial infarction involving left anterior descending coronary  artery ([place]) 01/31/2018   Found to have pLAD 60%-mLAD 100% (DES PCI), distal LAD diffuse 60-70% (med Rx), OM1 65%; also signifiicant p-m RCA & RPAV disease  --> Initial EF ~45% with elevated LVEDP  . 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 Memory Dance, annual scans -> until summer/fall 2021 -> noted progression to 6.2 mm aneurysm --> 11/25/221 [facility]- Dr. Sammuel Hines): 4 V branched FEVAR (CA,SMA, R&L Renal) -> 05/09/19: Type 1a Endoleak  - TEVAR & f/u TEVAR cuff Extension (2/11)    PAST SURGICAL HISTORY   Past Surgical History:  Procedure Laterality Date  . ABDOMINAL AORTIC ANEURYSM REPAIR  2008   Aortobiiliac repair ->  Dr. Memory Dance, annual scans  . CARDIAC EVENT MONITOR  10/2018   Baseline sinus rhythm with 1 degree AVB.  PVCs noted.  Average heart rate 73 bpm.  3 critical: 1 serious and 8 stable events noted: Critical-4.0-second pauses x3, serious 1 run 6 beat PVCs (NSVT), 3 pauses of 3.2 to 3.5 seconds.  Pauses do have P waves that appear to be blocked on some occasions.  Both junctional escape and normal sinus beats after pauses.  . COLONOSCOPY  2016  . CORONARY STENT INTERVENTION N/A 02/03/2018   Procedure: CORONARY STENT INTERVENTION;  Surgeon: Belva Crome, MD;  Location: [facility] INVASIVE CV LAB;;   staged PCI p-mRCA (Synergy DES 3.5 mm x 32 mm--4.0 mm covering entire diseased segment from  proximal 55 through the 85 and 40% lesions in the mid vessel), RPAV (Synergy DES 2.5 mm x 12 mm--3.0 mm)  . CORONARY/GRAFT ACUTE MI REVASCULARIZATION N/A 01/31/2018   Procedure: Coronary/Graft Acute MI Revascularization;  Surgeon: Leonie Man, MD;  Location: [place] CV LAB;; ANTERIOR-INFERIOR STEMI: pLAD 60% - mLAD 100% (PCI-Synergy DES 2.75 mm at 38 mm--4.1-3.6-3.0 mm tapered post dilation)  . LEFT HEART CATH AND CORONARY ANGIOGRAPHY N/A 01/31/2018   Procedure: LEFT HEART CATH AND CORONARY ANGIOGRAPHY;   Surgeon: Leonie Man, MD;  Location: [facility] INVASIVE CV LAB;;; (anterior inferior STEMI) -> pLAD 60% - mLAD 100% (DES PCI); p-m RCA 55%, mRCA 85%, mRCA 40% (diffuse segmental disease), RPAV 90% (ulcerated), OM1 65%.  EF 35-45%, moderately elevated LVEDP --acute combined systolic and diastolic heart failure, ischemic cardiomyopathy  . LEFT HEART CATH AND CORONARY ANGIOGRAPHY N/A 02/03/2018   Procedure: LEFT HEART CATH AND CORONARY ANGIOGRAPHY;  Surgeon: Belva Crome, MD;  Location: [place] CV LAB;  Service: Cardiovascular;  Laterality: N/A;  . LEFT HEART CATH AND CORONARY ANGIOGRAPHY N/A 03/19/2018   Procedure: LEFT HEART CATH AND CORONARY ANGIOGRAPHY;  Surgeon: Martinique, Peter M, MD;  Location: [place] CV LAB;  Service: Cardiovascular;  Patent stent in p-mLAD, patent p-m RCA stent and RPAV stent.  Distal LAD 70%, OM1 65%.  EF 55-60%.  . TEVAR  2/8 & 05/12/2019   [facility]-Dr. Sammuel Hines: (for type I a endoleak of FEVAR from 01/2019)  05/02/2019: TEVAR - COOK Alpha 34 x 34 x 209 (RFA); 05/12/19 - prox cuff extension TEVAR (Gore 37 mm x 15 mm, LCFA).  Clydia Llano AORTIC ANEURYSM REPAIR  02/23/2019   [facility]-Dr. Sammuel Hines: admitted for lumbar drain insertion followed by aneurysmal repair 4V FEVAR (CA, SMA, RRA, LRA - Cook Branced Device - Bilateral Femoral Access & L Brachial Cutdown).  . TRANSTHORACIC ECHOCARDIOGRAM  02/01/2018   (Insetting of anterior-inferior STEMI) --> moderate LVH.  EF 40 to 45%.  Anterior, anteroseptal and apical severe hypokinesis to akinesis.  Indeterminate LV filling pressures.  Aortic sclerosis, no stenosis.  . TRANSTHORACIC ECHOCARDIOGRAM  01/17/2019   [facility]: 01/17/2019: EF 55%-normal wall motion..  Mild RV dilation.  Aortic sclerosis but no stenosis   01/31/2018 - LAD, 02/03/2018 mRCA & RPAV     MEDICATIONS/ALLERGIES   Current Meds  Medication Sig  . aspirin EC 81 MG tablet Take 81 mg by mouth at bedtime.  . cholecalciferol (VITAMIN D3) 25 MCG (1000 UT) tablet Take  1,000 Units by mouth daily.  . clopidogrel (PLAVIX) 75 MG tablet Take 1 tablet (75 mg total) by mouth daily. (Patient taking differently: Take 75 mg by mouth at bedtime. )  . losartan (COZAAR) 50 MG tablet Take 1 tablet (50 mg  total) by mouth daily.  . nitroGLYCERIN (NITROSTAT) 0.4 MG SL tablet Place 1 tablet (0.4 mg total) under the tongue every 5 (five) minutes as needed for chest pain.  . polyethylene glycol (MIRALAX / GLYCOLAX) 17 g packet Take 17 g by mouth daily as needed (Constipation).  . [DISCONTINUED] losartan (COZAAR) 25 MG tablet TAKE 1 TABLET BY MOUTH EVERY DAY  . [DISCONTINUED] nitroGLYCERIN (NITROSTAT) 0.4 MG SL tablet Place 1 tablet (0.4 mg total) under the tongue every 5 (five) minutes as needed for chest pain.    Allergies  Allergen Reactions  . Brilinta [Ticagrelor] Shortness Of Breath     Patient was taking 60 mg  Twice a day - changed to Clopidogrel 75 mg  03/02/19  . Ciprofloxacin Anaphylaxis  . Statins Other (See Comments)    Joint pain.    SOCIAL HISTORY/FAMILY HISTORY   Reviewed in Epic:  Pertinent findings: no change  OBJCTIVE -PE, EKG, labs   Wt Readings from Last 3 Encounters:  07/20/19 141 lb 12.8 oz (64.3 kg)  06/16/19 136 lb 3.2 oz (61.8 kg)  05/18/19 144 lb (65.3 kg)  12/13/2018: Weight 164 pounds.  Physical Exam: BP (!) 148/76   Pulse 70   Temp 98 F (36.7 C)   Resp 20   Ht 6' (1.829 m)   Wt 141 lb 12.8 oz (64.3 kg)   SpO2 98%   BMI 19.23 kg/m  Physical Exam  Constitutional: He is oriented to person, place, and time. He appears well-developed and well-nourished. No distress.  Notably lighter, but not frail.  Chronically ill-appearing gentleman.  Well-groomed.  HENT:  Head: Normocephalic and atraumatic.  Neck: No hepatojugular reflux and no JVD present. Carotid bruit is not present (Cannot exclude soft bruit.).  Cardiovascular: Normal rate, regular rhythm and normal heart sounds.  Occasional extrasystoles are present. PMI is not displaced.  Exam reveals decreased pulses (Intact, but decreased pedal pulses.). Exam reveals no gallop and no friction rub.  No murmur heard. Pulmonary/Chest: Effort normal. No respiratory distress. He has no wheezes. He has no rales (Mild right basal crackles). He exhibits no tenderness.  Distant breath sounds but clear to auscultation.  No wheezes rales rhonchi.  Interstitial lung sounds heard.  Increased AP diameter.  Abdominal: Soft. Bowel sounds are normal. He exhibits no distension. There is no abdominal tenderness. There is no rebound.  Musculoskeletal:        General: No edema. Normal range of motion.     Cervical back: Normal range of motion and neck supple.  Neurological: He is alert and oriented to person, place, and time.  Psychiatric: He has a normal mood and affect. His behavior is normal. Judgment and thought content normal.  Vitals reviewed.   Adult ECG Report Not checked  Recent Labs: Due to recheck in June Lab Results  Component Value Date   CHOL 224 (H) 12/22/2018   HDL 46 12/22/2018   LDLCALC 146 (H) 12/22/2018   TRIG 175 (H) 12/22/2018   CHOLHDL 4.9 12/22/2018   Lab Results  Component Value Date   CREATININE 1.03 05/24/2019   BUN 12 05/24/2019   NA 131 (L) 05/24/2019   K 3.8 05/24/2019   CL 97 (L) 05/24/2019   CO2 22 05/24/2019   No results found for: TSH  ASSESSMENT/PLAN    Problem List Items Addressed This Visit    Coronary artery disease involving native coronary artery of native heart with angina pectoris ([place]) - Primary (Chronic)    Has not actually had  any further chest pain concerning for angina since December 2019 when a relook cath showed stable patent stents.   No longer on nitrates and not requiring any sublingual nitroglycerin. He has been maintained on ARB which we will increase the dose today.   Not on beta-blocker because of history of bradycardia, lung disease/COPD and history of sinus pauses. Not currently on statin because of intolerance.  We  will recheck lipids in June following his surgery and have him follow-up with our lipid clinic to discuss future options.  He is on Plavix for maintenance purposes based on his LAD and 2 RCA stents, but I think that permission about stopping the Plavix would be deferred to the vascular surgeon who is placed his endovascular stents. From a cardiac standpoint, okay to hold Plavix 5 to 7 days preop surgery.      Relevant Medications   nitroGLYCERIN (NITROSTAT) 0.4 MG SL tablet   losartan (COZAAR) 50 MG tablet   Other Relevant Orders   ECHOCARDIOGRAM COMPLETE   Lipid Profile   Ischemic cardiomyopathy (Chronic)    EF recovered by LV gram shortly after his PCI, and then this was confirmed by echo in October 2020.  When I saw him in clinic, I was not aware of the complete work-up done at [facility].  I do think it is reasonable to reassess with an echocardiogram just to give a new baseline of his systolic and diastolic function prior to lung surgery.  Plan: Check 2D echocardiogram      Relevant Medications   nitroGLYCERIN (NITROSTAT) 0.4 MG SL tablet   losartan (COZAAR) 50 MG tablet   Other Relevant Orders   ECHOCARDIOGRAM COMPLETE   Essential hypertension (Chronic)    Blood pressure is a little higher today than it had been.    Plan:  increase losartan to 50 mg daily      Relevant Medications   nitroGLYCERIN (NITROSTAT) 0.4 MG SL tablet   losartan (COZAAR) 50 MG tablet   Other Relevant Orders   Lipid Profile   Hyperlipidemia with target LDL less than 70 (Chronic)    Pretty much he is against any oral medication.  I do not think that Nexletol alone would be of benefit at all.  He did not want to take any statins or Zetia.  He is due for relook lipids.  He had thought about the possibility of using inclisiran for his lipids, we will have lipids checked following his lung surgery and then have him follow-up with CVRR to discuss potential initiation of therapy inclisiran versus PCSK9  inhibitor.      Relevant Medications   nitroGLYCERIN (NITROSTAT) 0.4 MG SL tablet   losartan (COZAAR) 50 MG tablet   Other Relevant Orders   Lipid Profile   Tobacco abuse (Chronic)    We again reiterated the importance of smoking cessation.  He tells me he is now down to smoking 1 cigarette after each meal but has not yet been able to make that final step.  Not ready to quit.      Acute ST elevation myocardial infarction (STEMI) involving left anterior descending (LAD) coronary artery [facility])    He is now 18 months out from his STEMI.  Ever since the relook cath in December 2020, he has not had any further episodes of chest pain or pressure. He notes that the dyspnea has improved significantly since being off Brilinta and is on Plavix.  He has had a follow-up echocardiogram done last fall that showed preserved  EF, but has not had significant procedures since.  However he is not having any heart failure symptoms.      Relevant Medications   nitroGLYCERIN (NITROSTAT) 0.4 MG SL tablet   losartan (COZAAR) 50 MG tablet   History of abdominal aortic aneurysm (AAA) repair    Now status post extensive endovascular repair ranging from the descending thoracic aorta through to both iliac arteries.  The thoracoabdominal section was treated with 4 Branch FEVAR (CA, SMA, & Bilateral RA) to ensure splanchnic and renal perfusion.  No sign of lumbar ischemia.  I would defer decision about holding Plavix to Dr. Sammuel Hines, however I suspect that with large diameter stents he would be fine holding Plavix for any surgery.      Sinus pause    Last year he was noted on the monitor to have intermittent bradycardia and sinus pauses usually during sleeping hours.  Not felt to be significant enough for pacemaker, but it is of concern. He is not on beta-blocker because of bradycardia issues and the concern for the fact that he had some pauses on the monitor at night. --> I will send this note to Dr. Donah Driver so that  he will be aware of the concern for possible bradycardia and pauses during surgery with a heart manipulation.  May potentially benefit from prophylactic placement of a temporary pacemaker wire.       Relevant Medications   nitroGLYCERIN (NITROSTAT) 0.4 MG SL tablet   losartan (COZAAR) 50 MG tablet   Preoperative cardiovascular examination    Patrick Adams clearly has a history of CAD/ischemic heart disease that is been revascularized and is completely asymptomatic.  He had a history of reduced EF and we are reevaluating just to ensure that it is now back to baseline.  No active heart failure symptoms. No history of stroke/cerebrovascular disease, normal renal function, and nondiabetic.  He has an upcoming thoracotomy with lobectomy which is intrathoracic and therefore high risk surgery.  Based on the RCS high risk he would be class II-III risk ranging from 6.0 to 10% risk of adverse cardiac events simply because of the fact that he has had surgery and does have CAD.  The fact that he is asymptomatic, and able to achieve well over 4 METS would suggest that he is closer to the 6% risk.  In the absence of active symptoms, would not recommend any further ischemic evaluation.  We are can check a 2D echo to reestablish cardiac function preop.  Provided this is relatively stable, no further testing required.  Additional important information is the fact that he has had issues with sinus pauses on event monitor during sleep hours.  A couple of those pauses were at least 4 seconds.  Would need to be wary of this perioperatively.  Will defer to anesthesiology, but may need to consider being prepared for percutaneous or transvenous temporary pacemaking if the heart is to be manipulated.  This risk is relatively low, but needs to be identified.  It should not make surgery prohibitive at all.          COVID-19 Education: The signs and symptoms of COVID-19 were discussed with the patient and how to seek care  for testing (follow up with PCP or arrange E-visit).   The importance of social distancing and COVID-19 vaccination was discussed today.  I spent a total of 25 minutes with the patient. >  50% of the time was spent in direct patient consultation.  Additional time spent with chart  review  / charting (studies, outside notes, etc): 40 --> The patient has had 3 endovascular procedures, cardiology consultation with echocardiogram performed by outside physicians and now is here for preoperative evaluation and I have not seen him in 6 months.  All of this is happened in the interim.  Extensive chart review using Care Everywhere, updating past medical history etc. Total Time: 65 min   Current medicines are reviewed at length with the patient today.  (+/- concerns) okay to hold Plavix for surgery  Notice: This dictation was prepared with Dragon dictation along with smaller phrase technology. Any transcriptional errors that result from this process are unintentional and may not be corrected upon review.  Patient Instructions / Medication Changes & Studies & Tests Ordered   Patient Instructions  Medication Instructions:  INCREASE LOSARTAN 50MG  DAILY-MAY TAKE 2 OF EXISTING RX DAILY UNTIL GONE *If you need a refill on your cardiac medications before your next appointment, please call your pharmacy*  Lab Work: [place] If you have labs (blood work) drawn today and your tests are completely normal, you will receive your results only by:  [place] (if you have MyChart) OR A paper copy in the mail.  If you have any lab test that is abnormal or we need to change your treatment, we will call you to review the results. You may go to any [facility] that is convenient for you however, we do have a lab in our office that is able to assist you. You DO NOT need an appointment for our lab. The lab is open 8:00am and closes at 4:00pm. Lunch 12:45 - 1:45pm.  Testing/Procedures: Echocardiogram - Your  physician has requested that you have an echocardiogram. Echocardiography is a painless test that uses sound waves to create images of your heart. It provides your doctor with information about the size and shape of your heart and how well your heart's chambers and valves are working. This procedure takes approximately one hour. There are no restrictions for this procedure. This will be performed at our [facility] location - [address], [address].  Special Instructions CLEARED FOR UPCOMING SURGERY PROVIDED ECHO COME BACK NORMAL.  Follow-Up: Your next appointment:  NOVEMBER  Please call our office 2 months in advance to schedule this appointment In Person with Glenetta Hew, MD   REFER TO [facility] TO DISCUSS MEDICATIONS/LABS THEN END OF June(AFTER SURGERY).  At [facility], you and your health needs are our priority.  As part of our continuing mission to provide you with exceptional heart care, we have created designated Provider Care Teams.  These Care Teams include your primary Cardiologist (physician) and Advanced Practice Providers (APPs -  Physician Assistants and Nurse Practitioners) who all work together to provide you with the care you need, when you need it.  We recommend signing up for the patient portal called "MyChart".  Sign up information is provided on this After Visit Summary.  MyChart is used to connect with patients for Virtual Visits (Telemedicine).  Patients are able to view lab/test results, encounter notes, upcoming appointments, etc.  Non-urgent messages can be sent to your provider as well.   To learn more about what you can do with MyChart, go to [email or web address].         Studies Ordered:   Orders Placed This Encounter  Procedures  . Lipid Profile  . ECHOCARDIOGRAM COMPLETE     Glenetta Hew, M.D., M.S. Interventional Cardiologist   Pager # [phone] Phone #  [place] [place], [place] [address]   Thank you for  choosing Heartcare at [facility]!!

## 2019-07-20 NOTE — Patient Instructions (Signed)
Medication Instructions:  INCREASE LOSARTAN 50MG  DAILY-MAY TAKE 2 OF EXISTING RX DAILY UNTIL GONE *If you need a refill on your cardiac medications before your next appointment, please call your pharmacy*  Lab Work: [place] If you have labs (blood work) drawn today and your tests are completely normal, you will receive your results only by:  [place] (if you have MyChart) OR A paper copy in the mail.  If you have any lab test that is abnormal or we need to change your treatment, we will call you to review the results. You may go to any [facility] that is convenient for you however, we do have a lab in our office that is able to assist you. You DO NOT need an appointment for our lab. The lab is open 8:00am and closes at 4:00pm. Lunch 12:45 - 1:45pm.  Testing/Procedures: Echocardiogram - Your physician has requested that you have an echocardiogram. Echocardiography is a painless test that uses sound waves to create images of your heart. It provides your doctor with information about the size and shape of your heart and how well your heart's chambers and valves are working. This procedure takes approximately one hour. There are no restrictions for this procedure. This will be performed at our [facility] location - [address].  Special Instructions CLEARED FOR UPCOMING SURGERY PROVIDED ECHO COME BACK NORMAL.  Follow-Up: Your next appointment:  NOVEMBER  Please call our office 2 months in advance to schedule this appointment In Person with Glenetta Hew, MD   REFER TO [facility] TO DISCUSS MEDICATIONS/LABS THEN END OF June(AFTER SURGERY).  At [facility], you and your health needs are our priority.  As part of our continuing mission to provide you with exceptional heart care, we have created designated Provider Care Teams.  These Care Teams include your primary Cardiologist (physician) and Advanced Practice Providers (APPs -  Physician Assistants and Nurse Practitioners)  who all work together to provide you with the care you need, when you need it.  We recommend signing up for the patient portal called "MyChart".  Sign up information is provided on this After Visit Summary.  MyChart is used to connect with patients for Virtual Visits (Telemedicine).  Patients are able to view lab/test results, encounter notes, upcoming appointments, etc.  Non-urgent messages can be sent to your provider as well.   To learn more about what you can do with MyChart, go to [email or web address].

## 2019-07-21 ENCOUNTER — Encounter: Payer: Self-pay | Admitting: Cardiology

## 2019-07-21 NOTE — Assessment & Plan Note (Signed)
We again reiterated the importance of smoking cessation.  He tells me he is now down to smoking 1 cigarette after each meal but has not yet been able to make that final step.  Not ready to quit.

## 2019-07-21 NOTE — Assessment & Plan Note (Signed)
He is now 18 months out from his STEMI.  Ever since the relook cath in December 2020, he has not had any further episodes of chest pain or pressure. He notes that the dyspnea has improved significantly since being off Brilinta and is on Plavix.  He has had a follow-up echocardiogram done last fall that showed preserved EF, but has not had significant procedures since.  However he is not having any heart failure symptoms.

## 2019-07-21 NOTE — Assessment & Plan Note (Addendum)
Has not actually had any further chest pain concerning for angina since December 2019 when a relook cath showed stable patent stents.   No longer on nitrates and not requiring any sublingual nitroglycerin. He has been maintained on ARB which we will increase the dose today.   Not on beta-blocker because of history of bradycardia, lung disease/COPD and history of sinus pauses. Not currently on statin because of intolerance.  We will recheck lipids in June following his surgery and have him follow-up with our lipid clinic to discuss future options.  He is on Plavix for maintenance purposes based on his LAD and 2 RCA stents, but I think that permission about stopping the Plavix would be deferred to the vascular surgeon who is placed his endovascular stents. From a cardiac standpoint, okay to hold Plavix 5 to 7 days preop surgery.

## 2019-07-21 NOTE — Assessment & Plan Note (Signed)
Last year he was noted on the monitor to have intermittent bradycardia and sinus pauses usually during sleeping hours.  Not felt to be significant enough for pacemaker, but it is of concern. He is not on beta-blocker because of bradycardia issues and the concern for the fact that he had some pauses on the monitor at night. --> I will send this note to Dr. Donah Driver so that he will be aware of the concern for possible bradycardia and pauses during surgery with a heart manipulation.  May potentially benefit from prophylactic placement of a temporary pacemaker wire.

## 2019-07-21 NOTE — Assessment & Plan Note (Signed)
Patrick Adams clearly has a history of CAD/ischemic heart disease that is been revascularized and is completely asymptomatic.  He had a history of reduced EF and we are reevaluating just to ensure that it is now back to baseline.  No active heart failure symptoms. No history of stroke/cerebrovascular disease, normal renal function, and nondiabetic.  He has an upcoming thoracotomy with lobectomy which is intrathoracic and therefore high risk surgery.  Based on the RCS high risk he would be class II-III risk ranging from 6.0 to 10% risk of adverse cardiac events simply because of the fact that he has had surgery and does have CAD.  The fact that he is asymptomatic, and able to achieve well over 4 METS would suggest that he is closer to the 6% risk.  In the absence of active symptoms, would not recommend any further ischemic evaluation.  We are can check a 2D echo to reestablish cardiac function preop.  Provided this is relatively stable, no further testing required.  Additional important information is the fact that he has had issues with sinus pauses on event monitor during sleep hours.  A couple of those pauses were at least 4 seconds.  Would need to be wary of this perioperatively.  Will defer to anesthesiology, but may need to consider being prepared for percutaneous or transvenous temporary pacemaking if the heart is to be manipulated.  This risk is relatively low, but needs to be identified.  It should not make surgery prohibitive at all.

## 2019-07-21 NOTE — Assessment & Plan Note (Signed)
Pretty much he is against any oral medication.  I do not think that Nexletol alone would be of benefit at all.  He did not want to take any statins or Zetia.  He is due for relook lipids.  He had thought about the possibility of using inclisiran for his lipids, we will have lipids checked following his lung surgery and then have him follow-up with CV[facility] to discuss potential initiation of therapy inclisiran versus PCSK9 inhibitor.

## 2019-07-21 NOTE — Assessment & Plan Note (Signed)
EF recovered by LV gram shortly after his PCI, and then this was confirmed by echo in October 2020.  When I saw him in clinic, I was not aware of the complete work-up done at [facility].  I do think it is reasonable to reassess with an echocardiogram just to give a new baseline of his systolic and diastolic function prior to lung surgery.  Plan: Check 2D echocardiogram

## 2019-07-21 NOTE — Assessment & Plan Note (Signed)
Now status post extensive endovascular repair ranging from the descending thoracic aorta through to both iliac arteries.  The thoracoabdominal section was treated with 4 Branch FEVAR (CA, SMA, & Bilateral RA) to ensure splanchnic and renal perfusion.  No sign of lumbar ischemia.  I would defer decision about holding Plavix to Dr. Sammuel Hines, however I suspect that with large diameter stents he would be fine holding Plavix for any surgery.

## 2019-07-21 NOTE — Assessment & Plan Note (Signed)
Blood pressure is a little higher today than it had been.    Plan:  increase losartan to 50 mg daily

## 2019-07-30 HISTORY — PX: TRANSTHORACIC ECHOCARDIOGRAM: SHX275

## 2019-08-03 ENCOUNTER — Ambulatory Visit (HOSPITAL_COMMUNITY): Payer: Medicare HMO | Attending: Cardiology

## 2019-08-03 ENCOUNTER — Other Ambulatory Visit: Payer: Self-pay

## 2019-08-03 DIAGNOSIS — I255 Ischemic cardiomyopathy: Secondary | ICD-10-CM

## 2019-08-03 DIAGNOSIS — I25119 Atherosclerotic heart disease of native coronary artery with unspecified angina pectoris: Secondary | ICD-10-CM | POA: Diagnosis not present

## 2019-08-12 MED ORDER — MAGNESIUM HYDROXIDE 400 MG/5ML PO SUSP
30.00 | ORAL | Status: DC
Start: 2019-08-12 — End: 2019-08-12

## 2019-08-12 MED ORDER — BISACODYL 10 MG RE SUPP
10.00 | RECTAL | Status: DC
Start: 2019-08-13 — End: 2019-08-12

## 2019-08-12 MED ORDER — SIMETHICONE 80 MG PO CHEW
160.00 | CHEWABLE_TABLET | ORAL | Status: DC
Start: ? — End: 2019-08-12

## 2019-08-12 MED ORDER — GENERIC EXTERNAL MEDICATION
0.04 | Status: DC
Start: ? — End: 2019-08-12

## 2019-08-12 MED ORDER — CYCLOBENZAPRINE HCL 10 MG PO TABS
5.00 | ORAL_TABLET | ORAL | Status: DC
Start: ? — End: 2019-08-12

## 2019-08-12 MED ORDER — CITRATE OF MAGNESIA PO SOLN
150.00 | ORAL | Status: DC
Start: 2019-08-13 — End: 2019-08-12

## 2019-08-12 MED ORDER — GABAPENTIN 300 MG PO CAPS
300.00 | ORAL_CAPSULE | ORAL | Status: DC
Start: 2019-08-12 — End: 2019-08-12

## 2019-08-12 MED ORDER — FENTANYL-BUPIVACAINE-NACL 0.5-0.1-0.9 MG/250ML-% EP SOLN
6.00 | EPIDURAL | Status: DC
Start: ? — End: 2019-08-12

## 2019-08-12 MED ORDER — GENERIC EXTERNAL MEDICATION
Status: DC
Start: ? — End: 2019-08-12

## 2019-08-12 MED ORDER — ASPIRIN 81 MG PO TBEC
81.00 | DELAYED_RELEASE_TABLET | ORAL | Status: DC
Start: 2019-08-13 — End: 2019-08-12

## 2019-08-12 MED ORDER — LABETALOL HCL 5 MG/ML IV SOLN
20.00 | INTRAVENOUS | Status: DC
Start: ? — End: 2019-08-12

## 2019-08-12 MED ORDER — ALPRAZOLAM 0.25 MG PO TABS
0.50 | ORAL_TABLET | ORAL | Status: DC
Start: ? — End: 2019-08-12

## 2019-08-12 MED ORDER — ALPRAZOLAM 0.25 MG PO TABS
0.25 | ORAL_TABLET | ORAL | Status: DC
Start: ? — End: 2019-08-12

## 2019-08-12 MED ORDER — KCL IN DEXTROSE-NACL 20-5-0.45 MEQ/L-%-% IV SOLN
75.00 | INTRAVENOUS | Status: DC
Start: ? — End: 2019-08-12

## 2019-08-12 MED ORDER — DEXMEDETOMIDINE HCL IN NACL 400 MCG/100ML IV SOLN
0.10 | INTRAVENOUS | Status: DC
Start: ? — End: 2019-08-12

## 2019-08-12 MED ORDER — ONDANSETRON HCL 4 MG/2ML IJ SOLN
4.00 | INTRAMUSCULAR | Status: DC
Start: ? — End: 2019-08-12

## 2019-08-12 MED ORDER — PHENYLEPHRINE HCL-NACL 20-0.9 MG/250ML-% IV SOLN
1.00 | INTRAVENOUS | Status: DC
Start: ? — End: 2019-08-12

## 2019-08-12 MED ORDER — NALBUPHINE HCL 10 MG/ML IJ SOLN
2.00 | INTRAMUSCULAR | Status: DC
Start: ? — End: 2019-08-12

## 2019-08-12 MED ORDER — LABETALOL HCL 5 MG/ML IV SOLN
5.00 | INTRAVENOUS | Status: DC
Start: ? — End: 2019-08-12

## 2019-08-12 MED ORDER — TRAMADOL HCL 50 MG PO TABS
100.00 | ORAL_TABLET | ORAL | Status: DC
Start: ? — End: 2019-08-12

## 2019-08-12 MED ORDER — HYDROCODONE-ACETAMINOPHEN 10-325 MG PO TABS
1.00 | ORAL_TABLET | ORAL | Status: DC
Start: ? — End: 2019-08-12

## 2019-08-12 MED ORDER — DSS 100 MG PO CAPS
100.00 | ORAL_CAPSULE | ORAL | Status: DC
Start: 2019-08-12 — End: 2019-08-12

## 2019-08-12 MED ORDER — ALUM & MAG HYDROXIDE-SIMETH 200-200-20 MG/5ML PO SUSP
30.00 | ORAL | Status: DC
Start: ? — End: 2019-08-12

## 2019-08-12 MED ORDER — CHLORHEXIDINE GLUCONATE 0.12 % MT SOLN
15.00 | OROMUCOSAL | Status: DC
Start: 2019-08-12 — End: 2019-08-12

## 2019-08-12 MED ORDER — METOCLOPRAMIDE HCL 5 MG/ML IJ SOLN
10.00 | INTRAMUSCULAR | Status: DC
Start: ? — End: 2019-08-12

## 2019-08-12 MED ORDER — POLYETHYLENE GLYCOL 3350 17 GM/SCOOP PO POWD
17.00 | ORAL | Status: DC
Start: 2019-08-12 — End: 2019-08-12

## 2019-08-12 MED ORDER — ACETAMINOPHEN 500 MG PO TABS
500.00 | ORAL_TABLET | ORAL | Status: DC
Start: ? — End: 2019-08-12

## 2019-08-12 MED ORDER — MUPIROCIN 2 % EX OINT
0.25 | TOPICAL_OINTMENT | CUTANEOUS | Status: DC
Start: 2019-08-12 — End: 2019-08-12

## 2020-01-17 ENCOUNTER — Other Ambulatory Visit: Payer: Self-pay | Admitting: Cardiology

## 2020-02-06 HISTORY — PX: OTHER SURGICAL HISTORY: SHX169

## 2020-02-28 ENCOUNTER — Other Ambulatory Visit: Payer: Self-pay | Admitting: Cardiology

## 2020-03-14 ENCOUNTER — Other Ambulatory Visit: Payer: Self-pay

## 2020-03-14 ENCOUNTER — Ambulatory Visit: Payer: Medicare HMO | Admitting: Cardiology

## 2020-03-14 ENCOUNTER — Encounter: Payer: Self-pay | Admitting: Cardiology

## 2020-03-14 VITALS — BP 120/70 | HR 69 | Ht 72.0 in | Wt 157.0 lb

## 2020-03-14 DIAGNOSIS — Z720 Tobacco use: Secondary | ICD-10-CM

## 2020-03-14 DIAGNOSIS — I10 Essential (primary) hypertension: Secondary | ICD-10-CM

## 2020-03-14 DIAGNOSIS — G720 Drug-induced myopathy: Secondary | ICD-10-CM

## 2020-03-14 DIAGNOSIS — I712 Thoracic aortic aneurysm, without rupture, unspecified: Secondary | ICD-10-CM

## 2020-03-14 DIAGNOSIS — I25119 Atherosclerotic heart disease of native coronary artery with unspecified angina pectoris: Secondary | ICD-10-CM

## 2020-03-14 DIAGNOSIS — I255 Ischemic cardiomyopathy: Secondary | ICD-10-CM

## 2020-03-14 DIAGNOSIS — E785 Hyperlipidemia, unspecified: Secondary | ICD-10-CM | POA: Diagnosis not present

## 2020-03-14 DIAGNOSIS — T466X5A Adverse effect of antihyperlipidemic and antiarteriosclerotic drugs, initial encounter: Secondary | ICD-10-CM

## 2020-03-14 NOTE — Patient Instructions (Addendum)
Medication Instructions:  Continue same medications *If you need a refill on your cardiac medications before your next appointment, please call your pharmacy*   Lab Work: Have PCP send [place] Panel results   Fax # [phone]   Testing/Procedures: None ordered   Follow-Up: At [facility], you and your health needs are our priority.  As part of our continuing mission to provide you with exceptional heart care, we have created designated Provider Care Teams.  These Care Teams include your primary Cardiologist (physician) and Advanced Practice Providers (APPs -  Physician Assistants and Nurse Practitioners) who all work together to provide you with the care you need, when you need it.  We recommend signing up for the patient portal called "MyChart".  Sign up information is provided on this After Visit Summary.  MyChart is used to connect with patients for Virtual Visits (Telemedicine).  Patients are able to view lab/test results, encounter notes, upcoming appointments, etc.  Non-urgent messages can be sent to your provider as well.   To learn more about what you can do with MyChart, go to [email or web address].    Your next appointment:  6 months   The format for your next appointment:  Office     Provider:  Dr.Harding

## 2020-03-14 NOTE — Progress Notes (Signed)
Primary Care Provider: [name], MD Cardiologist: Glenetta Hew, MD Electrophysiologist: None  Clinic Note: Chief Complaint  Patient presents with  . Follow-up    Stable from a cardiac standpoint  . Coronary Artery Disease    No angina  . Cardiomyopathy    Nonischemic-no CHF  . 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 male 40-pack-year smoker (who continues to smoke) with a complicated Cardiovascular History reviewed below who presents today for ~6 month f/u.   Anterior STEMI January 31, 2018: PCI to LAD and staged PCI to RCA   HLD -Statin Myalgia -- >.  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 Sammuel Hines  05/09/19: TEVAR with Darylene Price 34 x 34 x 209 mm due to type 1a endoleak - Dr. Wilhelmenia Blase  05/12/19: TEVAR prox cuff extension with Gore 37 mm x 15 cm due to type III endoleak with Dr. Wilhelmenia Blase.  Postop ileus  02/06/20 [facility]) -R Hypogastric Aneurysm Repair - Stent Graft  Additional PMH:   Hereditary Hemochromatosis  Non-Small cell Lung Cancer - RLL (lung nodule seen on CT scan for aneurysm evaluation)  PET 06/19/2019: RLL lesion  -> Bx + NSCLC yetStg 1 B  -  Lobectomy 08/12/2019 -- negative margins; No further Rx.   Tamario Heal Adams was last seen on July 20, 2019 for preop evaluation for lung biopsy/lobectomy -> he denies any active anginal symptoms.  We will check a 2D echo for  new baseline.  Cleared for surgery  Recent Hospitalizations:   11/8/20211 - [facility] - R Hypogastric (Internal Iliac) aneurysm Repair  Reviewed  CV studies:    The following studies were reviewed today: (if available, images/films reviewed: From Epic Chart or Care Everywhere) . Echo 08/03/19: EF 45-50%.Moderate HK of mid-apical anterior-anteroseptal wall (c/w LAD MI). Mild-Mod AoV sclerosis w/o Stenosis. .   Interval History:   Patrick Adams returns today after procedure since last saw him doing fairly well from cardiac standpoint.  He is not feeling any palpitations.  No exertional chest discomfort.  He does have baseline exertional dyspnea that is stable.  He is not having any claudication.  No CHF symptoms of PND orthopnea or edema..  Walk around some and does things around the house.  CV Review of Symptoms (Summary): positive for - Baseline mild exertional dyspnea with resting dyspnea as well. negative for - chest pain, edema, irregular heartbeat, orthopnea, palpitations, paroxysmal nocturnal dyspnea, rapid heart rate, shortness of breath or Lightheadedness or dizziness, syncope/near syncope or TIA/amaurosis fugax, claudication  The patient does not have symptoms concerning for COVID-19 infection (fever, chills, cough, or new shortness of breath).   REVIEWED OF SYSTEMS   Review of Systems  Constitutional: Negative for malaise/fatigue (Does not have a lot of energy, but not fatigued) and weight loss (He is actually put back on weight  that he lost previously).  HENT: Negative for congestion and nosebleeds.   Respiratory: Positive for cough and shortness of breath (Baseline).        Baseline  Gastrointestinal: Negative for blood in stool and melena.  Genitourinary: Negative for hematuria.  Musculoskeletal: Positive for back pain and joint pain.  Neurological: Positive for dizziness. Negative for weakness and headaches.  Endo/Heme/Allergies: Negative for environmental allergies.  Bruises/bleeds easily.  Psychiatric/Behavioral: Negative for depression and memory loss. The patient is not nervous/anxious and does not have insomnia.    I have reviewed and (if needed) personally updated the patient's problem list, medications, allergies, past medical and surgical history, social and family history.   PAST MEDICAL HISTORY   Past Medical History:  Diagnosis Date  . CAD S/P PCI 01/2018   a) 01/31/2018: p-mLAD 60-100% (DES PCI with 2.75 mm x 38 mm Synergy DES - 4.0-3.6.3.0 mm tapered post-dilation); b) p-mRCA 55%-85%-40% - Synergy DES 3.5 mm x 32 mm -> 4.0 mm; & RPAV 90% --> Synergy DES 2.5 mm x 12 mm -> 3.0 mm;; c) stents patent on re-look Cath 03/19/2018  . Diverticulitis   . Erectile dysfunction   . Hemochromatosis   . Hyperlipidemia   . Hypertension   . Ischemic cardiomyopathy 01/31/2018   Initial post STEMI EF by echo 40 to 45% --> by cath 1 month later LV gram showed EF 55 to 60%. -- most Recent Echo: EF 45-50%. Mid-Apical Anterior/Anterospetal HKc.a LAD infarct.  . MDD (major depressive disorder)   . Nephrolithiasis   . Non-small cell lung cancer, right ([place]) 05/2019   Proven by CT-guided biopsy-stage I  . PAD (peripheral artery disease) ([place])   . Prostate nodule   . SBO (small bowel obstruction) ([place]) 05/2019   Mostly postop ileus  . ST elevation (STEMI) myocardial infarction involving left anterior descending coronary artery ([place]) 01/31/2018   Found to have pLAD 60%-mLAD 100% (DES PCI), distal LAD diffuse 60-70% (med Rx), OM1 65%; also signifiicant p-m RCA & RPAV disease  --> Initial EF ~45% with elevated LVEDP  . 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 Memory Dance, annual scans -> until summer/fall 2021 -> noted progression to 6.2 mm aneurysm --> 11/25/221 [facility]- Dr. Sammuel Hines): 4 V branched FEVAR (CA,SMA, R&L Renal) -> 05/09/19: Type 1a Endoleak  - TEVAR & f/u TEVAR cuff  Extension (2/11)    PAST SURGICAL HISTORY   Past Surgical History:  Procedure Laterality Date  . ABDOMINAL AORTIC ANEURYSM REPAIR  2008   Aortobiiliac repair ->  Dr. Memory Dance, annual scans  . CARDIAC EVENT MONITOR  10/2018   Baseline sinus rhythm with 1 degree AVB.  PVCs noted.  Average heart rate 73 bpm.  3 critical: 1 serious and 8 stable events noted: Critical-4.0-second pauses x3, serious 1 run 6 beat PVCs (NSVT), 3 pauses of 3.2 to 3.5 seconds.  Pauses do have P waves that appear to be blocked on some occasions.  Both junctional escape and normal sinus beats after pauses.  . COLONOSCOPY  2016  . CORONARY STENT INTERVENTION N/A 02/03/2018   Procedure: CORONARY STENT INTERVENTION;  Surgeon: Belva Crome, MD;  Location: [facility] INVASIVE CV LAB;;   staged PCI p-mRCA (Synergy DES 3.5 mm x 32 mm--4.0 mm covering entire diseased segment from  proximal 55 through the 85 and 40% lesions in the mid vessel), RPAV (Synergy DES 2.5 mm x 12 mm--3.0 mm)  . CORONARY/GRAFT  ACUTE MI REVASCULARIZATION N/A 01/31/2018   Procedure: Coronary/Graft Acute MI Revascularization;  Surgeon: Leonie Man, MD;  Location: [place] CV LAB;; ANTERIOR-INFERIOR STEMI: pLAD 60% - mLAD 100% (PCI-Synergy DES 2.75 mm at 38 mm--4.1-3.6-3.0 mm tapered post dilation)  . ENDOVASCULAR RIGHT INTERNAL ILIAC ANEURYSM REPAIR Right 02/06/2020   [facility]; Dr. Sammuel Hines - Stent Graft Placed  . LEFT HEART CATH AND CORONARY ANGIOGRAPHY N/A 01/31/2018   Procedure: LEFT HEART CATH AND CORONARY ANGIOGRAPHY;  Surgeon: Leonie Man, MD;  Location: [facility] INVASIVE CV LAB;;; (anterior inferior STEMI) -> pLAD 60% - mLAD 100% (DES PCI); p-m RCA 55%, mRCA 85%, mRCA 40% (diffuse segmental disease), RPAV 90% (ulcerated), OM1 65%.  EF 35-45%, moderately elevated LVEDP --acute combined systolic and diastolic heart failure, ischemic cardiomyopathy  . LEFT HEART CATH AND CORONARY ANGIOGRAPHY N/A 02/03/2018   Procedure: LEFT HEART CATH AND CORONARY  ANGIOGRAPHY;  Surgeon: Belva Crome, MD;  Location: [place] CV LAB;  Service: Cardiovascular;  Laterality: N/A;  . LEFT HEART CATH AND CORONARY ANGIOGRAPHY N/A 03/19/2018   Procedure: LEFT HEART CATH AND CORONARY ANGIOGRAPHY;  Surgeon: Martinique, Peter M, MD;  Location: [place] CV LAB;  Service: Cardiovascular;  Patent stent in p-mLAD, patent p-m RCA stent and RPAV stent.  Distal LAD 70%, OM1 65%.  EF 55-60%.  . TEVAR  2/8 & 05/12/2019   [facility]-Dr. Sammuel Hines: (for type I a endoleak of FEVAR from 01/2019)  05/02/2019: TEVAR - COOK Alpha 34 x 34 x 209 (RFA); 05/12/19 - prox cuff extension TEVAR (Gore 37 mm x 15 mm, LCFA).  Clydia Llano AORTIC ANEURYSM REPAIR  02/23/2019   [facility]-Dr. Sammuel Hines: admitted for lumbar drain insertion followed by aneurysmal repair 4V FEVAR (CA, SMA, RRA, LRA - Cook Branced Device - Bilateral Femoral Access & L Brachial Cutdown).  . TRANSTHORACIC ECHOCARDIOGRAM  02/01/2018   (Insetting of anterior-inferior STEMI) --> moderate LVH.  EF 40 to 45%.  Anterior, anteroseptal and apical severe hypokinesis to akinesis.  Indeterminate LV filling pressures.  Aortic sclerosis, no stenosis.  . TRANSTHORACIC ECHOCARDIOGRAM  01/17/2019   [facility]: 01/17/2019: EF 55%-normal wall motion..  Mild RV dilation.  Aortic sclerosis but no stenosis  . TRANSTHORACIC ECHOCARDIOGRAM  07/2019   EF 45-50%.Moderate HK of mid-apical anterior-anteroseptal wall (c/w LAD MI). Mild-Mod AoV sclerosis w/o Stenosis. Marland Kitchen    01/31/2018 - LAD, 02/03/2018 mRCA & RPAV    There is no immunization history on file for this patient.  MEDICATIONS/ALLERGIES   Current Meds  Medication Sig  . aspirin EC 81 MG tablet Take 81 mg by mouth at bedtime.  . cholecalciferol (VITAMIN D3) 25 MCG (1000 UT) tablet Take 1,000 Units by mouth daily.  . clopidogrel (PLAVIX) 75 MG tablet TAKE 1 TABLET BY MOUTH EVERY DAY  . losartan (COZAAR) 50 MG tablet TAKE 1 TABLET BY MOUTH EVERY DAY  . nitroGLYCERIN (NITROSTAT) 0.4 MG SL  tablet Place 1 tablet (0.4 mg total) under the tongue every 5 (five) minutes as needed for chest pain.  . polyethylene glycol (MIRALAX / GLYCOLAX) 17 g packet Take 17 g by mouth daily as needed (Constipation).    Allergies  Allergen Reactions  . Brilinta [Ticagrelor] Shortness Of Breath     Patient was taking 60 mg  Twice a day - changed to Clopidogrel 75 mg  03/02/19  . Ciprofloxacin Anaphylaxis  . Statins Other (See Comments)    Joint pain.    SOCIAL HISTORY/FAMILY HISTORY   Reviewed in Epic:  Pertinent findings:  Social  History   Tobacco Use  . Smoking status: Current Every Day Smoker    Packs/day: 0.50    Types: Cigarettes  . Smokeless tobacco: Never Used  Vaping Use  . Vaping Use: Never used  Substance Use Topics  . Alcohol use: No  . Drug use: No   Social History   Social History Narrative   Divorced, retired   Lives alone   1 daughter   Smoker   No drugs/EtOH    OBJCTIVE -PE, EKG, labs   Wt Readings from Last 3 Encounters:  03/14/20 157 lb (71.2 kg)  07/20/19 141 lb 12.8 oz (64.3 kg)  06/16/19 136 lb 3.2 oz (61.8 kg)    Physical Exam: BP 120/70   Pulse 69   Ht 6' (1.829 m)   Wt 157 lb (71.2 kg)   SpO2 98%   BMI 21.29 kg/m  Physical Exam Vitals reviewed.  Constitutional:      General: He is not in acute distress.    Appearance: Normal appearance. He is not ill-appearing (Mild chronically ill-appearing) or toxic-appearing.     Comments: Well-groomed  HENT:     Head: Normocephalic and atraumatic.  Neck:     Vascular: No carotid bruit, hepatojugular reflux or JVD.  Cardiovascular:     Rate and Rhythm: Normal rate and regular rhythm.  No extrasystoles are present.    Chest Wall: PMI is not displaced.     Pulses: Decreased pulses (Diminished pedal pulses).     Heart sounds: Normal heart sounds. No murmur heard. No friction rub. No gallop.   Pulmonary:     Effort: Pulmonary effort is normal. No respiratory distress.     Breath sounds: No  wheezing, rhonchi or rales.     Comments: Diminished breath sounds throughout.   Diffuse interstitial sounds, but No wheezes rales or rhonchi.  Chest:     Chest wall: No tenderness.  Musculoskeletal:        General: No swelling. Normal range of motion.     Cervical back: Normal range of motion and neck supple.  Neurological:     General: No focal deficit present.     Mental Status: He is alert and oriented to person, place, and time.  Psychiatric:        Mood and Affect: Mood normal.        Behavior: Behavior normal.        Thought Content: Thought content normal.     Comments: Lower poor insight.  Not really interested in smoking cessation counseling nor is a very interested in reating cholesterol.      Adult ECG Report N/a   Recent Labs: No lipid panel since September 4818  Basic Metabolic Panel Component 56/31/49 02/02/20 05/16/19 05/16/19  Sodium 136 137 133Low 129Low  Potassium 4.1 4.7High 4.0 3.8  Chloride 103 102 102 99  CO2 25.0 29.0 27.0 31.0High  Anion Gap 8 6 4Low <1Low  BUN _0 Creatinine 1.39 1.48High 0.90 0.90  BUN/Creatinine Ratio _1 EGFR CKD-EPI Non-African American, Male 50 47Low 86 30  EGFR CKD-EPI African American, Male 59 54Low >90 >90  Glucose 80 83 104 104  Calcium 9.4 9.7 8.1Low 7.6Low   CBC Component 02/07/20 02/02/20 05/16/19 05/15/19 05/15/19  WBC 7.7 6.0 4.1Low 4.6 4.4Low  HGB 13.3 14.6 9.2Low 9.5Low 8.5Low  HCT 41.4 43.6 27.3Low 28.0Low 27.1Low  Platelet 279 281 177 166 176    Lab Results  Component Value Date  CHOL 224 (H) 12/22/2018   HDL 46 12/22/2018   LDLCALC 146 (H) 12/22/2018   TRIG 175 (H) 12/22/2018   CHOLHDL 4.9 12/22/2018    No results found for: TSH  ASSESSMENT/PLAN    Problem List Items Addressed This Visit    Coronary artery disease involving native coronary artery of native heart with angina pectoris ([place]) - Primary (Chronic)    No further anginal  symptoms.  He is on maintenance dose Plavix (with recommendations of treatment per vascular surgery).  Not on beta-blocker because of fatigue and bradycardia.  On ARB.  Not on statin because of statin myalgias.      Ischemic cardiomyopathy (Chronic)    EF 45 to 50% noted on recent echocardiogram is probably accurate given the fact that there was evidence of anterior MI with wall motion abnormality in the LAD distribution. He is not having any active anginal symptoms.  He is not on beta-blocker because of issues with fatigue in the past along with bradycardia.  Currently on losartan (although needs to be refilled.)  NYHA class I-II symptoms.  No diuretic requirement.      Essential hypertension (Chronic)    Blood pressure stable.  We previously increased his losartan to 50 mg.  Continue current dose.      Hyperlipidemia with target LDL less than 70 (Chronic)    Is against taking medications in general.  Refused to take statins or Zetia.  I will try to get his lipid panel from his PCP that was done a few weeks ago.  Based on those results would like to refer to our [facility] CVRR (Cardiovascular Risk Reduction) Lipid Clinic Run by ER Clinical Pharmacist.  Need to discuss the possibility of PCSK9 inhibitor.      Tobacco abuse (Chronic)    Drug cessation counseling provided.  He now has coronary disease, AAA/TIA/iliac artery aneurysm and now lung cancer.  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  He has now had stents from the right internal iliac up through the thoracic aorta.  Decisions about timing and duration of Plavix treatment per vascular surgery.  He has been using Advil for pain.  My  recommendation will be if he uses Advil, he should not take aspirin in addition to Plavix.      Statin myopathy (Chronic)    Has been intolerant of just on any Statin.  Mostly related to his tendency to be intolerant of medications in general.  We will refer to CVRR Lipid Clinic-consider PCSK9 inhibitor or newer options.        COVID-19 Education: The signs and symptoms of COVID-19 were discussed with the patient and how to seek care for testing (follow up with PCP or arrange E-visit).   The importance of social distancing and COVID-19 vaccination was discussed today. 1 min The patient is practicing social distancing & Masking.   I spent a total of 316inutes with the patient spent in direct patient consultation.  Additional time spent with chart review  / charting (studies, outside notes, etc): 25 min  Total Time: 31mn   Current medicines are reviewed at length with the patient today.  (+/- concerns) n/a  This visit occurred during the SARS-CoV-2 public health emergency.  Safety protocols  were in place, including screening questions prior to the visit, additional usage of staff PPE, and extensive cleaning of exam room while observing appropriate contact time as indicated for disinfecting solutions.  Notice: This dictation was prepared with Dragon dictation along with smaller phrase technology. Any transcriptional errors that result from this process are unintentional and may not be corrected upon review.  Patient Instructions / Medication Changes & Studies & Tests Ordered   Patient Instructions  Medication Instructions:  Continue same medications *If you need a refill on your cardiac medications before your next appointment, please call your pharmacy*   Lab Work: Have PCP send Dr.Berkeley Vanaken Lipid Panel results   Fax # [phone]   Testing/Procedures: None ordered   Follow-Up: At [facility], you and your health needs are our priority.  As part of our continuing mission  to provide you with exceptional heart care, we have created designated Provider Care Teams.  These Care Teams include your primary Cardiologist (physician) and Advanced Practice Providers (APPs -  Physician Assistants and Nurse Practitioners) who all work together to provide you with the care you need, when you need it.  We recommend signing up for the patient portal called "MyChart".  Sign up information is provided on this After Visit Summary.  MyChart is used to connect with patients for Virtual Visits (Telemedicine).  Patients are able to view lab/test results, encounter notes, upcoming appointments, etc.  Non-urgent messages can be sent to your provider as well.   To learn more about what you can do with MyChart, go to [email or web address].    Your next appointment:  6 months   The format for your next appointment:  Office     Provider:  Dr.Kaylana Fenstermacher    Studies Ordered:   No orders of the defined types were placed in this encounter.    Glenetta Hew, M.D., M.S. Interventional Cardiologist   Pager # [phone] Phone # [phone] [address] [place], [place] [address]   Thank you for choosing Heartcare at [facility]!!

## 2020-03-30 ENCOUNTER — Encounter: Payer: Self-pay | Admitting: Cardiology

## 2020-03-30 DIAGNOSIS — M791 Myalgia, unspecified site: Secondary | ICD-10-CM | POA: Insufficient documentation

## 2020-03-30 DIAGNOSIS — G720 Drug-induced myopathy: Secondary | ICD-10-CM | POA: Insufficient documentation

## 2020-03-30 NOTE — Assessment & Plan Note (Signed)
Has been intolerant of just on any Statin.  Mostly related to his tendency to be intolerant of medications in general.  We will refer to CVRR Lipid Clinic-consider PCSK9 inhibitor or newer options.

## 2020-03-30 NOTE — Assessment & Plan Note (Signed)
No further anginal symptoms.  He is on maintenance dose Plavix (with recommendations of treatment per vascular surgery).  Not on beta-blocker because of fatigue and bradycardia.  On ARB.  Not on statin because of statin myalgias.

## 2020-03-30 NOTE — Assessment & Plan Note (Signed)
Is against taking medications in general.  Refused to take statins or Zetia.  I will try to get his lipid panel from his PCP that was done a few weeks ago.  Based on those results would like to refer to our [facility] CVRR (Cardiovascular Risk Reduction) Lipid Clinic Run by ER Clinical Pharmacist.  Need to discuss the possibility of PCSK9 inhibitor.

## 2020-03-30 NOTE — Assessment & Plan Note (Signed)
Blood pressure stable.  We previously increased his losartan to 50 mg.  Continue current dose.

## 2020-03-30 NOTE — Assessment & Plan Note (Signed)
Being followed by vascular surgery at [facility].  He has now had stents from the right internal iliac up through the thoracic aorta.  Decisions about timing and duration of Plavix treatment per vascular surgery.  He has been using Advil for pain.  My recommendation will be if he uses Advil, he should not take aspirin in addition to Plavix.

## 2020-03-30 NOTE — Assessment & Plan Note (Addendum)
Drug cessation counseling provided.  He now has coronary disease, AAA/TIA/iliac artery aneurysm and now lung cancer.  Smoking cessation instruction/counseling given (4 min):  counseled patient on the dangers of tobacco use, advised patient to stop smoking, and reviewed strategies to maximize success

## 2020-03-30 NOTE — Assessment & Plan Note (Signed)
EF 45 to 50% noted on recent echocardiogram is probably accurate given the fact that there was evidence of anterior MI with wall motion abnormality in the LAD distribution. He is not having any active anginal symptoms.  He is not on beta-blocker because of issues with fatigue in the past along with bradycardia.  Currently on losartan (although needs to be refilled.)  NYHA class I-II symptoms.  No diuretic requirement.

## 2020-09-10 ENCOUNTER — Other Ambulatory Visit: Payer: Self-pay

## 2020-09-10 ENCOUNTER — Ambulatory Visit (INDEPENDENT_AMBULATORY_CARE_PROVIDER_SITE_OTHER): Payer: Medicare HMO | Admitting: Cardiology

## 2020-09-10 VITALS — BP 128/62 | HR 73 | Ht 72.0 in | Wt 161.4 lb

## 2020-09-10 DIAGNOSIS — G720 Drug-induced myopathy: Secondary | ICD-10-CM | POA: Diagnosis not present

## 2020-09-10 DIAGNOSIS — I25119 Atherosclerotic heart disease of native coronary artery with unspecified angina pectoris: Secondary | ICD-10-CM

## 2020-09-10 DIAGNOSIS — T466X5A Adverse effect of antihyperlipidemic and antiarteriosclerotic drugs, initial encounter: Secondary | ICD-10-CM

## 2020-09-10 DIAGNOSIS — Z8679 Personal history of other diseases of the circulatory system: Secondary | ICD-10-CM

## 2020-09-10 DIAGNOSIS — T466X5D Adverse effect of antihyperlipidemic and antiarteriosclerotic drugs, subsequent encounter: Secondary | ICD-10-CM

## 2020-09-10 DIAGNOSIS — I10 Essential (primary) hypertension: Secondary | ICD-10-CM | POA: Diagnosis not present

## 2020-09-10 DIAGNOSIS — Z720 Tobacco use: Secondary | ICD-10-CM | POA: Diagnosis not present

## 2020-09-10 DIAGNOSIS — I2109 ST elevation (STEMI) myocardial infarction involving other coronary artery of anterior wall: Secondary | ICD-10-CM

## 2020-09-10 DIAGNOSIS — E785 Hyperlipidemia, unspecified: Secondary | ICD-10-CM | POA: Diagnosis not present

## 2020-09-10 DIAGNOSIS — N529 Male erectile dysfunction, unspecified: Secondary | ICD-10-CM | POA: Insufficient documentation

## 2020-09-10 DIAGNOSIS — I712 Thoracic aortic aneurysm, without rupture, unspecified: Secondary | ICD-10-CM

## 2020-09-10 DIAGNOSIS — N5201 Erectile dysfunction due to arterial insufficiency: Secondary | ICD-10-CM

## 2020-09-10 DIAGNOSIS — I255 Ischemic cardiomyopathy: Secondary | ICD-10-CM

## 2020-09-10 DIAGNOSIS — F17210 Nicotine dependence, cigarettes, uncomplicated: Secondary | ICD-10-CM | POA: Diagnosis not present

## 2020-09-10 MED ORDER — SILDENAFIL CITRATE 100 MG PO TABS: 100.0000 mg | ORAL_TABLET | Freq: Every day | ORAL | 6 refills | Status: AC | PRN

## 2020-09-10 NOTE — Patient Instructions (Addendum)
Medication Instructions:    VIAGRA OR GENERIC AS NEEDED 100 MG -  *If you need a refill on your cardiac medications before your next appointment, please call your pharmacy*   Lab Work: DO LABS BEFORE  APPOINTMENT   CBC LIPID- fasting  HEPATIC  If you have labs (blood work) drawn today and your tests are completely normal, you will receive your results only by: [place] (if you have MyChart) OR A paper copy in the mail If you have any lab test that is abnormal or we need to change your treatment, we will call you to review the results.   Testing/Procedures: Not needed   Follow-Up: At [facility], you and your health needs are our priority.  As part of our continuing mission to provide you with exceptional heart care, we have created designated Provider Care Teams.  These Care Teams include your primary Cardiologist (physician) and Advanced Practice Providers (APPs -  Physician Assistants and Nurse Practitioners) who all work together to provide you with the care you need, when you need it.  We recommend signing up for the patient portal called "MyChart".  Sign up information is provided on this After Visit Summary.  MyChart is used to connect with patients for Virtual Visits (Telemedicine).  Patients are able to view lab/test results, encounter notes, upcoming appointments, etc.  Non-urgent messages can be sent to your provider as well.   To learn more about what you can do with MyChart, go to [email or web address].    Your next appointment:   6 month(s)  The format for your next appointment:   In Person  Provider:   Glenetta Hew, MD   Other Instructions  Your physician discussed the hazards of tobacco use. Tobacco use cessation is recommended and techniques and options to help you quit were discussed. RETRY NICOTINE PATCH OR TABLETS

## 2020-09-10 NOTE — Progress Notes (Signed)
Primary Care Provider: [name], MD Cardiologist: Glenetta Hew, MD Electrophysiologist: None Vascular Surgery: Dr. Sammuel Hines  Clinic Note: Chief Complaint  Patient presents with   Follow-up    6 months.  Doing well.  Has put weight back on.  Back to exercising.  Limited by MSK pain.   Coronary Artery Disease    No angina or significantly worsening exertional dyspnea.    ===================================  ASSESSMENT/PLAN   Problem List Items Addressed This Visit     Coronary artery disease involving native coronary artery of native heart with angina pectoris ([place]) - Primary (Chronic)    Extensive PCI to the RCA and LAD following inferior STEMI.  EF improved to normal.   Plan: Continue ARB.  No beta-blocker because of fatigue issues and COPD. [facility] Lipid Clinic referral for lipid management For now continue DAPT  per Vascular Surgery.  Would preferably keep him on Plavix over aspirin if the decision is made to cut to monotherapy.       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  Follow-up In December 2020 showed widely patent stents.  No further episodes of chest tightness or pressure.    Resolved cardiomyopathy with EF back to baseline normal.  No beta-blocker because of bradycardia and fatigue issues.  Is on moderate dose ARB.  On DAPT partially because of significant central vascular disease.  Needs better lipid control.       Relevant Medications   sildenafil (VIAGRA) 100 MG tablet   Ischemic cardiomyopathy (Chronic)    Notable improvement in EF up to baseline 55-60% following PCI. NYHA class I CHF symptoms probably more related to his baseline exertional dyspnea from COPD.       Relevant  Medications   sildenafil (VIAGRA) 100 MG tablet   Essential hypertension (Chronic)    Stable blood pressure on 50 mg losartan.  No change.       Relevant Medications   sildenafil (VIAGRA) 100 MG tablet   Other Relevant Orders   EKG 12-Lead (Completed)   CBC   AMB [facility] to [facility]   Hyperlipidemia with target LDL less than 70 (Chronic)    He tried Livalo for a while, but had financial issues.  Has tried a total of 4 statins including atorvastatin, rosuvastatin, simvastatin and Livalo.  Labs are clearly not well controlled.  LDL is 141, and he has extensive LEVELS.  Target LDL should be less than 50 for him.  Plan: Recheck lipids and refer to [facility] for PCSK9 inhibitors versus inclisiran.       Relevant Medications   sildenafil (VIAGRA) 100 MG tablet   Other Relevant Orders   Hepatic function panel   Lipid panel   AMB Referral to [facility]   Tobacco abuse (Chronic)    Smoking cessation instruction/counseling given:  counseled patient on the dangers of tobacco use, advised patient to stop smoking, and reviewed strategies to maximize success   He says he does have patches, just has not yet used them.  I recommend that he actually pick a date and start using patches to stop smoking.  He has extensive vascular disease both cardiac and central.  Lipids not  well controlled and smoking is a huge risk factor.  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 Sammuel Hines at [facility] from vascular surgery.  Follow-up CT scan seems to be stable.  At this point I would defer timing and duration of Plavix treatment to vascular surgery.  He is on aspirin and Plavix.  Was told that he should never stop aspirin, but I wonder if he really needs to be on both aspirin and Plavix.  Would preferably stay on one of the other, and probably Plavix would  be better coverage.       Relevant Medications   sildenafil (VIAGRA) 100 MG tablet   Other Relevant Orders   EKG 12-Lead (Completed)   AMB Referral to [facility]   Statin myopathy (Chronic)    He has tried 3 of the standard statins including simvastatin, atorvastatin and rosuvastatin.  Was briefly on Livalo, but stopped taking because of financial issues.  Plan: Recheck lipids, and refer to CVRR lipid clinic to discuss PCSK9 inhibitors.  Perhaps he could be a candidate for inclisiran.       Relevant Orders   Hepatic function panel   Lipid panel   CBC   AMB Referral to [facility] Pharm-D   Erectile dysfunction (Chronic)    Clearly vascular etiology in nature.  Did well with Viagra in the past.  Will refill prescription for Viagra 100 mg PRN.        ===================================  HPI:    Patrick Adams is a 75 y.o. male with a 40-pack-year smoking history (continues to smoke) complicated cARDIOVASCULAR HISTORY noted below, who presents today for 6 MONTH F/U.  CAD: Anterior STEMI January 31, 2018: PCI to LAD and staged PCI to RCA (see below)  HLD -Statin Myalgia -- >.  Had not been taking Livalo or Zetia AAA-open aortobiiliac repair in 2008 -> extended to TAAA Redo FEVAR followed by TEVAR @ [facility] Sept 2020 & Feb 2021) 02/23/19: TAAA repair with 4 vessel CMD FEVAR (CA, SMA, RRA, LRA) - Dr. Sammuel Hines 05/09/19: TEVAR with Darylene Price 34 x 34 x 209 mm due to type 1a endoleak - Dr. Wilhelmenia Blase 05/12/19: TEVAR prox cuff extension with Gore 37 mm x 15 cm due to type III endoleak with Dr. Wilhelmenia Blase. 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 --> 6 month f/u - stale    Patrick Adams was last seen on 03/14/2020:  He was doing fairly well from a cardiac standpoint.  He had just had his hypogastric aneurysm repair the previous month.  Was doing well.  No further claudication.  No CHF or angina symptoms.  Gradually increasing his level of activity.  Baseline exertional dyspnea with cough but no progression of symptoms.  Mild dizziness, back pain.Marland Kitchen  Recent Hospitalizations: None  Reviewed  CV studies:    The following studies were reviewed today: (if available, images/films reviewed: From Epic Chart or Care Everywhere) CTA chest abdomen pelvis 08/23/2020 [facility]): Stable sequelae of thoracic or lumbar abdominal aortic repair with fenestrated endograft.  Unchanged bilobed excluded aneurysmal sac  measuring up to 6.5 cm in suprarenal aorta.  Stented right internal iliac artery with excluded aneurysmal sac measuring 3.6 cm-unchanged.  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   Interval History:   Patrick Adams returns here today for follow-up doing remarkably well.  He says he is gradually getting back to how he felt prior to his MI.  He is put back on weight 261 pounds and is happy with where he is now.  He has been all the way down to 95 pounds.  He has not had any resting or exertional chest tightness or pressure.  His baseline dyspnea is there but no worsening exertional dyspnea.  May be some trivial end of day edema, but claudication is pretty much resolved.  He is gradually started to walk, but is very careful to not overdo it.  He still has musculoskeletal pains on the right back-hip and knee.  It usually, limits his walking.  No CHF symptoms of PND, orthopnea or significant edema.  No arrhythmia symptoms of rapid irregular heartbeats or palpitations.  No syncope or near syncope.  He does notice that in general he does not have as much strength as he used to have.  He does note having issues with erectile dysfunction.  CV Review of Symptoms (Summary): positive for - dyspnea on exertion,  shortness of breath, and overall reduced energy, but notably improved.  Exercise intolerance and stable exertional dyspnea. negative for - chest pain, edema, irregular heartbeat, orthopnea, palpitations, paroxysmal nocturnal dyspnea, rapid heart rate, or lightheadedness, dizziness or syncope/near syncope, TIA/amaurosis fugax, claudication  REVIEWED OF SYSTEMS   Review of Systems  Constitutional:  Negative for malaise/fatigue (With exercise intolerance, but nothing significant.) and weight loss (Steadily gaining back weight.  He is happy with where he currently is.  Preop from all of his issues was 185.  Was elevated at 95 pounds.  Now at 161.).  HENT:  Negative for congestion and nosebleeds.   Respiratory:  Positive for cough and shortness of breath.        Baseline from COPD.  Cardiovascular:  Negative for claudication and leg swelling.  Gastrointestinal:  Negative for blood in stool and melena.  Genitourinary:  Negative for hematuria.  Musculoskeletal:  Positive for back pain and joint pain (Hips and knees).  Neurological:  Positive for tingling (Seems like he has mild neuropathy). Negative for dizziness.  Psychiatric/Behavioral:  Negative for depression and memory loss. The patient is not nervous/anxious and does not have insomnia.     I have reviewed and (if needed) personally updated the patient's problem list, medications, allergies, past medical and surgical history, social and family history.   PAST MEDICAL HISTORY   Past Medical History:  Diagnosis Date   CAD S/P PCI 01/2018   a) 01/31/2018: p-mLAD 60-100% (DES PCI with 2.75 mm x 38 mm Synergy DES - 4.0-3.6.3.0 mm tapered post-dilation); b) p-mRCA 55%-85%-40% - Synergy DES 3.5 mm x 32 mm -> 4.0 mm; & RPAV 90% --> Synergy DES 2.5 mm x 12 mm -> 3.0 mm;; c) stents patent on re-look Cath 03/19/2018   Diverticulitis    Erectile dysfunction    Hemochromatosis    Hyperlipidemia    Hypertension    Ischemic cardiomyopathy 01/31/2018    Initial post STEMI EF by echo 40 to 45% --> by cath 1 month later LV gram showed EF 55 to 60%. -- most Recent Echo: EF 45-50%. Mid-Apical Anterior/Anterospetal HKc.a LAD infarct.   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 Memory Dance, annual scans -> until summer/fall 2021 -> noted progression to 6.2 mm aneurysm --> 11/25/221 [facility]- Dr. Sammuel Hines): 4 V branched FEVAR (CA,SMA, R&L Renal) -> 05/09/19: Type 1a Endoleak  - TEVAR & f/u TEVAR cuff Extension (2/11)    PAST SURGICAL HISTORY   Past Surgical History:  Procedure Laterality Date   ABDOMINAL AORTIC ANEURYSM REPAIR  2008   Aortobiiliac repair ->  Dr. Memory Dance, annual scans   CARDIAC EVENT MONITOR  10/2018   Baseline sinus rhythm with 1 degree AVB.  PVCs noted.  Average heart rate 73 bpm.  3 critical: 1 serious and 8 stable events noted: Critical-4.0-second pauses x3, serious 1 run 6 beat PVCs (NSVT), 3 pauses of 3.2 to 3.5 seconds.  Pauses do have P waves that appear to be blocked on some occasions.  Both junctional escape and normal sinus beats after pauses.   COLONOSCOPY  2016   CORONARY STENT INTERVENTION N/A 02/03/2018   Procedure: CORONARY STENT INTERVENTION;  Surgeon: Belva Crome, MD;  Location: [facility] INV[facility] LAB;;   staged PCI p-mRCA (Synergy DES 3.5 mm x 32 mm--4.0 mm covering entire  diseased segment from  proximal 55 through the 85 and 40% lesions in the mid vessel), RPAV (Synergy DES 2.5 mm x 12 mm--3.0 mm)   CORONARY/GRAFT ACUTE MI REVASCULARIZATION N/A 01/31/2018   Procedure: Coronary/Graft Acute MI Revascularization;  Surgeon: Leonie Man, MD;  Location: [place] CV LAB;; ANTERIOR-INFERIOR STEMI: pLAD 60% - mLAD 100% (PCI-Synergy DES 2.75 mm at 38 mm--4.1-3.6-3.0 mm tapered post dilation)   ENDOVASCULAR RIGHT INTERNAL ILIAC ANEURYSM REPAIR Right 02/06/2020   [facility]; Dr. Sammuel Hines - Stent Graft Placed   LEFT HEART CATH AND CORONARY ANGIOGRAPHY N/A 01/31/2018   Procedure: LEFT HEART CATH AND CORONARY ANGIOGRAPHY;  Surgeon: Leonie Man, MD;  Location: [facility] INVAS[facility] CV LAB;;; (anterior inferior STEMI) -> pLAD 60% - mLAD 100% (DES PCI); p-m RCA 55%, mRCA 85%, mRCA 40% (diffuse segmental disease), RPAV 90% (ulcerated), OM1 65%.  EF 35-45%, moderately elevated LVEDP --acute combined systolic and diastolic heart failure, ischemic cardiomyopathy   LEFT HEART CATH AND CORONARY ANGIOGRAPHY N/A 02/03/2018   Procedure: LEFT HEART CATH AND CORONARY ANGIOGRAPHY;  Surgeon: Belva Crome, MD;  Location: [place] [facility];  Service: Cardiovascular;  Laterality: N/A;   LEFT HEART CATH AND CORONARY ANGIOGRAPHY N/A 03/19/2018   Procedure: LEFT HEART CATH AND CORONARY ANGIOGRAPHY;  Surgeon: Martinique, Peter M, MD;  Location: [place] CV LAB;  Service: Cardiovascular;  Patent stent in p-mLAD, patent p-m RCA stent and RPAV stent.  Distal LAD 70%, OM1 65%.  EF 55-60%.   TEVAR  2/8 & 05/12/2019   [facility]-Dr. Sammuel Hines: (for type I a endoleak of FEVAR from 01/2019)  05/02/2019: TEVAR - COOK Alpha 34 x 34 x 209 (RFA); 05/12/19 - prox cuff  extension TEVAR (Gore 37 mm x 15 mm, LCFA).   THORACOABDOMINAL AORTIC ANEURYSM REPAIR  02/23/2019   [facility]-Dr. Sammuel Hines: admitted for lumbar drain insertion followed by aneurysmal repair 4V FEVAR (CA, SMA, RRA, LRA - Cook Branced Device - Bilateral Femoral Access & L Brachial  Cutdown).   TRANSTHORACIC ECHOCARDIOGRAM  02/01/2018   (Insetting of anterior-inferior STEMI) --> moderate LVH.  EF 40 to 45%.  Anterior, anteroseptal and apical severe hypokinesis to akinesis.  Indeterminate LV filling pressures.  Aortic sclerosis, no stenosis.   TRANSTHORACIC ECHOCARDIOGRAM  01/17/2019   [facility]: 01/17/2019: EF 55%-normal wall motion..  Mild RV dilation.  Aortic sclerosis but no stenosis   TRANSTHORACIC ECHOCARDIOGRAM  07/2019   EF 45-50%.Moderate HK of mid-apical anterior-anteroseptal wall (c/w LAD MI). Mild-Mod AoV sclerosis w/o Stenosis. Marland Kitchen      01/31/2018 - LAD, 02/03/2018 mRCA & RPAV         There is no immunization history on file for this patient.  MEDICATIONS/ALLERGIES   Current Meds  Medication Sig   aspirin EC 81 MG tablet Take 81 mg by mouth at bedtime.   cholecalciferol (VITAMIN D3) 25 MCG (1000 UT) tablet Take 1,000 Units by mouth daily.   clopidogrel (PLAVIX) 75 MG tablet TAKE 1 TABLET BY MOUTH EVERY DAY   losartan (COZAAR) 50 MG tablet TAKE 1 TABLET BY MOUTH EVERY DAY   polyethylene glycol (MIRALAX / GLYCOLAX) 17 g packet Take 17 g by mouth daily as needed (Constipation).   sildenafil (VIAGRA) 100 MG tablet Take 1 tablet (100 mg total) by mouth daily as needed for erectile dysfunction.    Allergies  Allergen Reactions   Brilinta [Ticagrelor] Shortness Of Breath     Patient was taking 60 mg  Twice a day - changed to Clopidogrel 75 mg  03/02/19   Ciprofloxacin Anaphylaxis   Statins Other (See Comments)    Joint pain.Myalgias -- HAS TRIED LIPITOR, ZOCOR & CRESTOR    SOCIAL HISTORY/FAMILY HISTORY   Reviewed in Epic:  Pertinent findings:  Social History   Tobacco Use   Smoking status: Every Day    Packs/day: 0.50    Pack years: 0.00    Types: Cigarettes   Smokeless tobacco: Never  Vaping Use   Vaping Use: Never used  Substance Use Topics   Alcohol use: No   Drug use: No   Social History   Social History Narrative   Divorced,  retired   Lives alone   1 daughter   Smoker   No drugs/EtOH    OBJCTIVE -PE, EKG, labs   Wt Readings from Last 3 Encounters:  09/10/20 161 lb 6.4 oz (73.2 kg)  03/14/20 157 lb (71.2 kg)  07/20/19 141 lb 12.8 oz (64.3 kg)    Physical Exam: BP 128/62   Pulse 73   Ht 6' (1.829 m)   Wt 161 lb 6.4 oz (73.2 kg)   SpO2 96%   BMI 21.89 kg/m  Physical Exam Vitals reviewed.  Constitutional:      General: He is not in acute distress.    Appearance: Normal appearance. He is normal weight. He is not ill-appearing or toxic-appearing.     Comments: Well-nourished, well-groomed.  Healthy-appearing.  HENT:     Head: Normocephalic and atraumatic.  Neck:     Vascular: Carotid bruit (Cannot exclude soft carotid bruit, but not significant.) present. No hepatojugular reflux or JVD.  Cardiovascular:     Rate and Rhythm: Normal rate and regular rhythm. No extrasystoles are present.  Chest Wall: PMI is not displaced.     Pulses: Decreased pulses (Diminished but palpable pedal pulses.).     Heart sounds: S1 normal and S2 normal. Heart sounds are distant. No murmur heard.   No friction rub. No gallop.  Pulmonary:     Effort: Pulmonary effort is normal. No respiratory distress.     Comments: Mild diffuse interstitial sounds, but no wheezes rales or rhonchi. Chest:     Chest wall: No tenderness.  Musculoskeletal:        General: No swelling. Normal range of motion.     Cervical back: Normal range of motion and neck supple.  Skin:    General: Skin is warm and dry.  Neurological:     General: No focal deficit present.     Mental Status: He is alert and oriented to person, place, and time.  Psychiatric:        Mood and Affect: Mood normal.        Behavior: Behavior normal.        Thought Content: Thought content normal.        Judgment: Judgment normal.     Comments: In a very good spirits.     Adult ECG Report  Rate: 73 ;  Rhythm: normal sinus rhythm and nonspecific ST and T wave  changes.  Septal leads are not placed correctly.  There is very early transition of R wave.  Artifact noted in V3 and V4.  1 PVC noted. ;   Narrative Interpretation: Likely incorrect lead placement.  Recent Labs:   [facility] Related to Lipid Panel w/Chol/HDL Ratio Component 05/29/20 01/13/18 06/22/17  Cholesterol, Total 214 High  212 High  199  Triglycerides 134 109 141  HDL 49 45 39 Low   VLDL Cholesterol Cal '24 22 28  ' LDL 141 High  145 High  132 High     Comprehensive Metabolic Panel Component 76/80/88  11/07/19  08/11/19   Na 135 136 135 Low   Potassium 4.3 3.8 4.5  Cl 101 102 103  CO2 '29 29 23   ' AGAP '5 5 9   ' Glucose 79 88 133 High    BUN 26 High  26 High  22   Creatinine 1.43 High  1.33 High  1.00   Ca 9.8 9.9 8.6   ALK PHOS 127 High  119 High  --  T Bili 0.54 0.54 --  Total Protein 6.5 6.6 --  Alb 3.9 3.8 --  GLOBULIN 2.6 2.8 --  ALBUMIN/GLOBULIN RATIO 1.5 1.4 --  BUN/CREAT RATIO 18.2 19.5 22.0   ALT 7 6 --  AST 10 9    [facility] Related to Creatinine Component 08/23/20  03/22/20  02/07/20  02/02/20   Creatinine 1.37 High  1.37 High  1.39 High  1.48 High   eGFR CKD-EPI (2021) Male 54 Low         No results found for: TSH  ==================================================  COVID-19 Education: The signs and symptoms of COVID-19 were discussed with the patient and how to seek care for testing (follow up with PCP or arrange E-visit).    I spent a total of  32 minutes with the patient spent in direct patient consultation.  Additional time spent with chart review  / charting (studies, outside notes, etc): 16 min Total Time: 48 min  Current medicines are reviewed at length with the patient today.  (+/- concerns) n/a -> we talked about his lipids.  He says that he has in  the past had significant myopathy symptoms with first simvastatin followed by atorvastatin.  He then switched to rosuvastatin and did okay for a little while, but then had similar symptoms  of myalgias and memory issues.  He had been on Livalo, but stopped taking it because of financial issues.  This visit occurred during the SARS-CoV-2 public health emergency.  Safety protocols were in place, including screening questions prior to the visit, additional usage of staff PPE, and extensive cleaning of exam room while observing appropriate contact time as indicated for disinfecting solutions.  Notice: This dictation was prepared with Dragon dictation along with smaller phrase technology. Any transcriptional errors that result from this process are unintentional and may not be corrected upon review.  Patient Instructions / Medication Changes & Studies & Tests Ordered   Patient Instructions  Medication Instructions:    VIAGRA OR GENERIC AS NEEDED 100 MG -  *If you need a refill on your cardiac medications before your next appointment, please call your pharmacy*   Lab Work: DO LABS BEFORE  APPOINTMENT   CBC LIPID- fasting  HEPATIC  If you have labs (blood work) drawn today and your tests are completely normal, you will receive your results only by: [place] (if you have MyChart) OR A paper copy in the mail If you have any lab test that is abnormal or we need to change your treatment, we will call you to review the results.   Testing/Procedures: Not needed   Follow-Up: At [facility], you and your health needs are our priority.  As part of our continuing mission to provide you with exceptional heart care, we have created designated Provider Care Teams.  These Care Teams include your primary Cardiologist (physician) and Advanced Practice Providers (APPs -  Physician Assistants and Nurse Practitioners) who all work together to provide you with the care you need, when you need it.  We recommend signing up for the patient portal called "MyChart".  Sign up information is provided on this After Visit Summary.  MyChart is used to connect with patients for Virtual Visits  (Telemedicine).  Patients are able to view lab/test results, encounter notes, upcoming appointments, etc.  Non-urgent messages can be sent to your provider as well.   To learn more about what you can do with MyChart, go to [email or web address].    Your next appointment:   6 month(s)  The format for your next appointment:   In Person  Provider:   Glenetta Hew, MD   Other Instructions  Your physician discussed the hazards of tobacco use. Tobacco use cessation is recommended and techniques and options to help you quit were discussed. RETRY NICOTINE PATCH OR TABLETS      Studies Ordered:   Orders Placed This Encounter  Procedures   Hepatic function panel   Lipid panel   CBC   AMB Referral to [facility] Pharm-D   EKG 12-Lead      Glenetta Hew, M.D., M.S. Interventional Cardiologist   Pager # [phone] Phone # [phone] [address] [place], [place] [address]   Thank you for choosing [facility] at [facility]!!

## 2020-09-16 ENCOUNTER — Encounter: Payer: Self-pay | Admitting: Cardiology

## 2020-09-16 NOTE — Assessment & Plan Note (Signed)
Followed by Dr. Sammuel Hines at [facility] from vascular surgery.  Follow-up CT scan seems to be stable.  At this point I would defer timing and duration of Plavix treatment to vascular surgery.  He is on aspirin and Plavix.  Was told that he should never stop aspirin, but I wonder if he really needs to be on both aspirin and Plavix.  Would preferably stay on one of the other, and probably Plavix would be better coverage.

## 2020-09-16 NOTE — Assessment & Plan Note (Signed)
Notable improvement in EF up to baseline 55-60% following PCI. NYHA class I CHF symptoms probably more related to his baseline exertional dyspnea from COPD.

## 2020-09-16 NOTE — Assessment & Plan Note (Signed)
Extensive PCI to the RCA and LAD following inferior STEMI.  EF improved to normal.   Plan:  Continue ARB.  No beta-blocker because of fatigue issues and COPD.  [facility] Lipid Clinic referral for lipid management  For now continue DAPT  per Vascular Surgery.  Would preferably keep him on Plavix over aspirin if the decision is made to cut to monotherapy.

## 2020-09-16 NOTE — Assessment & Plan Note (Signed)
Stable blood pressure on 50 mg losartan.  No change.

## 2020-09-16 NOTE — Assessment & Plan Note (Signed)
We will check CBC along with routine labs.

## 2020-09-16 NOTE — Assessment & Plan Note (Addendum)
He tried Livalo for a while, but had financial issues.  Has tried a total of 4 statins including atorvastatin, rosuvastatin, simvastatin and Livalo.  Labs are clearly not well controlled.  LDL is 141, and he has extensive LEVELS.  Target LDL should be less than 50 for him.  Plan: Recheck lipids and refer to [facility] for PCSK9 inhibitors versus inclisiran.

## 2020-09-16 NOTE — Assessment & Plan Note (Signed)
2 and half years out from STEMI.  Follow-up In December 2020 showed widely patent stents.  No further episodes of chest tightness or pressure.    Resolved cardiomyopathy with EF back to baseline normal.  No beta-blocker because of bradycardia and fatigue issues.  Is on moderate dose ARB.  On DAPT partially because of significant central vascular disease.  Needs better lipid control.

## 2020-09-16 NOTE — Assessment & Plan Note (Signed)
Clearly vascular etiology in nature.  Did well with Viagra in the past.  Will refill prescription for Viagra 100 mg PRN.

## 2020-09-16 NOTE — Assessment & Plan Note (Signed)
He has tried 3 of the standard statins including simvastatin, atorvastatin and rosuvastatin.  Was briefly on Livalo, but stopped taking because of financial issues.  Plan: Recheck lipids, and refer to [facility] to discuss PCSK9 inhibitors.  Perhaps he could be a candidate for inclisiran.

## 2020-09-16 NOTE — Assessment & Plan Note (Addendum)
Smoking cessation instruction/counseling given:  counseled patient on the dangers of tobacco use, advised patient to stop smoking, and reviewed strategies to maximize success   He says he does have patches, just has not yet used them.  I recommend that he actually pick a date and start using patches to stop smoking.  He has extensive vascular disease both cardiac and central.  Lipids not well controlled and smoking is a huge risk factor.  5 minutes

## 2020-10-08 ENCOUNTER — Emergency Department (HOSPITAL_COMMUNITY): Payer: Medicare HMO

## 2020-10-08 ENCOUNTER — Emergency Department (HOSPITAL_COMMUNITY)
Admission: EM | Admit: 2020-10-08 | Discharge: 2020-10-09 | Disposition: A | Discharge status: Home / Self Care | Payer: Medicare HMO | Attending: Emergency Medicine | Admitting: Emergency Medicine

## 2020-10-08 ENCOUNTER — Other Ambulatory Visit: Payer: Self-pay

## 2020-10-08 DIAGNOSIS — Z79899 Other long term (current) drug therapy: Secondary | ICD-10-CM | POA: Insufficient documentation

## 2020-10-08 DIAGNOSIS — I25119 Atherosclerotic heart disease of native coronary artery with unspecified angina pectoris: Secondary | ICD-10-CM | POA: Insufficient documentation

## 2020-10-08 DIAGNOSIS — I10 Essential (primary) hypertension: Secondary | ICD-10-CM | POA: Diagnosis not present

## 2020-10-08 DIAGNOSIS — R079 Chest pain, unspecified: Secondary | ICD-10-CM

## 2020-10-08 DIAGNOSIS — R110 Nausea: Secondary | ICD-10-CM | POA: Insufficient documentation

## 2020-10-08 DIAGNOSIS — Z955 Presence of coronary angioplasty implant and graft: Secondary | ICD-10-CM | POA: Diagnosis not present

## 2020-10-08 DIAGNOSIS — R0789 Other chest pain: Secondary | ICD-10-CM | POA: Diagnosis not present

## 2020-10-08 DIAGNOSIS — F17210 Nicotine dependence, cigarettes, uncomplicated: Secondary | ICD-10-CM | POA: Diagnosis not present

## 2020-10-08 DIAGNOSIS — Z7982 Long term (current) use of aspirin: Secondary | ICD-10-CM | POA: Insufficient documentation

## 2020-10-08 DIAGNOSIS — Z85118 Personal history of other malignant neoplasm of bronchus and lung: Secondary | ICD-10-CM | POA: Insufficient documentation

## 2020-10-08 DIAGNOSIS — R1010 Upper abdominal pain, unspecified: Secondary | ICD-10-CM | POA: Insufficient documentation

## 2020-10-08 DIAGNOSIS — Z20822 Contact with and (suspected) exposure to covid-19: Secondary | ICD-10-CM | POA: Diagnosis not present

## 2020-10-08 DIAGNOSIS — R059 Cough, unspecified: Secondary | ICD-10-CM | POA: Diagnosis not present

## 2020-10-08 LAB — COMPREHENSIVE METABOLIC PANEL
ALT: 10 U/L (ref 0–44)
AST: 14 U/L — ABNORMAL LOW (ref 15–41)
Albumin: 3.1 g/dL — ABNORMAL LOW (ref 3.5–5.0)
Alkaline Phosphatase: 112 U/L (ref 38–126)
Anion gap: 6 (ref 5–15)
BUN: 17 mg/dL (ref 8–23)
CO2: 24 mmol/L (ref 22–32)
Calcium: 9 mg/dL (ref 8.9–10.3)
Chloride: 103 mmol/L (ref 98–111)
Creatinine, Ser: 1.36 mg/dL — ABNORMAL HIGH (ref 0.61–1.24)
GFR, Estimated: 55 mL/min — ABNORMAL LOW (ref >60)
Glucose, Bld: 119 mg/dL — ABNORMAL HIGH (ref 70–99)
Potassium: 3.9 mmol/L (ref 3.5–5.1)
Sodium: 133 mmol/L — ABNORMAL LOW (ref 135–145)
Total Bilirubin: 0.8 mg/dL (ref 0.3–1.2)
Total Protein: 5.7 g/dL — ABNORMAL LOW (ref 6.5–8.1)

## 2020-10-08 LAB — CBC WITH DIFFERENTIAL/PLATELET
Abs Immature Granulocytes: 0.01 10*3/uL (ref 0.00–0.07)
Basophils Absolute: 0 10*3/uL (ref 0.0–0.1)
Basophils Relative: 0 %
Eosinophils Absolute: 0 10*3/uL (ref 0.0–0.5)
Eosinophils Relative: 1 %
HCT: 41.5 % (ref 39.0–52.0)
Hemoglobin: 13.9 g/dL (ref 13.0–17.0)
Immature Granulocytes: 0 %
Lymphocytes Relative: 19 %
Lymphs Abs: 0.9 10*3/uL (ref 0.7–4.0)
MCH: 30.3 pg (ref 26.0–34.0)
MCHC: 33.5 g/dL (ref 30.0–36.0)
MCV: 90.4 fL (ref 80.0–100.0)
Monocytes Absolute: 0.3 10*3/uL (ref 0.1–1.0)
Monocytes Relative: 7 %
Neutro Abs: 3.4 10*3/uL (ref 1.7–7.7)
Neutrophils Relative %: 73 %
Platelets: 228 10*3/uL (ref 150–400)
RBC: 4.59 MIL/uL (ref 4.22–5.81)
RDW: 12.9 % (ref 11.5–15.5)
WBC: 4.7 10*3/uL (ref 4.0–10.5)
nRBC: 0 % (ref 0.0–0.2)

## 2020-10-08 LAB — RESP PANEL BY RT-PCR (FLU A&B, COVID) ARPGX2
Influenza A by PCR: NEGATIVE
Influenza B by PCR: NEGATIVE
SARS Coronavirus 2 by RT PCR: NEGATIVE

## 2020-10-08 LAB — TROPONIN I (HIGH SENSITIVITY): Troponin I (High Sensitivity): 4 ng/L (ref <18)

## 2020-10-08 NOTE — ED Provider Notes (Signed)
  Provider Note MRN:  [id number]  Arrival date & time: 10/09/20    ED Course and Medical Decision Making  Assumed care from Dr. Alvino Chapel at shift change.  Epigastric discomfort, history of aortic dissection status postrepair, similar pain to that event, awaiting CTA.  CTA is normal, no endoleak.  Troponin negative x2, patient continues to look and feel well, normal vital signs, patient feels comfortable with plan to follow-up with cardiology as an outpatient.  Appropriate for discharge.  Procedures  Final Clinical Impressions(s) / ED Diagnoses     ICD-10-CM   1. Chest pain, unspecified type  R07.9       ED Discharge Orders     None         Discharge Instructions      You were evaluated in the Emergency Department and after careful evaluation, we did not find any emergent condition requiring admission or further testing in the hospital.  Your exam/testing today is overall reassuring.  CT scan was normal, labs reassuring with no evidence of heart damage.  Recommend follow-up with your primary care doctor and/or cardiologist to discuss your symptoms.  Please return to the Emergency Department if you experience any worsening of your condition.   Thank you for allowing [place] to be a part of your care.      Barth Kirks. Sedonia Small, [place] [email or web address]    Maudie Flakes, MD 10/09/20 [phone]

## 2020-10-08 NOTE — ED Notes (Signed)
Pt ambulated to restroom with a steady gait

## 2020-10-08 NOTE — ED Provider Notes (Signed)
[facility] EMERGENCY DEPARTMENT Provider Note   CSN: [id number] Arrival date & time: 10/08/20  2117     History Chief Complaint  Patient presents with   Chest Pain    Patrick Adams is a 75 y.o. male.   Chest Pain Associated symptoms: cough and nausea   Associated symptoms: no back pain, no shortness of breath and no weakness   Patient presents with an uneasy feeling and some nausea in his chest.  Has had occasional cough.  States he has nervousness and is upper abdomen.  States the last time he had this it was and leaking aneurysm that had been repaired in his abdomen.  States he has not had pain like this otherwise.  States it is not really pain just feels little different.  No known sick contacts.  No exertional pain.  Slight nausea.  Has followed up recently with vascular surgery and had reassuring CT scan at that time.    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 -- most Recent Echo: EF 45-50%. Mid-Apical Anterior/Anterospetal HKc.a LAD infarct.   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 Memory Dance, annual scans -> until summer/fall 2021 -> noted progression to 6.2 mm aneurysm --> 11/25/221 [facility]- Dr. 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   Sinus pause 07/20/2019   Preoperative cardiovascular examination 07/20/2019   SBO (small bowel obstruction) ([place]) 05/19/2019   Hemochromatosis    Tobacco abuse    Coronary artery disease involving native coronary artery of native heart with angina pectoris ([place]) 01/31/2018   Essential hypertension 01/31/2018   Hyperlipidemia with target LDL less than 70 01/31/2018   ST elevation myocardial infarction (STEMI) of anterior wall ([place]) 01/31/2018   Ischemic cardiomyopathy 01/31/2018   History of abdominal aortic aneurysm (AAA) repair 2008   Thoracic aortic aneurysm ([place]) - descending (residual after AAA repair) 2008    Past Surgical History:  Procedure Laterality Date   ABDOMINAL AORTIC ANEURYSM REPAIR  2008   Aortobiiliac repair ->  Dr. Memory Dance, annual scans   CARDIAC EVENT MONITOR  10/2018   Baseline sinus rhythm with 1 degree AVB.  PVCs noted.  Average heart rate 73 bpm.  3 critical: 1 serious and 8 stable events noted: Critical-4.0-second pauses x3, serious 1 run 6 beat PVCs (NSVT), 3 pauses of 3.2 to 3.5 seconds.  Pauses do have P waves that appear to be blocked on some occasions.  Both junctional escape and normal sinus beats after pauses.   COLONOSCOPY  2016   CORONARY STENT INTERVENTION N/A 02/03/2018   Procedure: CORONARY STENT  INTERVENTION;  Surgeon: Belva Crome, MD;  Location: [facility];;   staged PCI p-mRCA (Synergy DES 3.5 mm x 32 mm--4.0 mm covering entire diseased segment from  proximal 55 through the 85 and 40% lesions in the mid vessel), RPAV (Synergy DES 2.5 mm x 12 mm--3.0 mm)   CORONARY/GRAFT ACUTE MI REVASCULARIZATION N/A 01/31/2018   Procedure: Coronary/Graft Acute MI Revascularization;  Surgeon: Leonie Man, MD;  Location: [place] [facility];; ANTERIOR-INFERIOR STEMI: pLAD 60% - mLAD 100% (PCI-Synergy DES 2.75 mm at 38 mm--4.1-3.6-3.0 mm tapered post dilation)   ENDOVASCULAR RIGHT INTERNAL ILIAC ANEURYSM REPAIR Right 02/06/2020   [facility]; Dr. Sammuel Hines - Stent Graft Placed   LEFT HEART CATH AND CORONARY ANGIOGRAPHY N/A 01/31/2018   Procedure: LEFT HEART CATH AND CORONARY ANGIOGRAPHY;  Surgeon: Leonie Man, MD;  Location: [facility];;; (anterior inferior STEMI) -> pLAD 60% - mLAD 100% (DES PCI); p-m RCA 55%, mRCA 85%, mRCA 40% (diffuse segmental disease), RPAV 90% (ulcerated), OM1 65%.  EF 35-45%, moderately elevated LVEDP --acute combined systolic and diastolic heart failure, ischemic cardiomyopathy   LEFT HEART CATH AND CORONARY ANGIOGRAPHY N/A 02/03/2018   Procedure: LEFT HEART CATH AND CORONARY ANGIOGRAPHY;  Surgeon: Belva Crome, MD;  Location: [place] [facility];  Service: Cardiovascular;  Laterality: N/A;   LEFT HEART CATH AND CORONARY ANGIOGRAPHY N/A 03/19/2018   Procedure: LEFT HEART CATH AND CORONARY ANGIOGRAPHY;  Surgeon: Martinique, Peter M, MD;  Location: [place] [facility];  Service: Cardiovascular;  Patent stent in p-mLAD, patent p-m RCA stent and RPAV stent.  Distal LAD 70%, OM1 65%.  EF 55-60%.   TEVAR  2/8 & 05/12/2019   [facility]-Dr. Sammuel Hines: (for type I a endoleak of FEVAR from 01/2019)  05/02/2019: TEVAR - COOK Alpha 34 x 34 x 209 (RFA); 05/12/19 - prox cuff extension TEVAR ([place]ore 37 mm x 15 mm, LCFA).   THORACOABDOMINAL AORTIC ANEURYSM REPAIR  02/23/2019   [facility]-Dr. Sammuel Hines: admitted for  lumbar drain insertion followed by aneurysmal repair 4V FEVAR (CA, SMA, RRA, LRA - Cook Branced Device - Bilateral Femoral Access & L Brachial Cutdown).   TRANSTHORACIC ECHOCARDIOGRAM  02/01/2018   (Insetting of anterior-inferior STEMI) --> moderate LVH.  EF 40 to 45%.  Anterior, anteroseptal and apical severe hypokinesis to akinesis.  Indeterminate LV filling pressures.  Aortic sclerosis, no stenosis.   TRANSTHORACIC ECHOCARDIOGRAM  01/17/2019   [facility]: 01/17/2019: EF 55%-normal wall motion..  Mild RV dilation.  Aortic sclerosis but no stenosis   TRANSTHORACIC ECHOCARDIOGRAM  07/2019   EF 45-50%.Moderate HK of mid-apical anterior-anteroseptal wall (c/w LAD MI). Mild-Mod AoV sclerosis w/o Stenosis. .       Family History  Problem Relation Age of Onset   Colonic polyp Mother    Heart disease Father    Liver disease Father     Social History   Tobacco  Use   Smoking status: Every Day    Packs/day: 0.50    Pack years: 0.00    Types: Cigarettes   Smokeless tobacco: Never  Vaping Use   Vaping Use: Never used  Substance Use Topics   Alcohol use: No   Drug use: No    Home Medications Prior to Admission medications   Medication Sig Start Date End Date Taking? Authorizing Provider  aspirin EC 81 MG tablet Take 81 mg by mouth at bedtime.    [provider]  cholecalciferol (VITAMIN D3) 25 MCG (1000 UT) tablet Take 1,000 Units by mouth daily.    [provider]  clopidogrel (PLAVIX) 75 MG tablet TAKE 1 TABLET BY MOUTH EVERY DAY 02/28/20   Leonie Man, MD  losartan (COZAAR) 50 MG tablet TAKE 1 TABLET BY MOUTH EVERY DAY 01/17/20   Leonie Man, MD  nitroGLYCERIN (NITROSTAT) 0.4 MG SL tablet Place 1 tablet (0.4 mg total) under the tongue every 5 (five) minutes as needed for chest pain. 07/20/19 07/19/20  Leonie Man, MD  polyethylene glycol (MIRALAX / GLYCOLAX) 17 g packet Take 17 g by mouth daily as needed (Constipation). 05/24/19   Shelda Pal, DO  sildenafil (VIAGRA) 100 MG tablet Take 1 tablet (100 mg total) by mouth daily as needed for erectile dysfunction. 09/10/20   Leonie Man, MD    Allergies    Brilinta [ticagrelor], Ciprofloxacin, and Statins  Review of Systems   Review of Systems  Constitutional:  Negative for activity change.  HENT:  Negative for congestion.   Respiratory:  Positive for cough. Negative for shortness of breath.   Cardiovascular:  Positive for chest pain.  Gastrointestinal:  Positive for nausea.  Genitourinary:  Negative for flank pain.  Musculoskeletal:  Negative for back pain.  Skin:  Negative for rash.  Neurological:  Negative for weakness.  Psychiatric/Behavioral:  Negative for confusion.    Physical Exam Updated Vital Signs BP (!) 146/79   Pulse (!) 57   Temp 98.7 F (37.1 C) (Oral)   Resp (!) 22   Ht 6' (1.829 m)   Wt 72.6 kg   SpO2 98%   BMI 21.70 kg/m   Physical Exam Vitals and nursing note reviewed.  HENT:     Head: Normocephalic.  Cardiovascular:     Rate and Rhythm: Normal rate and regular rhythm.  Pulmonary:     Breath sounds: No decreased breath sounds or wheezing.  Chest:     Chest wall: No tenderness.  Abdominal:     Tenderness: There is no abdominal tenderness.  Musculoskeletal:     Cervical back: Neck supple.     Right lower leg: No edema.     Left lower leg: No edema.  Skin:    General: Skin is warm.     Capillary Refill: Capillary refill takes less than 2 seconds.  Neurological:     Mental Status: He is alert.    ED Results / Procedures / Treatments   Labs (all labs ordered are listed, but only abnormal results are displayed) Labs Reviewed  COMPREHENSIVE METABOLIC PANEL - Abnormal; Notable for the following components:      Result Value   Sodium 133 (*)    Glucose, Bld 119 (*)    Creatinine, Ser 1.36 (*)    Total Protein 5.7 (*)    Albumin 3.1 (*)    AST 14 (*)    GFR, Estimated 55 (*)    All other components within  normal limits   RESP PANEL BY RT-PCR (FLU A&B, COVID) ARPGX2  CBC WITH DIFFERENTIAL/PLATELET  TROPONIN I (HIGH SENSITIVITY)  TROPONIN I (HIGH SENSITIVITY)    EKG None  Radiology DG Chest Portable 1 View  Result Date: 10/08/2020 CLINICAL DATA:  Chest pain EXAM: PORTABLE CHEST 1 VIEW COMPARISON:  06/04/2018 FINDINGS: There is a thoracic aortic endograft/stent. There is scarring within the right lung. Cardiomediastinal size is normal. No focal airspace consolidation. No pleural effusion. IMPRESSION: No acute abnormality.  Scarring in the right lung. Electronically Signed   By: Ulyses Jarred M.D.   On: 10/08/2020 22:35    Procedures Procedures   Medications Ordered in ED Medications - No data to display  ED Course  I have reviewed the triage vital signs and the nursing notes.  Pertinent labs & imaging results that were available during my care of the patient were reviewed by me and considered in my medical decision making (see chart for details).    MDM Rules/Calculators/A&P                          Patient with strange feeling in abdomen and lower chest.  Patient is concerned it is a new or leaking aneurysm.  History of same.  Will get EKG and blood work.  Will get CT angiography of belly and chest.  Care turned over to Dr. Sedonia Small  Final Clinical Impression(s) / ED Diagnoses Final diagnoses:  None    Rx / DC Orders ED Discharge Orders     None        Davonna Belling, MD 10/09/20 0000

## 2020-10-08 NOTE — ED Triage Notes (Signed)
Pt BIBA from home c/o constant Nausea, light headedness, and nervousness since 10/06/2020. P t had previous feeling x26yr ago which resulted in a stent leakage. Pt denies Pain. A&O x4.

## 2020-10-09 LAB — TROPONIN I (HIGH SENSITIVITY): Troponin I (High Sensitivity): 6 ng/L (ref <18)

## 2020-10-09 MED ORDER — IOHEXOL 350 MG/ML SOLN
100.0000 mL | Freq: Once | INTRAVENOUS | Status: AC | PRN
  Administered 2020-10-09: 100 mL via INTRAVENOUS

## 2020-10-09 NOTE — Discharge Instructions (Addendum)
You were evaluated in the Emergency Department and after careful evaluation, we did not find any emergent condition requiring admission or further testing in the hospital.  Your exam/testing today is overall reassuring.  CT scan was normal, labs reassuring with no evidence of heart damage.  Recommend follow-up with your primary care doctor and/or cardiologist to discuss your symptoms.  Please return to the Emergency Department if you experience any worsening of your condition.   Thank you for allowing [place] to be a part of your care.

## 2020-10-09 NOTE — ED Notes (Signed)
Pt ambulated with a steady gait to restroom

## 2020-10-11 ENCOUNTER — Ambulatory Visit: Payer: Medicare HMO

## 2020-11-16 ENCOUNTER — Other Ambulatory Visit: Payer: Self-pay | Admitting: Cardiology

## 2020-12-29 IMAGING — CT CT ANGIO CHEST-ABD-PELV FOR DISSECTION W/ AND WO/W CM
2 of 7 series · 11 of 46 positions shown, 12 images · IV contrast (OMNI 350)
Comparison: 05/07/2019 CT angiogram of the chest, abdomen and
pelvis.

CLINICAL DATA: History of thoracoabdominal aortic aneurysm status
post stent graft repair complicated by endoleak requiring additional
stent graft placement 05/07/2019. Patient presents with abdominal
pain.

EXAM:
CT ANGIOGRAPHY CHEST, ABDOMEN AND PELVIS
TECHNIQUE: Multidetector CT imaging through the chest, abdomen and pelvis was
performed using the standard protocol during bolus administration of
intravenous contrast. Multiplanar reconstructed images and MIPs were
obtained and reviewed to evaluate the vascular anatomy.
CONTRAST:  100mL OMNIPAQUE IOHEXOL 350 MG/ML SOLN

[Series 8: dissection 2mm · axial · 0.78mm/px · z∈[+836,+1402]mm · 8 of 359 slices shown, 9 images]
[im 38/359  soft-tissue]
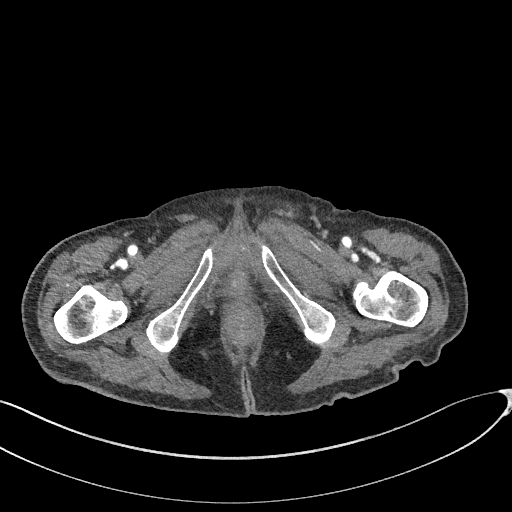
[im 38/359  bone]
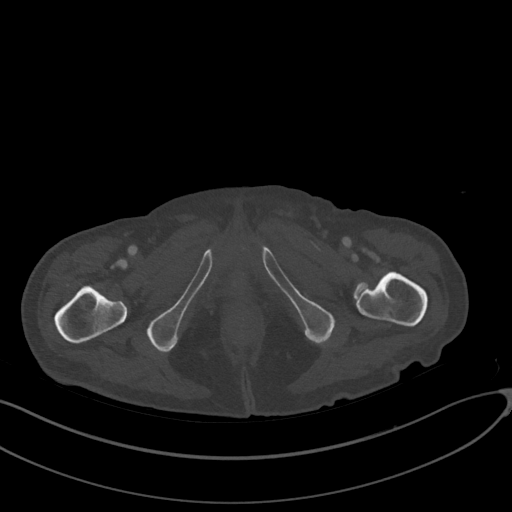
[im 76/359  soft-tissue]
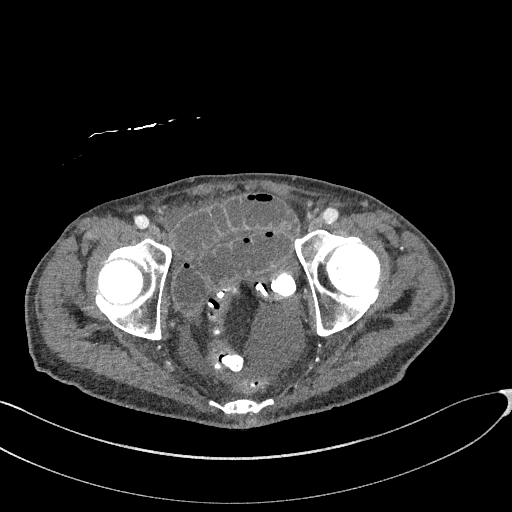
[im 114/359  soft-tissue]
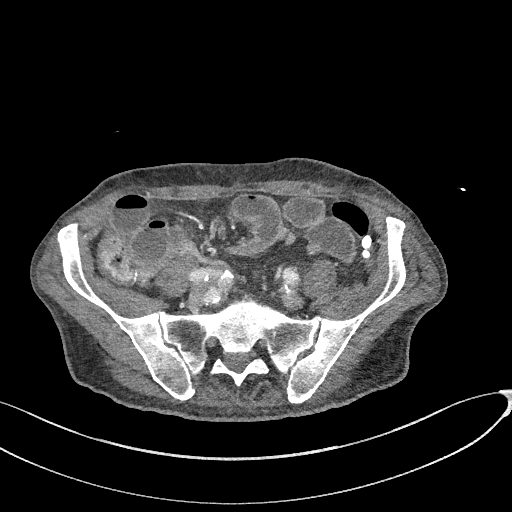
[im 151/359  soft-tissue]
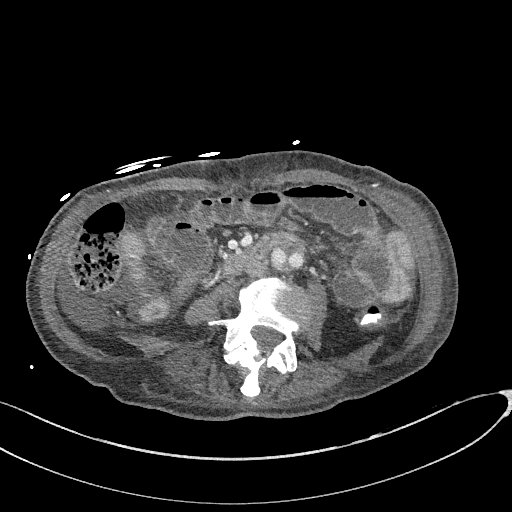
[im 208/359  soft-tissue]
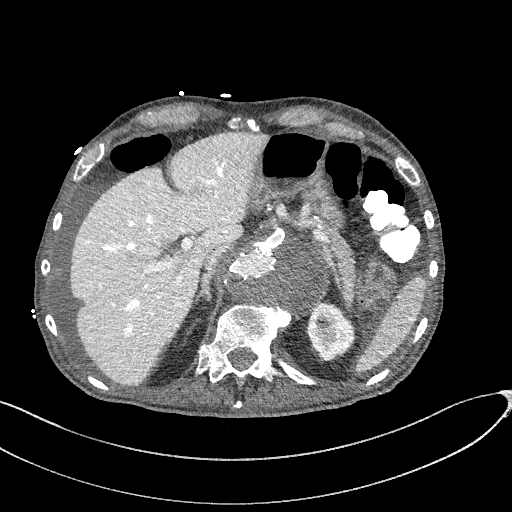
[im 245/359  soft-tissue]
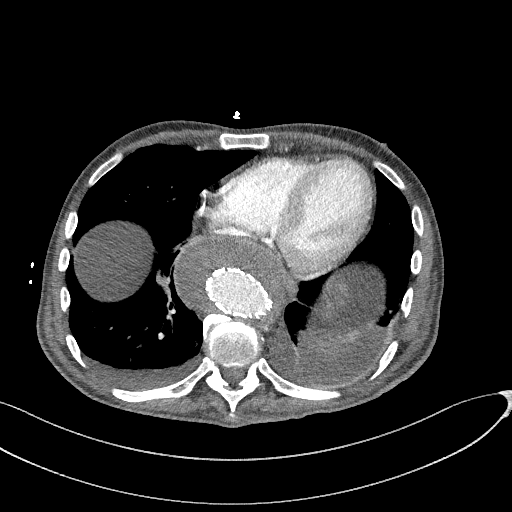
[im 283/359  soft-tissue]
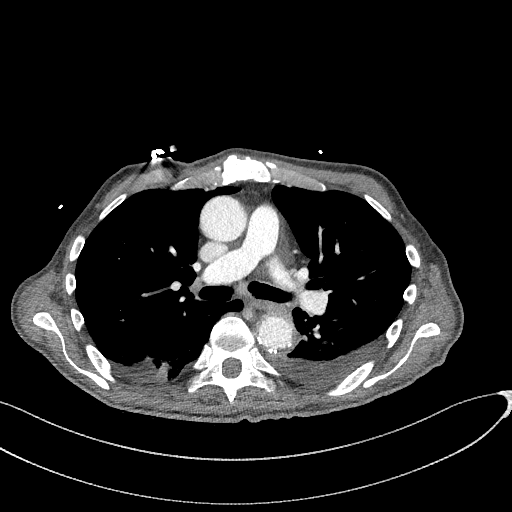
[im 321/359  soft-tissue]
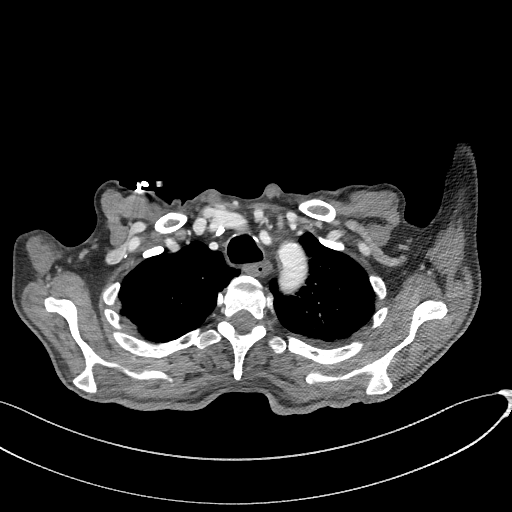

[Series 11: dissection 2mm cor · coronal · 0.77mm/px · 3 of 145 slices shown]
[im 37/145  soft-tissue]
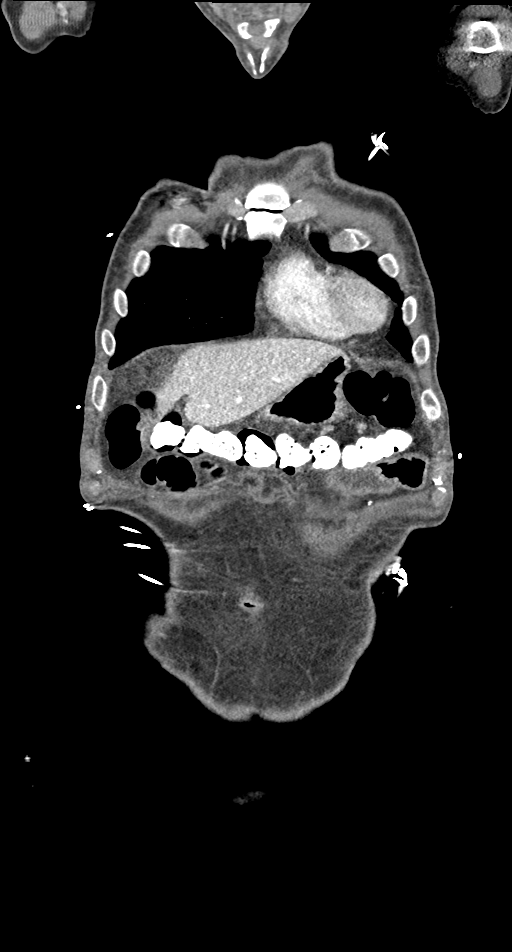
[im 73/145  soft-tissue]
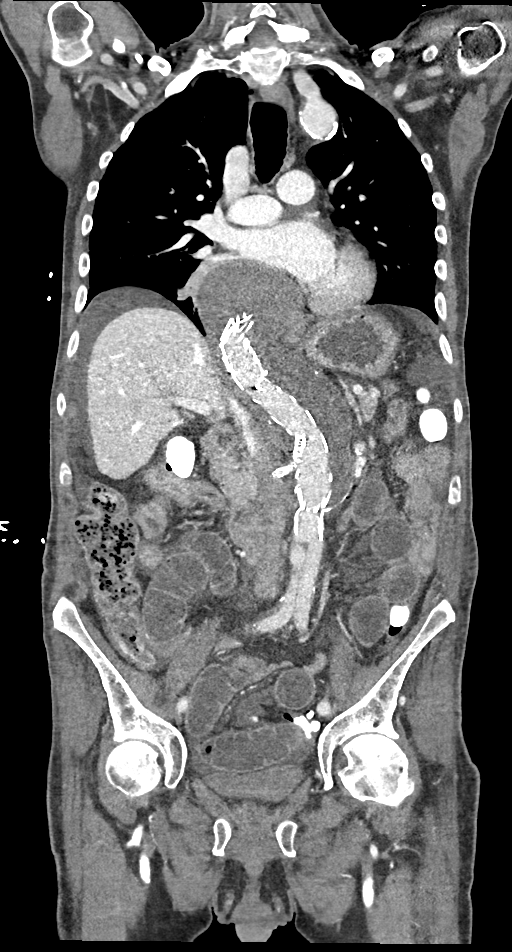
[im 109/145  soft-tissue]
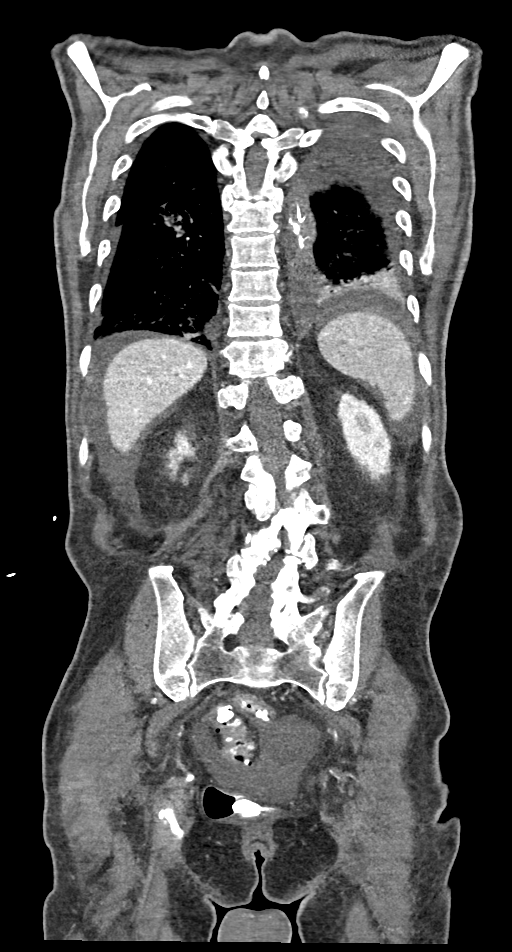

[11 of 46 positions shown; findings below may reference images not displayed]

FINDINGS: CTA CHEST FINDINGS

Cardiovascular: Normal heart size. No significant pericardial
effusion/thickening. Left anterior descending and right coronary
atherosclerosis. Atherosclerotic thoracic aorta. Long segment
thoracoabdominal aortic aneurysm extending from the mid descending
thoracic aorta into the infrarenal abdominal aorta status post stent
graft repair, with interval placement of stent grafts in entire
descending thoracic aorta with previous stent graft in the abdominal
aorta. Descending thoracic aorta is tortuous. The lower descending
thoracic aortic aneurysm sac measures 8.1 cm maximum diameter
(series 11/image 87), compared to 8.3 cm on 05/07/2019, slightly
decreased. Tiny focus of nonspecific gas within descending thoracic
aortic aneurysm sac. No acute intramural hematoma or acute
dissection in the thoracic aorta. Aortic arch branch vessels are
patent. No appreciable endoleak in the thoracic aorta. Normal
caliber main pulmonary artery. No central pulmonary emboli.

Mediastinum/Nodes: No discrete thyroid nodules. Unremarkable
esophagus. No pathologically enlarged axillary, mediastinal or hilar
lymph nodes.

Lungs/Pleura: No pneumothorax. Small dependent bilateral pleural
effusions. Mild centrilobular and paraseptal emphysema. Stable 4 mm
basilar right upper lobe solid pulmonary nodule along the minor
fissure (series 5/image 34). Sub solid 4.6 x 2.5 cm masslike opacity
in the superior segment right lower lobe (series 5/image 29) with
2.4 cm solid component, unchanged since 05/07/2019 chest CT using
similar measurement technique. Passive atelectasis in the left
greater than right lower lobes. No new significant pulmonary
nodules.

Musculoskeletal: No aggressive appearing focal osseous lesions. Mild
thoracic spondylosis.

Review of the MIP images confirms the above findings.

CTA ABDOMEN AND PELVIS FINDINGS

VASCULAR

Aorta: Prior abdominal aortic stent graft repair long segment
suprarenal and infrarenal abdominal aortic aneurysm, with maximum
abdominal aortic aneurysm sac diameter measuring 6.7 cm in the
suprarenal abdominal aorta (series 11/image 81), unchanged. No
evidence of an endoleak in abdominal aorta. Prior aorto bi-iliac
surgical graft repair, which appears patent.

Celiac: Stable stent within the celiac trunk, which appears patent.

SMA: Stable proximal SMA stent, which demonstrates approximately 50%
luminal narrowing at the origin (series 14/image 119), slightly
worsened since 05/07/2019. SMA patent distal to the SMA stent with
no thrombosis.

Renals: Stable appearance of the bilateral proximal renal artery
stents, which appear patent without high-grade stenosis.

IMA: Chronic occlusion.

Inflow: No high-grade iliac graft or common femoral artery stenoses.
Stable 3.0 cm right internal iliac artery aneurysm.

Veins: No obvious venous abnormality within the limitations of this
arterial phase study.

Review of the MIP images confirms the above findings.

NON-VASCULAR

Hepatobiliary: Normal liver size. Stable subcentimeter hypodense
anterior liver lesion, too small to characterize. No new liver
lesions. Cholecystectomy. No biliary ductal dilatation.

Pancreas: Normal, with no mass or duct dilation.

Spleen: Normal size. No mass.

Adrenals/Urinary Tract: Normal adrenals. Stable scattered
subcentimeter hypodense renal cortical lesions, too small to
characterize, requiring no follow-up. No new renal lesions. No
hydronephrosis. Normal bladder.

Stomach/Bowel: Normal non-distended stomach. Several mildly dilated
small bowel loops throughout the abdomen up to 3.1 cm diameter, with
air-fluid levels. No definite small bowel wall thickening or
pneumatosis. Distal small bowel is collapsed. No discrete small
bowel caliber transition identified. Appendix not discretely
visualized. Mild sigmoid diverticulosis, with no large bowel wall
thickening or significant pericolonic fat stranding.

Vascular/Lymphatic: No pathologically enlarged lymph nodes in the
abdomen or pelvis.

Reproductive: Mild prostatomegaly. Nonspecific internal prostatic
calcifications.

Other: No pneumoperitoneum. Small volume ascites, mildly increased
from prior. No focal fluid collections.

Musculoskeletal: No aggressive appearing focal osseous lesions.
Moderate thoracic spondylosis.

Review of the MIP images confirms the above findings.
IMPRESSION: 1. Mildly dilated small bowel loops throughout the abdomen with
collapsed distal small bowel. Findings are concerning for mild
mechanical distal small bowel obstruction, although no discrete
caliber transition is identified. Differential includes adynamic
ileus. No free air.
2. Subsolid 4.6 cm superior segment right lower lobe masslike
opacity with 2.4 cm solid component, persistent and unchanged since
recent CT. Primary bronchogenic carcinoma not excluded in this high
risk patient with smoking related changes in the lungs. Outpatient
PET-CT suggested for further evaluation when clinically feasible.
3. Long segment thoracoabdominal aortic aneurysm status post stent
graft repair, with interval placement of stent grafts in the
descending thoracic aorta. No evidence of an endoleak. The lower
descending thoracic aortic aneurysm sac measures 8.1 cm maximum
diameter, slightly decreased. Tiny focus of nonspecific gas within
the descending thoracic aortic aneurysm sac, presumably due to
recent intervention.
4. Narrowing of the SMA stent at the origin, approximately 50%,
appearing slightly worsened. SMA remains patent.
5. Small dependent bilateral pleural effusions. Passive atelectasis
in the left greater than right lower lobes.
6. Aortic Atherosclerosis (Y526M-Y69.9) and Emphysema (Y526M-4EP.L).

## 2020-12-29 IMAGING — DX DG ABDOMEN ACUTE W/ 1V CHEST
3 series · 3 of 3 positions shown · non-contrast
Comparison: CT angio chest abdomen and pelvis 05/07/2019. Chest
x-ray 02/01/2018.

CLINICAL DATA: Chronic 8 month history of midline abdominal pain
associated with nausea and vomiting. Prior thoracic and abdominal
aortic stent grafting.

EXAM:
DG ABDOMEN ACUTE W/ 1V CHEST

[abdomen erect]
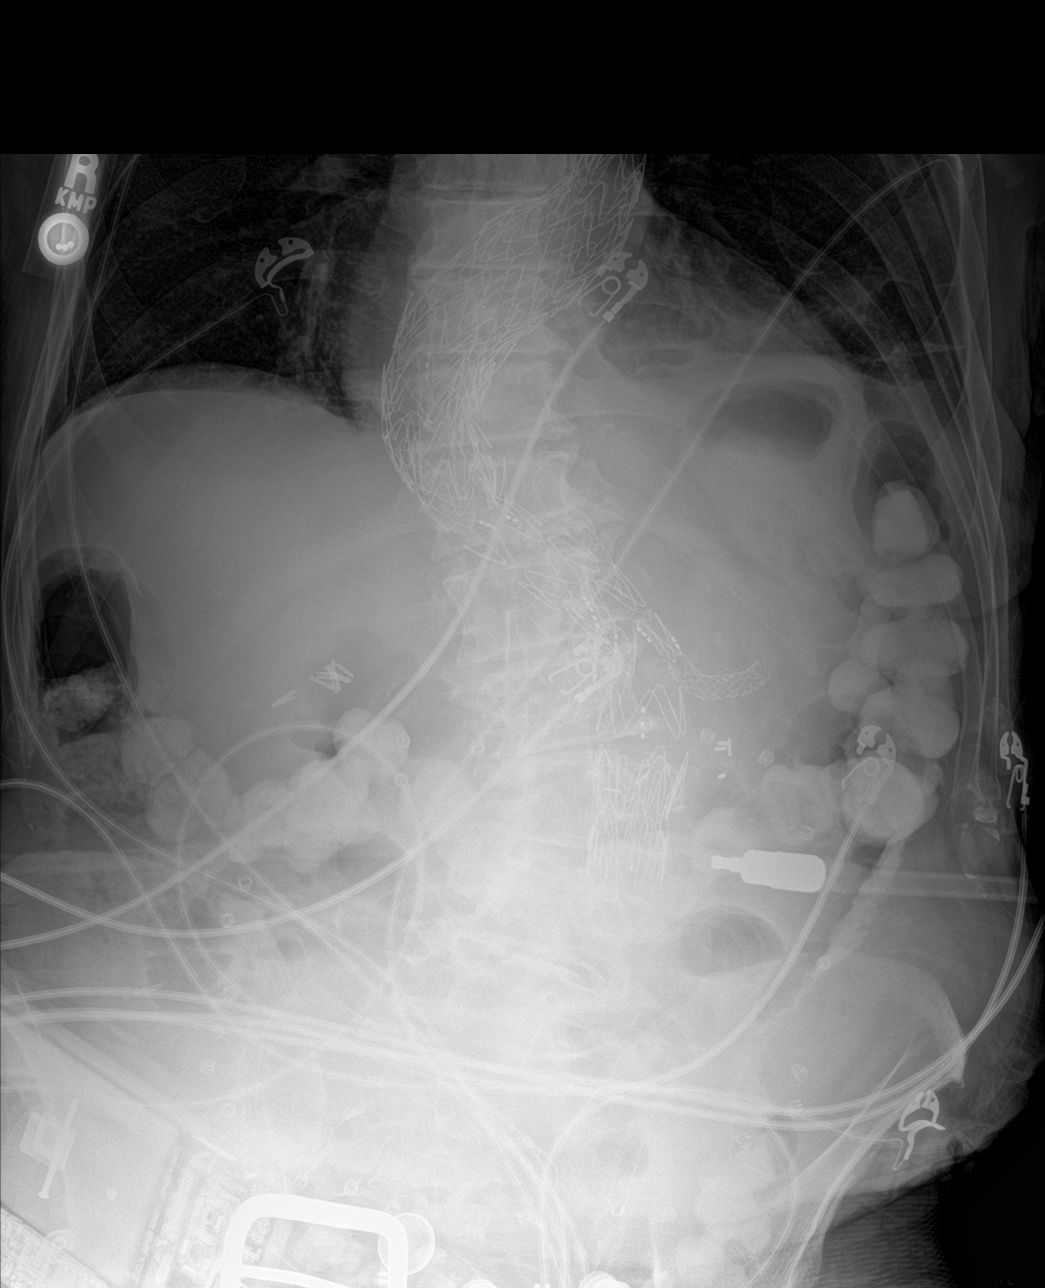

[abdomen supine]
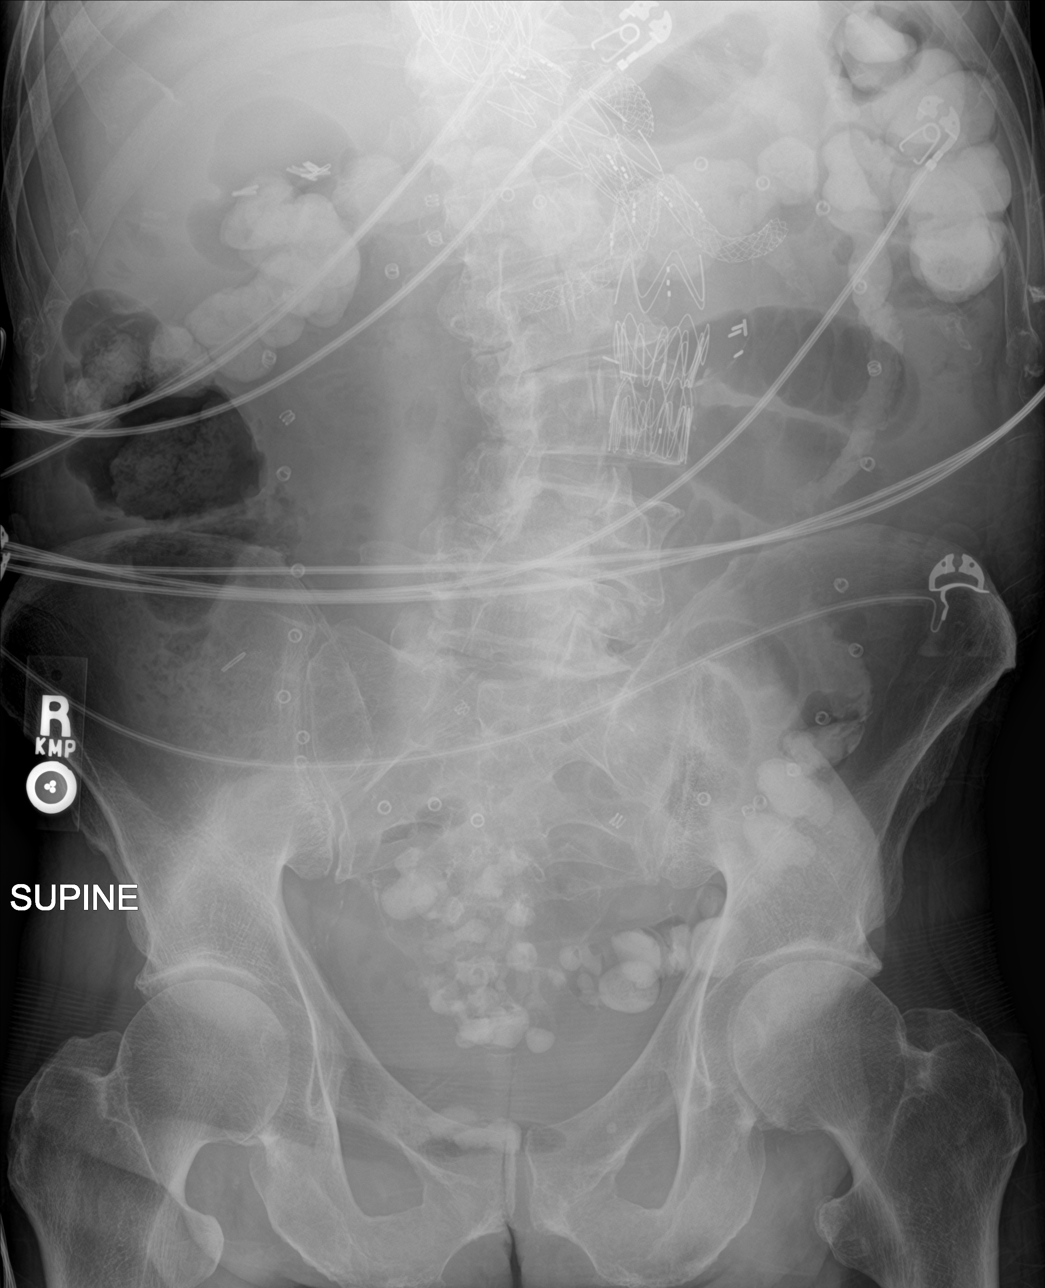

[chest ap]
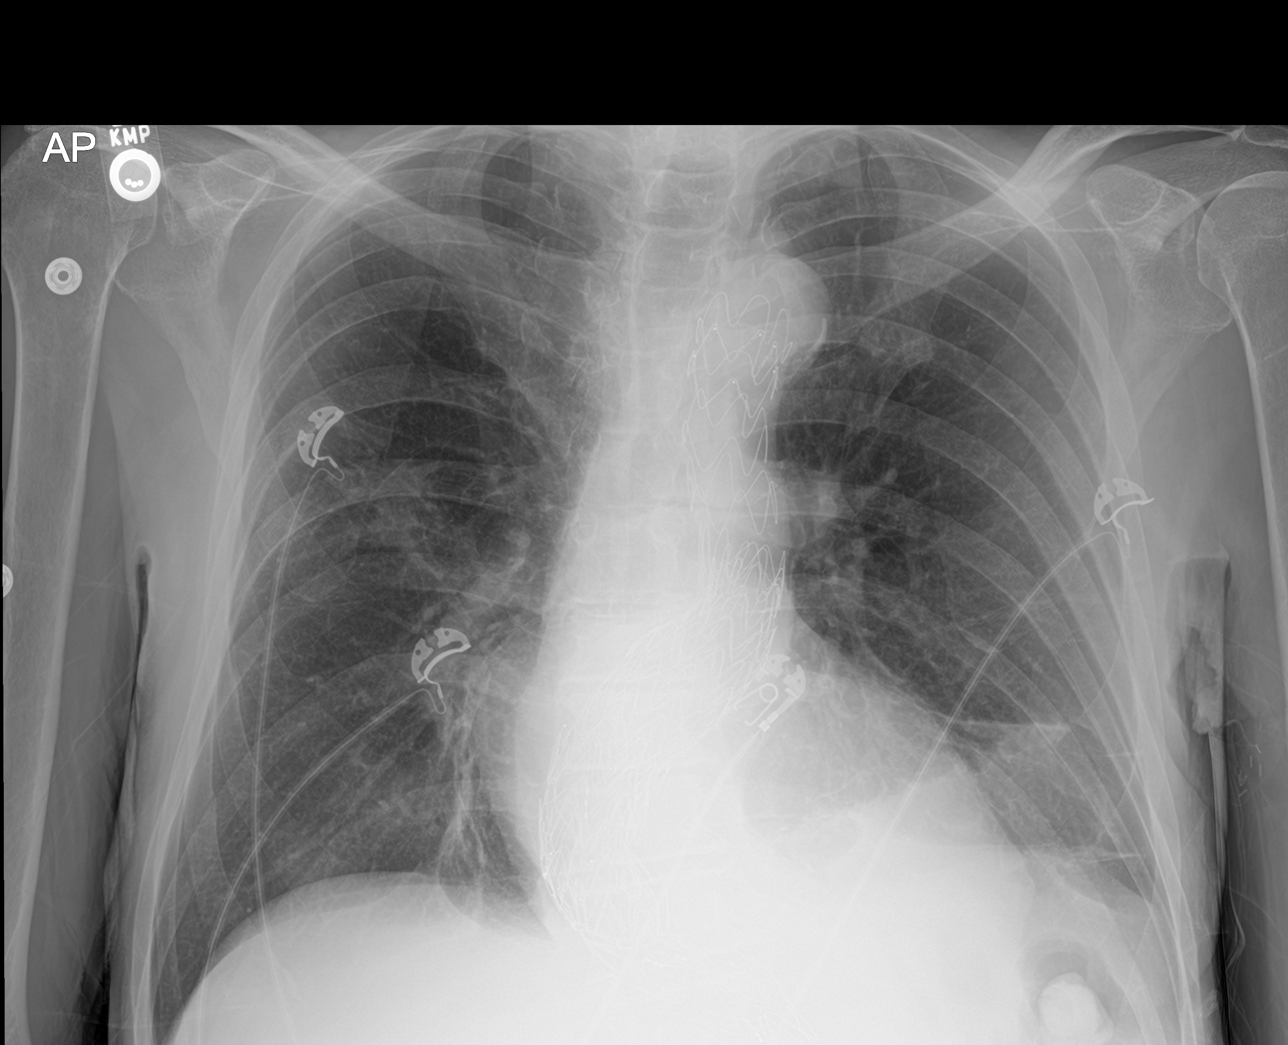

[3 of 3 positions shown; findings below may reference images not displayed]

FINDINGS: Mild gaseous distension of adjacent loops of jejunum in the LEFT
UPPER QUADRANT demonstrating air-fluid levels on the ERECT image. No
free intraperitoneal air. Expected stool burden and residual
contrast material throughout the normal caliber and decompressed
colon, with numerous diverticula involving the sigmoid colon; it is
unclear where the contrast material originated, as it was not given
for the recent CTA and there are no interval imaging studies since
then.

Thoracic and abdominal aortic stent grafting with stenting of the
renal arteries.

Cardiac silhouette mildly enlarged, unchanged. The opacity in the
superior segment RIGHT LOWER LOBE identified on the recent CTA has
improved is visible though much less conspicuous on the chest x-ray.
Linear scarring is present in the RIGHT LOWER LOBE in a vertical
orientation, unchanged. New linear atelectasis at the LEFT lung
base.
IMPRESSION: 1. Partial small bowel obstruction involving the jejunum in the LEFT
UPPER QUADRANT. No free intraperitoneal air.
2. Diverticulosis involving the sigmoid colon.
3. The superior segment RIGHT LOWER LOBE opacity identified on the
recent CTA is still present though much less conspicuous on chest
x-ray. The recommendation for a follow-up CT in 3 months, early July 2019, remains.
4. New atelectasis involving the LEFT lung base. No acute
cardiopulmonary disease otherwise.

## 2021-03-18 ENCOUNTER — Ambulatory Visit: Payer: Medicare HMO | Admitting: Cardiology

## 2021-03-18 ENCOUNTER — Encounter: Payer: Self-pay | Admitting: Cardiology

## 2021-03-18 ENCOUNTER — Other Ambulatory Visit: Payer: Self-pay

## 2021-03-18 VITALS — BP 152/72 | HR 73 | Ht 72.0 in | Wt 171.2 lb

## 2021-03-18 DIAGNOSIS — G720 Drug-induced myopathy: Secondary | ICD-10-CM

## 2021-03-18 DIAGNOSIS — I7123 Aneurysm of the descending thoracic aorta, without rupture: Secondary | ICD-10-CM | POA: Diagnosis not present

## 2021-03-18 DIAGNOSIS — I25119 Atherosclerotic heart disease of native coronary artery with unspecified angina pectoris: Secondary | ICD-10-CM

## 2021-03-18 DIAGNOSIS — I255 Ischemic cardiomyopathy: Secondary | ICD-10-CM | POA: Diagnosis not present

## 2021-03-18 DIAGNOSIS — Z720 Tobacco use: Secondary | ICD-10-CM

## 2021-03-18 DIAGNOSIS — I10 Essential (primary) hypertension: Secondary | ICD-10-CM

## 2021-03-18 DIAGNOSIS — T466X5D Adverse effect of antihyperlipidemic and antiarteriosclerotic drugs, subsequent encounter: Secondary | ICD-10-CM

## 2021-03-18 DIAGNOSIS — E785 Hyperlipidemia, unspecified: Secondary | ICD-10-CM

## 2021-03-18 DIAGNOSIS — T466X5A Adverse effect of antihyperlipidemic and antiarteriosclerotic drugs, initial encounter: Secondary | ICD-10-CM

## 2021-03-18 MED ORDER — LOSARTAN POTASSIUM 100 MG PO TABS: 100.0000 mg | ORAL_TABLET | Freq: Every day | ORAL | 3 refills | Status: DC

## 2021-03-18 NOTE — Patient Instructions (Addendum)
Medication Instructions:   Increase Losartan 100 mg   daily   *If you need a refill on your cardiac medications before your next appointment, please call your pharmacy*   Lab Work:  Lipid CMP- Jan 2023  If you have labs (blood work) drawn today and your tests are completely normal, you will receive your results only by: [place] (if you have MyChart) OR A paper copy in the mail If you have any lab test that is abnormal or we need to change your treatment, we will call you to review the results.   Testing/Procedures: Not needed   Follow-Up: At [facility], you and your health needs are our priority.  As part of our continuing mission to provide you with exceptional heart care, we have created designated Provider Care Teams.  These Care Teams include your primary Cardiologist (physician) and Advanced Practice Providers (APPs -  Physician Assistants and Nurse Practitioners) who all work together to provide you with the care you need, when you need it.  We recommend signing up for the patient portal called "MyChart".  Sign up information is provided on this After Visit Summary.  MyChart is used to connect with patients for Virtual Visits (Telemedicine).  Patients are able to view lab/test results, encounter notes, upcoming appointments, etc.  Non-urgent messages can be sent to your provider as well.   To learn more about what you can do with MyChart, go to [email or web address].    Your next appointment:   12 month(s)  The format for your next appointment:   In Person  Provider:   Glenetta Hew, MD     Other Instructions  You have been referred to [facility] - lipid clinic and blood pressure  at [facility] office discuss cholesterol options

## 2021-03-18 NOTE — Progress Notes (Signed)
Primary Care Provider: [name], MD Cardiologist: Glenetta Hew, MD Electrophysiologist: None  Clinic Note: Chief Complaint  Patient presents with   Follow-up    6 months.  Stable   Coronary Artery Disease    No angina or CHF   PAD    Has claudication, but not limiting.    ===================================  ASSESSMENT/PLAN   Problem List Items Addressed This Visit       Cardiology Problems   Coronary artery disease involving native coronary artery of native heart with angina pectoris (HCC) (Chronic)    Extensive PCI to the RCA and LAD in the setting of inferior STEMI.  EF improved.  No recurrent angina.  No CHF.  Plan: Continue ARB.  Not on beta-blocker because of COPD which error tumorectomy beta-1 selective. Statin intolerant-referring to CVRR lipid clinic. Discontinue aspirin and continue Plavix monotherapy. Okay to hold Plavix 5 to 7 days preop for surgery or procedures.      Relevant Medications   losartan (COZAAR) 100 MG tablet   Other Relevant Orders   Lipid panel   Comprehensive metabolic panel   AMB Referral to [facility]   Ischemic cardiomyopathy (Chronic)    EF now back up to baseline.  Preserved EF with no CHF symptoms.  Dyspnea seems more related to COPD than anginal or CHF related. On losartan 50 mg-we will increase to 100 mg due to elevated blood pressures.      Relevant Medications   losartan (COZAAR) 100 MG tablet   Other Relevant Orders   Lipid panel   Comprehensive metabolic panel   AMB Referral to [facility] Pharm-D   Essential hypertension (Chronic)    Hypertensive today.  Increase losartan to 100 mg daily.  Check chemistry panel with lipids in January.  Also reassess blood pressure.  If still elevated, would ask him to CVRR to also adjust medications.      Relevant Medications   losartan (COZAAR) 100 MG tablet   Hyperlipidemia with target LDL less than 70 - Primary (Chronic)    Clearly not at goal.  We will recheck  lipids in January 2023.  Apparently he has tried atorvastatin, rosuvastatin, simvastatin all with side effects and Livalo was not affordable.  Most recent labs were from March, stenotic control.  We will check again in January but referred to our clinical pharmacist to initiate PCSK9 inhibitor.      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  Per vascular surgery, okay to be on monotherapy with either aspirin or Plavix.  We will simply continue Plavix and discontinue aspirin.  -> Okay to hold 5-7 days preop for surgeries or procedures.  (If possible would prefer to use aspirin 81 mg while holding Plavix)      Relevant Medications   losartan (COZAAR) 100 MG tablet   Other Relevant Orders   Lipid panel   Comprehensive metabolic panel     Other   Tobacco abuse (Chronic)    Smoking cessation instruction/counseling given:  counseled patient on the dangers of tobacco use, advised patient to stop smoking, and reviewed strategies to maximize success       Statin myopathy (Chronic)    He  has tried simvastatin, atorvastatin and rosuvastatin all of myalgias weakness.  Tried Livalo, stopped taking Mobic as a financial issues then because of symptoms.  Plan is to recheck lipids and refer to CVRR lipid clinic.  This was a plan during last visit, but he did not follow through.      Relevant Orders   Lipid panel   Comprehensive metabolic panel   AMB Referral to [facility]    ===================================  HPI:    Patrick Adams is a 75 y.o. male with a complex cardiovascular history noted below who presents today for 20-monthfollow-up.  CAD: Anterior STEMI January 31, 2018: PCI  to LAD and staged PCI to RCA (see below)  HLD -Statin Myalgia -- >.  Had not been taking Livalo or Zetia AAA-open aortobiiliac repair in 2008 -> extended to TAAA Redo FEV[facility] 02/23/19: TAAA repair with 4 vessel CMD FEVAR (CA, SMA, RRA, LRA) - Dr. FSammuel Hines2/8/21: TEVAR with CDarylene Price34 x 34 x 209 mm due to type 1a endoleak - Dr. PWilhelmenia Blase2/11/21: TEVAR prox cuff extension with Gore 37 mm x 15 cm due to type III endoleak with Dr. 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 --> 6 month f/u - stale    RSeve MonetteApple was last seen on September 10, 2020.  He is doing very well.  Gradually getting back to his pre-MI state.  With putting back on some weight-up to 161 pounds from being elevated and 95 pounds.  Baseline dyspnea but no chest tightness or pressure.  Mostly exertional dyspnea and trivial end of day edema.  He does also claudication with more prolonged walking.  No CHF symptoms no arrhythmias.  Recent Hospitalizations:  10/08/2020: ER visit for chest pain-described as an uneasy feeling in his chest along with nausea and cough.  CT abdomen/chest performed chest  He was [facility] on March 07, 2021 [facility]; was given clearance to stop aspirin and continue Plavix.  Smoking cessation counseling provided.  (He was still smoking a pack a day)  Reviewed  CV studies:    The following studies were reviewed today: (if available, images/films reviewed: From Epic Chart or Care Everywhere) CTA chest-abdomen-pelvis 10/08/2020: No PE.  Emphysematous changes.  Thoracic aortic stent graft in descending thoracic aorta present.  Resolved aneurysmal sac.  No signs of  endoleak.  Abdominal endograft noted in the abdominal aorta extending from infrarenal aorta with subsequent findings of prior aortobiiliac bypass grafting.  Widely patent stent stable aneurysmal sac.  No endoleak.  Right internal iliac stent exclude aneurysm endoleak.  IMA not visualized.   Interval History:   RKAL CHAITreturns today overall doing pretty well.  He is got some abdominal discomfort from an inguinal hernia but no real anginal chest pain or pressure.  He has poor balance which limits his walking along with claudication, but no exertional dyspnea or chest pain.  No PND, orthopnea with trivial edema.  No irregular heartbeats palpitations. He is not very active, but seems to be in pretty good spirits and try to do more than he had been doing.  CV Review of Symptoms (  Summary) Cardiovascular ROS: positive for - dyspnea on exertion and baseline resting dyspnea.  Abdominal pain, exercise intolerance claudication negative for - chest pain, irregular heartbeat, orthopnea, paroxysmal nocturnal dyspnea, rapid heart rate, or syncope/near syncope or TIA/amaurosis fugax and claudication  REVIEWED OF SYSTEMS   Pertinent symptoms listed below, otherwise negative: Exercise intolerance, chronic cough with shortness of breath from COPD. Back pain as well as joint (knees and hip) pain. Mild neuropathy symptoms; poor balance. Inguinal hernia  I have reviewed and (if needed) personally updated the patient's problem list, medications, allergies, past medical and surgical history, social and family history.   PAST MEDICAL HISTORY   Past Medical History:  Diagnosis Date   CAD S/P PCI 01/2018   a) 01/31/2018: p-mLAD 60-100% (DES PCI with 2.75 mm x 38 mm Synergy DES - 4.0-3.6.3.0 mm tapered post-dilation); b) p-mRCA 55%-85%-40% - Synergy DES 3.5 mm x 32 mm -> 4.0 mm; & RPAV 90% --> Synergy DES 2.5 mm x 12 mm -> 3.0 mm;; c) stents patent on re-look Cath 03/19/2018   Diverticulitis    Erectile  dysfunction    Hemochromatosis    Hyperlipidemia    Hypertension    Ischemic cardiomyopathy 01/31/2018   Initial post STEMI EF by echo 40 to 45% --> by cath 1 month later LV gram showed EF 55 to 60%. -- most Recent Echo: EF 45-50%. Mid-Apical Anterior/Anterospetal HKc.a LAD infarct.   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 Memory Dance, annual scans -> until summer/fall 2021 -> noted progression to 6.2 mm aneurysm --> [facility]/25/[facility]- Dr. Sammuel Hines): 4 V branched FEVAR (CA,SMA, R&L Renal) -> 05/09/19: Type 1a Endoleak  - TEVAR & f/u TEVAR cuff Extension (2/11)    PAST SURGICAL HISTORY   Past Surgical History:  Procedure Laterality Date   ABDOMINAL AORTIC ANEURYSM REPAIR  2008   Aortobiiliac repair ->  Dr. Memory Dance, annual scans   CARDIAC EVENT MONITOR  10/2018   Baseline sinus rhythm with 1 degree AVB.  PVCs noted.  Average heart rate 73 bpm.  3 critical: 1 serious and 8 stable events noted: Critical-4.0-second pauses x3, serious 1 run 6 beat PVCs (NSVT), 3 pauses of 3.2 to 3.5 seconds.  Pauses do have P waves that appear to be blocked on some occasions.  Both junctional escape and normal sinus beats after pauses.   COLONOSCOPY  2016   CORONARY STENT INTERVENTION N/A 02/03/2018   Procedure: CORONARY STENT INTERVENTION;  Surgeon: Belva Crome,  MD;  Location: [facility] LAB;;   staged PCI p-mRCA (Synergy DES 3.5 mm x 32 mm--4.0 mm covering entire diseased segment from  proximal 55 through the 85 and 40% lesions in the mid vessel), RPAV (Synergy DES 2.5 mm x 12 mm--3.0 mm)   CORONARY/GRAFT ACUTE MI REVASCULARIZATION N/A 01/31/2018   Procedure: Coronary/Graft Acute MI  Revascularization;  Surgeon: Leonie Man, MD;  Location: [place] CV LAB;; ANTERIOR-INFERIOR STEMI: pLAD 60% - mLAD 100% (PCI-Synergy DES 2.75 mm at 38 mm--4.1-3.6-3.0 mm tapered post dilation)   ENDOVASCULAR RIGHT INTERNAL ILIAC ANEURYSM REPAIR Right 02/06/2020   [facility]; Dr. Sammuel Hines - Stent Graft Placed   LEFT HEART CATH AND CORONARY ANGIOGRAPHY N/A 01/31/2018   Procedure: LEFT HEART CATH AND CORONARY ANGIOGRAPHY;  Surgeon: Leonie Man, MD;  Location: [facility];;; (anterior inferior STEMI) -> pLAD 60% - mLAD 100% (DES PCI); p-m RCA 55%, mRCA 85%, mRCA 40% (diffuse segmental disease), RPAV 90% (ulcerated), OM1 65%.  EF 35-45%, moderately elevated LVEDP --acute combined systolic and diastolic heart failure, ischemic cardiomyopathy   LEFT HEART CATH AND CORONARY ANGIOGRAPHY N/A 02/03/2018   Procedure: LEFT HEART CATH AND CORONARY ANGIOGRAPHY;  Surgeon: Belva Crome, MD;  Location: [place] CV LAB;  Service: Cardiovascular;  Laterality: N/A;   LEFT HEART CATH AND CORONARY ANGIOGRAPHY N/A 03/19/2018   Procedure: LEFT HEART CATH AND CORONARY ANGIOGRAPHY;  Surgeon: Martinique, Peter M, MD;  Location: [place] CV LAB;  Service: Cardiovascular;  Patent stent in p-mLAD, patent p-m RCA stent and RPAV stent.  Distal LAD 70%, OM1 65%.  EF 55-60%.   TEVAR  2/8 & 05/12/2019   [facility]-Dr. Sammuel Hines: (for type I a endoleak of FEVAR from 01/2019)  05/02/2019: TEVAR - COOK Alpha 34 x 34 x 209 (RFA); 05/12/19 - prox cuff extension TEVAR (Gore 37 mm x 15 mm, LCFA).   THORACOABDOMINAL AORTIC ANEURYSM REPAIR  02/23/2019   [facility]-Dr. Sammuel Hines: admitted for lumbar drain insertion followed by  aneurysmal repair 4V FEVAR (CA, SMA, RRA, LRA - Cook Branced Device - Bilateral Femoral Access & L Brachial Cutdown).   TRANSTHORACIC ECHOCARDIOGRAM  02/01/2018   (Insetting of anterior-inferior STEMI) --> moderate LVH.  EF 40 to 45%.  Anterior, anteroseptal and apical severe hypokinesis to akinesis.  Indeterminate LV filling pressures.  Aortic sclerosis, no stenosis.   TRANSTHORACIC ECHOCARDIOGRAM  01/17/2019   [facility]: 01/17/2019: EF 55%-normal wall motion..  Mild RV dilation.  Aortic sclerosis but no stenosis   TRANSTHORACIC ECHOCARDIOGRAM  07/2019   EF 45-50%.Moderate HK of mid-apical anterior-anteroseptal wall (c/w LAD MI). Mild-Mod AoV sclerosis w/o Stenosis. .   Dx Cath Dec 2019:    2008: open aorto-bi-iliac repair of AAA at OSH 02/23/19: TAAA repair with 4 vessel CMD FEVAR (CA, SMA, RRA, LRA) with Dr. Sammuel Hines 05/09/19: TEVAR with Darylene Price 34 x 34 x 209 mm due to type 1a endoleak with Dr. Wilhelmenia Blase 05/12/19: TEVAR proximal cuff extension with Gore 37 mm x 15 cm due to type III endoleak with Dr. Wilhelmenia Blase. 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 Sammuel Hines   There is no immunization history on file for this patient.  MEDICATIONS/ALLERGIES   Current Meds  Medication Sig   acetaminophen (TYLENOL) 500 MG tablet Take 1,000 mg by mouth every 6 (six) hours as needed for moderate pain or headache.   aspirin EC 81 MG tablet Take 81 mg by mouth at bedtime.   cholecalciferol (VITAMIN D3) 25 MCG (1000 UT) tablet Take 1,000 Units by mouth daily.   clopidogrel (PLAVIX) 75 MG tablet TAKE 1 TABLET BY MOUTH EVERY DAY   losartan (COZAAR) 100 MG tablet Take 1 tablet (100 mg total) by mouth daily.   polyethylene glycol (MIRALAX / GLYCOLAX) 17 g packet Take 17 g by mouth daily as needed (Constipation).   sildenafil (VIAGRA) 100 MG tablet  Take 1 tablet (100 mg total) by mouth daily as needed for erectile dysfunction.   [DISCONTINUED] losartan (COZAAR) 50 MG tablet  TAKE 1 TABLET BY MOUTH EVERY DAY    Allergies  Allergen Reactions   Brilinta [Ticagrelor] Shortness Of Breath     Patient was taking 60 mg  Twice a day - changed to Clopidogrel 75 mg  03/02/19   Ciprofloxacin Anaphylaxis   Statins Other (See Comments)    Joint pain.Myalgias -- HAS TRIED LIPITOR, ZOCOR & CRESTOR    SOCIAL HISTORY/FAMILY HISTORY   Reviewed in Epic:  Pertinent findings:  Social History   Tobacco Use   Smoking status: Every Day    Packs/day: 0.50    Types: Cigarettes   Smokeless tobacco: Never  Vaping Use   Vaping Use: Never used  Substance Use Topics   Alcohol use: No   Drug use: No   Social History   Social History Narrative   Divorced, retired   Lives alone   1 daughter   Smoker   No drugs/EtOH    OBJCTIVE -PE, EKG, labs   Wt Readings from Last 3 Encounters:  03/18/21 171 lb 3.2 oz (77.7 kg)  10/08/20 160 lb (72.6 kg)  09/10/20 161 lb 6.4 oz (73.2 kg)    Physical Exam: BP (!) 152/72    Pulse 73    Ht 6' (1.829 m)    Wt 171 lb 3.2 oz (77.7 kg)    SpO2 94%    BMI 23.22 kg/m  Physical Exam Vitals reviewed.  Constitutional:      General: He is not in acute distress.    Appearance: Normal appearance. He is normal weight.     Comments: Healthy-appearing.  Well-groomed.  HENT:     Head: Normocephalic and atraumatic.  Neck:     Vascular: Carotid bruit (Soft bilateral carotid bruits) present. No JVD.  Cardiovascular:     Rate and Rhythm: Normal rate and regular rhythm. Occasional Extrasystoles are present.    Chest Wall: PMI is not displaced.     Pulses: Decreased pulses (Decreased, but palpable pedal pulses).     Heart sounds: S1 normal and S2 normal. Heart sounds are distant. No murmur heard.   No friction rub. No gallop.  Pulmonary:     Effort: Pulmonary effort is normal. No respiratory distress.     Breath sounds: No wheezing, rhonchi or rales.     Comments: Decreased breath sounds throughout.  Nonlabored. Musculoskeletal:         General: No swelling. Normal range of motion.     Cervical back: Normal range of motion and neck supple.  Skin:    General: Skin is warm and dry.  Neurological:     General: No focal deficit present.     Mental Status: He is alert and oriented to person, place, and time.     Gait: Gait abnormal.  Psychiatric:        Mood and Affect: Mood normal.        Behavior: Behavior normal.        Thought Content: Thought content normal.        Judgment: Judgment normal.   Adult ECG Report N/a  Recent Labs:   [place] Related to Creatinine Component 03/07/21  03/07/21  08/23/20   Creatinine 1.36 High  -- 1.37 High   eGFR CKD-EPI (2021) Male 55 Low   53 Low   54 [place] Related to Comprehensive Metabolic  Panel Component 11/09/20 05/10/20  Na 135 135  Potassium 4.4 4.3  Cl 102 101  CO2 29 29  AGAP 4 5  Glucose 115 High  79  BUN 24 High  26 High   Creatinine 1.45 High  1.43 High   Ca 9.6 9.8  ALK PHOS 139 High  127 High   T Bili 0.89 0.54  Total Protein 6.5 6.5  Alb 3.9 3.9  GLOBULIN 2.6 2.6  ALT 8 7  AST 11 10    [facility] Related to Lipid Panel w/Chol/HDL Ratio Component 05/29/20 01/13/18  Cholesterol, Total 214 High  212 High   Triglycerides 134 109  HDL 49 45  VLDL Cholesterol Cal 24 22  LDL 141 High  145 High   Chol/HDL Ratio 4.4  4.7      Lab Results  Component Value Date   CHOL 224 (H) 12/22/2018   HDL 46 12/22/2018   LDLCALC 146 (H) 12/22/2018   TRIG 175 (H) 12/22/2018   CHOLHDL 4.9 12/22/2018   Lab Results  Component Value Date   CREATININE 1.36 (H) 10/08/2020   BUN 17 10/08/2020   NA 133 (L) 10/08/2020   K 3.9 10/08/2020   CL 103 10/08/2020   CO2 24 10/08/2020   CBC Latest Ref Rng & Units 10/08/2020 05/24/2019 05/23/2019  WBC 4.0 - 10.5 K/uL 4.7 5.0 5.2  Hemoglobin 13.0 - 17.0 g/dL 13.9 8.9(L) 8.7(L)  Hematocrit 39.0 - 52.0 % 41.5 26.7(L) 26.4(L)  Platelets 150 - 400 K/uL 228 451(H) 457(H)    Lab Results  Component  Value Date   HGBA1C 4.9 02/01/2018   No results found for: TSH  ==================================================  COVID-19 Education: The signs and symptoms of COVID-19 were discussed with the patient and how to seek care for testing (follow up with PCP or arrange E-visit).    I spent a total of 23 minutes with the patient spent in direct patient consultation.  Additional time spent with chart review  / charting (studies, outside notes, etc): 15 min Total Time: 38 min  Current medicines are reviewed at length with the patient today.  (+/- concerns) N/A  This visit occurred during the SARS-CoV-2 public health emergency.  Safety protocols were in place, including screening questions prior to the visit, additional usage of staff PPE, and extensive cleaning of exam room while observing appropriate contact time as indicated for disinfecting solutions.  Notice: This dictation was prepared with Dragon dictation along with smart phrase technology. Any transcriptional errors that result from this process are unintentional and may not be corrected upon review.  Studies Ordered:   Orders Placed This Encounter  Procedures   Lipid panel   Comprehensive metabolic panel   AMB Referral to [facility] Pharm-D    Patient Instructions / Medication Changes & Studies & Tests Ordered   Patient Instructions  Medication Instructions:   Increase Losartan 100 mg   daily   *If you need a refill on your cardiac medications before your next appointment, please call your pharmacy*   Lab Work:  Lipid CMP- Jan 2023  If you have labs (blood work) drawn today and your tests are completely normal, you will receive your results only by: MyChart Message (if you have MyChart) OR A paper copy in the mail If you have any lab test that is abnormal or we need to change your treatment, we will call you to review the results.   Testing/Procedures: Not needed   Follow-Up: At [facility], you and your  health needs are our priority.  As part of our continuing mission to provide you with exceptional heart care, we have created designated Provider Care Teams.  These Care Teams include your primary Cardiologist (physician) and Advanced Practice Providers (APPs -  Physician Assistants and Nurse Practitioners) who all work together to provide you with the care you need, when you need it.  We recommend signing up for the patient portal called "MyChart".  Sign up information is provided on this After Visit Summary.  MyChart is used to connect with patients for Virtual Visits (Telemedicine).  Patients are able to view lab/test results, encounter notes, upcoming appointments, etc.  Non-urgent messages can be sent to your provider as well.   To learn more about what you can do with MyChart, go to [email or web address].    Your next appointment:   12 month(s)  The format for your next appointment:   In Person  Provider:   Glenetta Hew, MD     Other Instructions  You have been referred to CVRR - lipid clinic and blood pressure  at [facility] office discuss cholesterol options       Glenetta Hew, M.D., M.S. Interventional Cardiologist   Pager # [phone] Phone # [phone] [address] [place], [place] [address]   Thank you for choosing [facility] at [facility]!!

## 2021-04-01 ENCOUNTER — Encounter: Payer: Self-pay | Admitting: Cardiology

## 2021-04-01 NOTE — Assessment & Plan Note (Signed)
Smoking cessation instruction/counseling given:  counseled patient on the dangers of tobacco use, advised patient to stop smoking, and reviewed strategies to maximize success 

## 2021-04-01 NOTE — Assessment & Plan Note (Signed)
Follow-up CT scan shows stable thoracic aortic stent with no residual aneurysm and no leak.  Per vascular surgery, okay to be on monotherapy with either aspirin or Plavix.  We will simply continue Plavix and discontinue aspirin.  -> Okay to hold 5-7 days preop for surgeries or procedures.  (If possible would prefer to use aspirin 81 mg while holding Plavix)

## 2021-04-01 NOTE — Assessment & Plan Note (Signed)
Hypertensive today.  Increase losartan to 100 mg daily.  Check chemistry panel with lipids in January.  Also reassess blood pressure.  If still elevated, would ask him to CVRR to also adjust medications.

## 2021-04-01 NOTE — Assessment & Plan Note (Signed)
Clearly not at goal.  We will recheck lipids in January 2023.  Apparently he has tried atorvastatin, rosuvastatin, simvastatin all with side effects and Livalo was not affordable.  Most recent labs were from March, stenotic control.  We will check again in January but referred to our clinical pharmacist to initiate PCSK9 inhibitor.

## 2021-04-01 NOTE — Assessment & Plan Note (Signed)
EF now back up to baseline.  Preserved EF with no CHF symptoms.  Dyspnea seems more related to COPD than anginal or CHF related. On losartan 50 mg-we will increase to 100 mg due to elevated blood pressures.

## 2021-04-01 NOTE — Assessment & Plan Note (Signed)
He has tried simvastatin, atorvastatin and rosuvastatin all of myalgias weakness.  Tried Livalo, stopped taking Mobic as a financial issues then because of symptoms.  Plan is to recheck lipids and refer to CVRR lipid clinic.  This was a plan during last visit, but he did not follow through.

## 2021-04-01 NOTE — Assessment & Plan Note (Signed)
Extensive PCI to the RCA and LAD in the setting of inferior STEMI.  EF improved.  No recurrent angina.  No CHF.  Plan: Continue ARB.  Not on beta-blocker because of COPD which error tumorectomy beta-1 selective.  Statin intolerant-referring to CVRR lipid clinic.  Discontinue aspirin and continue Plavix monotherapy.  Okay to hold Plavix 5 to 7 days preop for surgery or procedures.

## 2021-04-11 ENCOUNTER — Ambulatory Visit: Payer: Medicare HMO | Admitting: Pharmacist Clinician (PhC)/ Clinical Pharmacy Specialist

## 2021-04-11 ENCOUNTER — Other Ambulatory Visit: Payer: Self-pay

## 2021-04-11 DIAGNOSIS — E785 Hyperlipidemia, unspecified: Secondary | ICD-10-CM

## 2021-04-11 DIAGNOSIS — I10 Essential (primary) hypertension: Secondary | ICD-10-CM

## 2021-04-11 LAB — COMPREHENSIVE METABOLIC PANEL
ALT: 11 IU/L (ref 0–44)
AST: 12 IU/L (ref 0–40)
Albumin/Globulin Ratio: 1.7 (ref 1.2–2.2)
Albumin: 4.1 g/dL (ref 3.7–4.7)
Alkaline Phosphatase: 170 IU/L — ABNORMAL HIGH (ref 44–121)
BUN/Creatinine Ratio: 14 (ref 10–24)
BUN: 20 mg/dL (ref 8–27)
Bilirubin Total: 0.9 mg/dL (ref 0.0–1.2)
CO2: 24 mmol/L (ref 20–29)
Calcium: 9.5 mg/dL (ref 8.6–10.2)
Chloride: 102 mmol/L (ref 96–106)
Creatinine, Ser: 1.38 mg/dL — ABNORMAL HIGH (ref 0.76–1.27)
Globulin, Total: 2.4 g/dL (ref 1.5–4.5)
Glucose: 88 mg/dL (ref 70–99)
Potassium: 4.4 mmol/L (ref 3.5–5.2)
Sodium: 138 mmol/L (ref 134–144)
Total Protein: 6.5 g/dL (ref 6.0–8.5)
eGFR: 40 mL/min/{1.73_m2} — ABNORMAL LOW (ref >59)

## 2021-04-11 LAB — LIPID PANEL
Chol/HDL Ratio: 4.4 ratio (ref 0.0–5.0)
Cholesterol, Total: 204 mg/dL — ABNORMAL HIGH (ref 100–199)
HDL: 46 mg/dL (ref >39)
LDL Chol Calc (NIH): 132 mg/dL — ABNORMAL HIGH (ref 0–99)
Triglycerides: 146 mg/dL (ref 0–149)
VLDL Cholesterol Cal: 26 mg/dL (ref 5–40)

## 2021-04-11 NOTE — Progress Notes (Signed)
04/13/2021 Patrick Adams 11/01/1945 [id number]   HPI:  Patrick Adams is a 76 y.o. male patient of Dr harding, with a PMH below who presents today for hypertension and lipid clinic evaluation.  We have seen him twice in the past in [facility], in 2109 then a year later.  He was not interested in PCSK9 therapy at either of those visit, and after another failed trial at statin use, we tried getting him into the CLEAR trial, but were unable.  He saw Dr. Ellyn Hack last month and feels he is now ready to get his cholesterol levels to goal.    He also notes home blood pressure readings have been doing well since Dr. Ellyn Hack increased losartan to 100 mg daily.    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, PCI to LAD and staged PCI to RCA  HLD LDL at 141, goal < 55, no meds  Ischemic cardiomyopathy EF back to baseline, no CHF symptoms  hemochromatosis   Thoracic aortic aneurysm      Blood Pressure Goal:  130/80 Cholesterol Goal:  LDL < 55  Current Medications: losartan 100 mg  No cholesterol meds - statin intolerant  Family Hx: father had hyperlipidemia and MI, mother with stroke, sister and daughter without issues  Social Hx: continues to smoke 1 ppd, not interested in quitting  Diet:  all meals eating out; mostly sit down restaurnat; tries to get veggies most days; does eat occasioanl hot dogs Today - sausage bus  Exercise:  activities of daily living  Intolerances: atorvastatin, rosuvastatin, simvastatin - all caused myalgias   Labs:  3/22:  TC 214, TG 134, HDL 49, LDL 141   Wt Readings from Last 3 Encounters:  04/11/21 170 lb 6.4 oz (77.3 kg)  03/18/21 171 lb 3.2 oz (77.7 kg)  10/08/20 160 lb (72.6 kg)   BP Readings from Last 3 Encounters:  04/11/21 130/76  03/18/21 (!) 152/72  10/09/20 (!) 143/85   Pulse Readings from Last 3 Encounters:  04/11/21 67  03/18/21 73  10/09/20 (!) 57    Current Outpatient Medications  Medication Sig Dispense Refill   acetaminophen  (TYLENOL) 500 MG tablet Take 1,000 mg by mouth every 6 (six) hours as needed for moderate pain or headache.     cholecalciferol (VITAMIN D3) 25 MCG (1000 UT) tablet Take 1,000 Units by mouth daily.     clopidogrel (PLAVIX) 75 MG tablet TAKE 1 TABLET BY MOUTH EVERY DAY 90 tablet 2   losartan (COZAAR) 100 MG tablet Take 1 tablet (100 mg total) by mouth daily. 90 tablet 3   nitroGLYCERIN (NITROSTAT) 0.4 MG SL tablet Place 1 tablet (0.4 mg total) under the tongue every 5 (five) minutes as needed for chest pain. 100 tablet 3   polyethylene glycol (MIRALAX / GLYCOLAX) 17 g packet Take 17 g by mouth daily as needed (Constipation). 14 each 0   sildenafil (VIAGRA) 100 MG tablet Take 1 tablet (100 mg total) by mouth daily as needed for erectile dysfunction. 10 tablet 6   No current facility-administered medications for this visit.    Allergies  Allergen Reactions   Brilinta [Ticagrelor] Shortness Of Breath     Patient was taking 60 mg  Twice a day - changed to Clopidogrel 75 mg  03/02/19   Ciprofloxacin Anaphylaxis   Statins Other (See Comments)    Joint pain.Myalgias -- HAS TRIED LIPITOR, ZOCOR & CRESTOR    Past Medical History:  Diagnosis Date   CAD S/P  PCI 01/2018   a) 01/31/2018: p-mLAD 60-100% (DES PCI with 2.75 mm x 38 mm Synergy DES - 4.0-3.6.3.0 mm tapered post-dilation); b) p-mRCA 55%-85%-40% - Synergy DES 3.5 mm x 32 mm -> 4.0 mm; & RPAV 90% --> Synergy DES 2.5 mm x 12 mm -> 3.0 mm;; c) stents patent on re-look Cath 03/19/2018   Diverticulitis    Erectile dysfunction    Hemochromatosis    Hyperlipidemia    Hypertension    Ischemic cardiomyopathy 01/31/2018   Initial post STEMI EF by echo 40 to 45% --> by cath 1 month later LV gram showed EF 55 to 60%. -- most Recent Echo: EF 45-50%. Mid-Apical Anterior/Anterospetal HKc.a LAD infarct.   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 Memory Dance, annual scans -> until summer/fall 2021 -> noted progression to 6.2 mm aneurysm --> [facility]/25/[facility]- Dr. 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 rate 17, height 6' (1.829 m), weight 170 lb 6.4 oz (77.3 kg), SpO2 98 %.  Essential hypertension Blood pressure now controlled since increasing losartan to 100 mg daily.  He should continue with monitoring home readings 2-3 times per week and reach out to the office with any concerns.   Hyperlipidemia with target LDL less than 70 Patient with ASCVD and LDL goal of < 55, not able to tolerate multiple statin drugs.  Reviewed options for lowering LDL cholesterol, including ezetimibe, PCSK-9 inhibitors, bempedoic acid and inclisiran.  Discussed mechanisms of action, dosing, side effects and potential decreases in LDL cholesterol.  Also reviewed cost information and potential options for patient assistance.  Answered all patient questions.  Based on this information, patient would prefer to start PCSK9 inhibitor.  Based on his insurance information Praluent is the drug of choice.  Will have him start with 150 mg q14 days and repeat cholesterol labs after 4-6 doses.     Tommy Medal PharmD CPP [facility] [address] [place], [place]  [address] [phone]

## 2021-04-11 NOTE — Patient Instructions (Addendum)
Your Results:             Your most recent labs Goal  Total Cholesterol 214 < 200  Triglycerides 134 < 150  HDL (happy/good cholesterol) 49 > 40  LDL (lousy/bad cholesterol 141 < 55   Medication changes:  Try the Praluent sample.  When I get the results of your blood work, I will reach out to you and we can determine if you want to use Praluent regularly  Lab orders:  We will ask you to repeat cholesterol labs after 4-6 doses (about 2-3 months)  Patient Assistance:  The [employer] foundation offers assistance to help pay for medication copays.  They will cover copays for all cholesterol lowering meds, including statins, fibrates, omega-3 oils, ezetimibe, Repatha, Praluent, Nexletol, Nexlizet.  The cards are usually good for $2,500 or 12 months, whichever comes first. Go to healthwellfoundation.org Click on Apply Now Answer questions as to whom is applying (patient or representative) Your disease fund will be hypercholesterolemia - Medicare access They will ask questions about finances and which medications you are taking for cholesterol When you submit, the approval is usually within minutes.  You will need to print the card information from the site You will need to show this information to your pharmacy, they will bill your Medicare Part D plan first -then bill [employer] Well --for the copay.   You can also call them at [phone], although the hold times can be quite long.   Thank you for choosing [facility]

## 2021-04-13 ENCOUNTER — Telehealth: Payer: Self-pay | Admitting: Pharmacist Clinician (PhC)/ Clinical Pharmacy Specialist

## 2021-04-13 ENCOUNTER — Encounter: Payer: Self-pay | Admitting: Pharmacist Clinician (PhC)/ Clinical Pharmacy Specialist

## 2021-04-13 DIAGNOSIS — E785 Hyperlipidemia, unspecified: Secondary | ICD-10-CM

## 2021-04-13 NOTE — Assessment & Plan Note (Signed)
Blood pressure now controlled since increasing losartan to 100 mg daily.  He should continue with monitoring home readings 2-3 times per week and reach out to the office with any concerns.

## 2021-04-13 NOTE — Assessment & Plan Note (Signed)
Patient with ASCVD and LDL goal of < 55, not able to tolerate multiple statin drugs.  Reviewed options for lowering LDL cholesterol, including ezetimibe, PCSK-9 inhibitors, bempedoic acid and inclisiran.  Discussed mechanisms of action, dosing, side effects and potential decreases in LDL cholesterol.  Also reviewed cost information and potential options for patient assistance.  Answered all patient questions.  Based on this information, patient would prefer to start PCSK9 inhibitor.  Based on his insurance information Praluent is the drug of choice.  Will have him start with 150 mg q14 days and repeat cholesterol labs after 4-6 doses.

## 2021-04-13 NOTE — Telephone Encounter (Signed)
[place] - please do PA for Praluent with repeat labs after 4-6 doses.

## 2021-04-15 MED ORDER — PRALUENT 150 MG/ML ~~LOC~~ SOAJ: 150.0000 mg | SUBCUTANEOUS | 11 refills | Status: DC

## 2021-04-15 NOTE — Telephone Encounter (Signed)
Praluent 150mg  sent to planand I will notify pt of determination when results come in [place] (Key: [id number]) Lipid/hepatic panel ordered and released [place] is as follows: [employer] ID [id number]   PATIENT Patrick Adams   STATUS  Active   START DATE 03/12/2021   END DATE 03/11/2022   ASSISTANCE TYPE Co-pay   PAID $0.00   PENDING $0.00   BALANCE $2500.00 Pharmacy Card CARD NO. [id number]   CARD STATUS Active   BIN [id number]   PCN [id number]   PC GROUP [id number]   HELP DESK [phone]   PROVIDER PDMI   PROCESSOR PDMI

## 2021-04-15 NOTE — Telephone Encounter (Signed)
Called and spoke w/pt to let them know praluent approved, rx sent, healthwell grant info given, advised pt to complete fasting labs post 4th dose and they voiced understanding.

## 2021-04-15 NOTE — Addendum Note (Signed)
Addended by: Allean Found on: 04/15/2021 09:37 AM   Modules accepted: Orders

## 2021-08-09 ENCOUNTER — Other Ambulatory Visit: Payer: Self-pay | Admitting: Cardiology

## 2021-11-07 ENCOUNTER — Telehealth: Payer: Self-pay | Admitting: Cardiology

## 2021-11-07 NOTE — Telephone Encounter (Signed)
Called patient . Patient stated  he stopped Aspirin 81 mg in 03/18/21 per Dr Ellyn Hack order. He still continue taking Plavix 75 mg.  Patient states he has been having some issues he restarted taking Aspirin 81 mg on his own. He states he feels better taking it.   Patient wanted know if he should continue taking the Plavix since restarted the Aspirin.  RN informed patient to continue until further notice. Aware will defer to Dr Ellyn Hack. Aware he is out of the office. Continue taking Losartan 100 mg   Patient verbalized understanding

## 2021-11-07 NOTE — Telephone Encounter (Signed)
Pt c/o medication issue:  1. Name of Medication: Aspirin 81 mg Clopidogrel 75 mg and Losartan 100 mg  2. How are you currently taking this medication (dosage and times per day)? 1 tablet per day for each one of them  3. Are you having a reaction (difficulty breathing--STAT)?   4. What is your medication issue? Patient wants to know if he needs to take Clopidogrel and Losartan since he started back on the Aspirin since 10-29-21

## 2021-11-21 NOTE — Telephone Encounter (Signed)
All depends on what the code issues that he was having are.  I think he meets feeling fine and doing well on aspirin plus Plavix is reasonable.  My intention was simply to just have him on monotherapy. Since he has extensive aortic and coronary disease, I was thinking that Plavix was more beneficial than aspirin.  If he is not having any issues, I would stay on both.  Glenetta Hew, MD

## 2021-11-28 NOTE — Telephone Encounter (Signed)
Left a detail message of Dr Gweneth Fritter continue taking both = Plavix, aspirin, and losartan Any question may call back

## 2021-12-31 ENCOUNTER — Emergency Department (HOSPITAL_BASED_OUTPATIENT_CLINIC_OR_DEPARTMENT_OTHER): Payer: Medicare HMO

## 2021-12-31 ENCOUNTER — Emergency Department (HOSPITAL_BASED_OUTPATIENT_CLINIC_OR_DEPARTMENT_OTHER)
Admission: EM | Admit: 2021-12-31 | Discharge: 2021-12-31 | Disposition: A | Discharge status: Home / Self Care | Payer: Medicare HMO | Attending: Emergency Medicine | Admitting: Emergency Medicine

## 2021-12-31 ENCOUNTER — Other Ambulatory Visit: Payer: Self-pay

## 2021-12-31 ENCOUNTER — Encounter (HOSPITAL_BASED_OUTPATIENT_CLINIC_OR_DEPARTMENT_OTHER): Payer: Self-pay

## 2021-12-31 DIAGNOSIS — Z7902 Long term (current) use of antithrombotics/antiplatelets: Secondary | ICD-10-CM | POA: Diagnosis not present

## 2021-12-31 DIAGNOSIS — I716 Thoracoabdominal aortic aneurysm, without rupture, unspecified: Secondary | ICD-10-CM | POA: Diagnosis not present

## 2021-12-31 DIAGNOSIS — R0789 Other chest pain: Secondary | ICD-10-CM | POA: Insufficient documentation

## 2021-12-31 DIAGNOSIS — R109 Unspecified abdominal pain: Secondary | ICD-10-CM | POA: Diagnosis not present

## 2021-12-31 DIAGNOSIS — R630 Anorexia: Secondary | ICD-10-CM | POA: Insufficient documentation

## 2021-12-31 LAB — URINALYSIS, ROUTINE W REFLEX MICROSCOPIC
Bilirubin Urine: NEGATIVE
Glucose, UA: NEGATIVE mg/dL
Hgb urine dipstick: NEGATIVE
Ketones, ur: NEGATIVE mg/dL
Leukocytes,Ua: NEGATIVE
Nitrite: NEGATIVE
Protein, ur: NEGATIVE mg/dL
Specific Gravity, Urine: >1.046 — ABNORMAL HIGH (ref 1.005–1.030)
pH: 6 (ref 5.0–8.0)

## 2021-12-31 LAB — COMPREHENSIVE METABOLIC PANEL
ALT: 10 U/L (ref 0–44)
AST: 11 U/L — ABNORMAL LOW (ref 15–41)
Albumin: 4.4 g/dL (ref 3.5–5.0)
Alkaline Phosphatase: 109 U/L (ref 38–126)
Anion gap: 12 (ref 5–15)
BUN: 16 mg/dL (ref 8–23)
CO2: 24 mmol/L (ref 22–32)
Calcium: 10.1 mg/dL (ref 8.9–10.3)
Chloride: 100 mmol/L (ref 98–111)
Creatinine, Ser: 1.28 mg/dL — ABNORMAL HIGH (ref 0.61–1.24)
GFR, Estimated: 59 mL/min — ABNORMAL LOW (ref >60)
Glucose, Bld: 97 mg/dL (ref 70–99)
Potassium: 4.3 mmol/L (ref 3.5–5.1)
Sodium: 136 mmol/L (ref 135–145)
Total Bilirubin: 1 mg/dL (ref 0.3–1.2)
Total Protein: 7.1 g/dL (ref 6.5–8.1)

## 2021-12-31 LAB — CBC WITH DIFFERENTIAL/PLATELET
Abs Immature Granulocytes: 0.02 10*3/uL (ref 0.00–0.07)
Basophils Absolute: 0 10*3/uL (ref 0.0–0.1)
Basophils Relative: 0 %
Eosinophils Absolute: 0 10*3/uL (ref 0.0–0.5)
Eosinophils Relative: 1 %
HCT: 44.2 % (ref 39.0–52.0)
Hemoglobin: 15 g/dL (ref 13.0–17.0)
Immature Granulocytes: 0 %
Lymphocytes Relative: 14 %
Lymphs Abs: 0.8 10*3/uL (ref 0.7–4.0)
MCH: 31.1 pg (ref 26.0–34.0)
MCHC: 33.9 g/dL (ref 30.0–36.0)
MCV: 91.5 fL (ref 80.0–100.0)
Monocytes Absolute: 0.3 10*3/uL (ref 0.1–1.0)
Monocytes Relative: 6 %
Neutro Abs: 4.4 10*3/uL (ref 1.7–7.7)
Neutrophils Relative %: 79 %
Platelets: 225 10*3/uL (ref 150–400)
RBC: 4.83 MIL/uL (ref 4.22–5.81)
RDW: 12.3 % (ref 11.5–15.5)
WBC: 5.6 10*3/uL (ref 4.0–10.5)
nRBC: 0 % (ref 0.0–0.2)

## 2021-12-31 LAB — LIPASE, BLOOD: Lipase: 13 U/L (ref 11–51)

## 2021-12-31 LAB — TROPONIN I (HIGH SENSITIVITY)
Troponin I (High Sensitivity): 3 ng/L (ref <18)
Troponin I (High Sensitivity): 3 ng/L (ref <18)

## 2021-12-31 MED ORDER — SODIUM CHLORIDE 0.9 % IV BOLUS
500.0000 mL | Freq: Once | INTRAVENOUS | Status: AC
  Administered 2021-12-31: 500 mL via INTRAVENOUS

## 2021-12-31 MED ORDER — FENTANYL CITRATE PF 50 MCG/ML IJ SOSY
50.0000 ug | PREFILLED_SYRINGE | Freq: Once | INTRAMUSCULAR | Status: AC
  Administered 2021-12-31: 50 ug via INTRAVENOUS
  Filled 2021-12-31: qty 1

## 2021-12-31 MED ORDER — IOHEXOL 350 MG/ML SOLN
100.0000 mL | Freq: Once | INTRAVENOUS | Status: AC | PRN
  Administered 2021-12-31: 100 mL via INTRAVENOUS

## 2021-12-31 NOTE — ED Triage Notes (Addendum)
Pt states he thinks he has kidney pain. Pt states he has been going to a pain clinic, thinking it was nerve pain- pt was taking tramadol and extra strength tylenol. Pt c/o right flank pain, wrapping around. This has been occurring for "months". Pt describes constant pain. Pt states pain with urination. Pt states he is only voiding small amounts.

## 2021-12-31 NOTE — ED Notes (Addendum)
Left VM for Dr Vinnie Level at [facility] [phone] to call Dr. Tamera Punt.-ABB/AM (NS) 16:58 Left mess w/Kristin [phone] for [facility] Phy on Call (Dr. Ronita Hipps) ABB (NS)17:27

## 2021-12-31 NOTE — ED Notes (Signed)
Pt attempting UA

## 2021-12-31 NOTE — Discharge Instructions (Addendum)
You were seen in the emergency department for continued right-sided chest and flank pain along with new weight loss and difficulty urinating.  Your lab work did not show an obvious cause of your symptoms.  You should follow-up with your pain management doctor.  Your aortic images showed a little bit of a change from your prior studies.  I discussed this with the vascular surgeons at [facility].  They are going to arrange for follow-up with you.  If you do not hear from the office in the next day or 2, contact your vascular surgeon.

## 2021-12-31 NOTE — ED Provider Notes (Signed)
[place] EMERGENCY DEPT Provider Note   CSN: [id number] Arrival date & time: 12/31/21  1215     History  Chief Complaint  Patient presents with   Flank Pain    Patrick Adams is a 76 y.o. male.  He has had ongoing right lower chest and flank pain for over a year.  He said he had a lung resection for lung cancer over 3 years ago.  He has been seeing a pain specialist for his pain.  Had been stable on Tylenol and tramadol although recently pain has not been well controlled.  Just saw them recently and they switched him over to oxycodone which he has not started yet.  He is also noticed some weight loss and some difficulty passing urine and is concerned that there may be another cause of his pain other than neuropathic pain.  He is concerned he may be having kidney or liver problems.  No fevers or chills.  He has had poor appetite.  No vomiting or diarrhea.  No hematuria. History of vascular stents.   The history is provided by the patient.  Flank Pain This is a chronic problem. The problem occurs constantly. The problem has not changed since onset.Associated symptoms include chest pain and abdominal pain. Pertinent negatives include no headaches and no shortness of breath. The symptoms are aggravated by bending and twisting. The symptoms are relieved by medications. He has tried acetaminophen for the symptoms. The treatment provided no relief.       Home Medications Prior to Admission medications   Medication Sig Start Date End Date Taking? Authorizing Provider  acetaminophen (TYLENOL) 500 MG tablet Take 1,000 mg by mouth every 6 (six) hours as needed for moderate pain or headache.    [provider]  Alirocumab (PRALUENT) 150 MG/ML SOAJ Inject 150 mg into the skin every 14 (fourteen) days. 04/15/21   Leonie Man, MD  cholecalciferol (VITAMIN D3) 25 MCG (1000 UT) tablet Take 1,000 Units by mouth daily.    [provider]  clopidogrel (PLAVIX) 75 MG  tablet TAKE 1 TABLET BY MOUTH EVERY DAY 08/09/21   Leonie Man, MD  losartan (COZAAR) 100 MG tablet Take 1 tablet (100 mg total) by mouth daily. 03/18/21 06/16/21  Leonie Man, MD  nitroGLYCERIN (NITROSTAT) 0.4 MG SL tablet Place 1 tablet (0.4 mg total) under the tongue every 5 (five) minutes as needed for chest pain. 07/20/19 09/10/22  Leonie Man, MD  polyethylene glycol (MIRALAX / GLYCOLAX) 17 g packet Take 17 g by mouth daily as needed (Constipation). 05/24/19   Shelda Pal, DO  sildenafil (VIAGRA) 100 MG tablet Take 1 tablet (100 mg total) by mouth daily as needed for erectile dysfunction. 09/10/20   Leonie Man, MD      Allergies    Brilinta [ticagrelor], Ciprofloxacin, and Statins    Review of Systems   Review of Systems  Constitutional:  Positive for unexpected weight change. Negative for fever.  Respiratory:  Negative for shortness of breath.   Cardiovascular:  Positive for chest pain.  Gastrointestinal:  Positive for abdominal pain. Negative for nausea and vomiting.  Genitourinary:  Positive for difficulty urinating and flank pain.  Musculoskeletal:  Positive for back pain.  Neurological:  Negative for headaches.    Physical Exam Updated Vital Signs BP (!) 162/91 (BP Location: Right Arm)   Pulse 85   Temp 98.1 F (36.7 C) (Oral)   Resp 16   Ht 6' (1.829 m)  Wt 72.6 kg   SpO2 100%   BMI 21.70 kg/m  Physical Exam Vitals and nursing note reviewed.  Constitutional:      General: He is not in acute distress.    Appearance: Normal appearance. He is well-developed.  HENT:     Head: Normocephalic and atraumatic.  Eyes:     Conjunctiva/sclera: Conjunctivae normal.  Cardiovascular:     Rate and Rhythm: Normal rate and regular rhythm.     Heart sounds: No murmur heard. Pulmonary:     Effort: Pulmonary effort is normal. No respiratory distress.     Breath sounds: Normal breath sounds.     Comments: Well-healed thoracotomy scar right  flank Abdominal:     Palpations: Abdomen is soft.     Tenderness: There is no abdominal tenderness. There is no guarding or rebound.  Musculoskeletal:        General: No deformity. Normal range of motion.     Cervical back: Neck supple.     Right lower leg: No edema.     Left lower leg: No edema.  Skin:    General: Skin is warm and dry.     Capillary Refill: Capillary refill takes less than 2 seconds.     Coloration: Skin is not jaundiced.  Neurological:     General: No focal deficit present.     Mental Status: He is alert.     Sensory: No sensory deficit.     Motor: No weakness.     ED Results / Procedures / Treatments   Labs (all labs ordered are listed, but only abnormal results are displayed) Labs Reviewed  URINALYSIS, ROUTINE W REFLEX MICROSCOPIC - Abnormal; Notable for the following components:      Result Value   Color, Urine COLORLESS (*)    Specific Gravity, Urine >1.046 (*)    All other components within normal limits  COMPREHENSIVE METABOLIC PANEL - Abnormal; Notable for the following components:   Creatinine, Ser 1.28 (*)    AST 11 (*)    GFR, Estimated 59 (*)    All other components within normal limits  LIPASE, BLOOD  CBC WITH DIFFERENTIAL/PLATELET  TROPONIN I (HIGH SENSITIVITY)  TROPONIN I (HIGH SENSITIVITY)    EKG EKG Interpretation  Date/Time:  Tuesday December 31 2021 13:48:05 EDT Ventricular Rate:  59 PR Interval:  194 QRS Duration: 81 QT Interval:  407 QTC Calculation: 404 R Axis:   39 Text Interpretation: Sinus rhythm Ventricular premature complex No significant change since prior 7/22 Confirmed by Aletta Edouard [phone]) on 12/31/2021 1:55:49 PM  Radiology No results found.  Procedures Procedures    Medications Ordered in ED Medications  sodium chloride 0.9 % bolus 500 mL (500 mLs Intravenous New Bag/Given 12/31/21 1405)  fentaNYL (SUBLIMAZE) injection 50 mcg (50 mcg Intravenous Given 12/31/21 1510)  iohexol (OMNIPAQUE) 350 MG/ML  injection 100 mL (100 mLs Intravenous Contrast Given 12/31/21 1530)    ED Course/ Medical Decision Making/ A&P                           Medical Decision Making Amount and/or Complexity of Data Reviewed Labs: ordered. Radiology: ordered.  Risk Prescription drug management.  This patient complains of right lower lateral chest right flank pain; this involves an extensive number of treatment Options and is a complaint that carries with it a high risk of complications and morbidity. The differential includes musculoskeletal pain, pneumonia, PE, vascular, hepatitis, biliary colic, neuropathic, malignancy  I  ordered, reviewed and interpreted labs, which included CBC normal, chemistries normal other than elevated creatinine stable from priors, urinalysis without signs of infection, troponins flat I ordered medication IV fluids IV pain medication and reviewed PMP when indicated. I ordered imaging studies which included chest x-ray and CT angio chest abdomen and pelvis and I independently    visualized and interpreted imaging which showed no acute findings on chest x-ray.  Angio pending at time of signout Previous records obtained and reviewed in epic including pain clinic visits Cardiac monitoring reviewed, normal sinus rhythm Social determinants considered, no significant barriers Critical Interventions: None  After the interventions stated above, I reevaluated the patient and found patient to be hemodynamically stable Admission and further testing considered, patient is pending his CAT scan of his chest abdomen and pelvis.  His care is signed out to Dr. Tamera Punt.  Plan is to reassess after results of CT and disposition per findings.          Final Clinical Impression(s) / ED Diagnoses Final diagnoses:  Right-sided chest wall pain    Rx / DC Orders ED Discharge Orders     None         Hayden Rasmussen, MD 12/31/21 1752

## 2021-12-31 NOTE — ED Notes (Signed)
Unable to obtain sample at this time.

## 2022-01-05 NOTE — ED Provider Notes (Signed)
I took over care from Dr. Melina Copa on this patient on his ED visit from October 3.  Patient presented with some right side chest wall pain which seems to be more of a chronic issue.  However he had a CTA of his chest abdomen pelvis which was pending.  There did appear to be some worsening changes with his aortic aneurysm status post repair with some small endoleak's.  I was able to push the images to [facility].  I discussed with the vasular surgeon on call, Dr. Sammuel Hines who reviewed the images and did not feel that there was an urgent need for intervention.  He will make a note for the vascular surgery clinic to call for a close follow-up appointment.   Patrick Johns, MD 01/05/22 1747

## 2022-03-06 ENCOUNTER — Other Ambulatory Visit: Payer: Self-pay | Admitting: Cardiology

## 2022-03-17 ENCOUNTER — Encounter: Payer: Self-pay | Admitting: Cardiology

## 2022-03-17 ENCOUNTER — Ambulatory Visit: Payer: Medicare HMO | Attending: Cardiology | Admitting: Cardiology

## 2022-03-17 ENCOUNTER — Ambulatory Visit: Payer: Medicare HMO | Admitting: Cardiology

## 2022-03-17 VITALS — BP 118/54 | HR 63 | Ht 71.0 in | Wt 159.0 lb

## 2022-03-17 DIAGNOSIS — I10 Essential (primary) hypertension: Secondary | ICD-10-CM

## 2022-03-17 DIAGNOSIS — I2109 ST elevation (STEMI) myocardial infarction involving other coronary artery of anterior wall: Secondary | ICD-10-CM

## 2022-03-17 DIAGNOSIS — E785 Hyperlipidemia, unspecified: Secondary | ICD-10-CM

## 2022-03-17 DIAGNOSIS — I25119 Atherosclerotic heart disease of native coronary artery with unspecified angina pectoris: Secondary | ICD-10-CM | POA: Diagnosis not present

## 2022-03-17 DIAGNOSIS — I255 Ischemic cardiomyopathy: Secondary | ICD-10-CM

## 2022-03-17 DIAGNOSIS — G720 Drug-induced myopathy: Secondary | ICD-10-CM

## 2022-03-17 DIAGNOSIS — T466X5A Adverse effect of antihyperlipidemic and antiarteriosclerotic drugs, initial encounter: Secondary | ICD-10-CM

## 2022-03-17 DIAGNOSIS — I7123 Aneurysm of the descending thoracic aorta, without rupture: Secondary | ICD-10-CM

## 2022-03-17 DIAGNOSIS — Z720 Tobacco use: Secondary | ICD-10-CM

## 2022-03-17 NOTE — Patient Instructions (Addendum)
Medication Instructions:  No changes   *If you need a refill on your cardiac medications before your next appointment, please call your pharmacy*   Lab Work- fasting Lipid cmp If you have labs (blood work) drawn today and your tests are completely normal, you will receive your results only by: [place] (if you have MyChart) OR A paper copy in the mail If you have any lab test that is abnormal or we need to change your treatment, we will call you to review the results.   Testing/Procedures:  Not needed  Follow-Up: At [facility], you and your health needs are our priority.  As part of our continuing mission to provide you with exceptional heart care, we have created designated Provider Care Teams.  These Care Teams include your primary Cardiologist (physician) and Advanced Practice Providers (APPs -  Physician Assistants and Nurse Practitioners) who all work together to provide you with the care you need, when you need it.     Your next appointment:   12 month(s)  The format for your next appointment:   In Person  Provider:   Glenetta Hew, MD

## 2022-03-17 NOTE — Progress Notes (Signed)
Primary Care Provider: [name], [place] Cardiologist: Glenetta Hew, MD Electrophysiologist: None  Clinic Note: Chief Complaint  Patient presents with   Follow-up    12 months.   Coronary Artery Disease    No angina.   Hyperlipidemia    Not interested in taking medications.   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  He will follow-up catheterization done a month and a half post MI showed widely patent stents with diffuse distal LAD disease.  EF not always back to "normal "based on most recent echo.  Is 45 to 50%.  Not unexpected given large anterior infarct.  Very difficult management as he has extensive P aortic and peripheral vascular disease as well. => Continues to smoke with no interest in stopping, and is reluctant to take any medications for lipids.      Ischemic cardiomyopathy (Chronic)    EF 45 to 50% by echo here, was 50 to 55% at [facility].  Not unexpectedly there is anterior wall hypokinesis based on anterior MI diffuse anterior disease. NYHA Class I-II Symptoms -> mostly because of baseline exertional dyspnea.  No PND, orthopnea, or edema.  Euvolemic on exam.  He is on losartan for afterload reduction which were reducing to 50 mg dose.       Relevant Orders   Lipid panel   Comprehensive metabolic panel   Coronary artery disease involving native coronary artery of native heart with angina pectoris ([place]) - Primary (Chronic)    Significant LAD and RCA disease with long stents in the mid RCA and LAD as well as RPDA, extensive distal LAD disease not femoral PCI.  Plan: Not on beta-blocker because of fatigue and bradycardia issues.  Is only on losartan 100 mg which we will decrease to 50 mg because of his orthostatic symptoms. Unfortunately, he  continues to smoke, and is essentially refusing to take any lipid medications even PCSK9 inhibitors. Remains on maintenance Plavix Okay to hold Plavix 5 to 7 days preop for surgeries or procedures.      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  He is on Plavix monotherapy. Unfortunately as noted, not willing to stop smoking nor willing to take medications for lipids.      Hyperlipidemia with target LDL less than 70 (Chronic)    He was given a prescription for Praluent back in January but I do not think he was ever filled.  Currently not on any therapy for lipids.  Not willing to take medications.  Will recheck lipids, but I think at this point he fully understands the risks of not taking antilipid medications with a combination of coronary, aortic and peripheral vascular disease.      Relevant Orders   Lipid panel   Comprehensive metabolic panel   Essential hypertension (Chronic)   Relevant Orders   Lipid panel   Comprehensive metabolic panel     Other   Tobacco abuse (Chronic)    Not willing to discuss smoking cessation.      Statin myopathy (Chronic)    Intolerant of multiple statins.  Unwilling to take other medications.  He was given prescription for Praluent which she had never filled.       ===================================  HPI:    Patrick Adams is a 76 y.o. male with a PMH for CAD & PAD as noted below who presents today for ANNUAL FOLLOW-UP at the request of Toborg, Cyndie Mull, MD.             CAD: Anterior STEMI January 31, 2018: PCI to LAD-& staged PCI to RCA &  (see below); apical LAD 70% - too small for PCI HLD -Statin Myalgia -- >.  Had not been taking Livalo or Zetia ICM - Resolved - EF back to 45-50%.  Mid to apical anterior wall anteroseptal wall moderate hypokinesis.  (Consistent with LAD infarct) AAA-open aortobiiliac repair in 2008 -> extended to TAAA Redo FEVAR followed  by TEVAR @ [facility] Sept 2020 & Feb 2021) 02/23/19: TAAA repair with 4 vessel CMD FEVAR (CA, SMA, RRA, LRA) - Dr. Sammuel Hines 05/09/19: TEVAR with Darylene Price 34 x 34 x 209 mm due to type 1a endoleak - Dr. Wilhelmenia Blase 05/12/19: TEVAR prox cuff extension with Gore 37 mm x 15 cm due to type III endoleak with Dr. Wilhelmenia Blase. Postop ileus 02/06/20 [facility]) -R Hypogastric Aneurysm Repair - Stent Graft   Additional PMH:  Hereditary Hemochromatosis Non-Small Cell Lung Cancer - RLL (lung nodule seen on CT scan for aneurysm evaluation) PET 06/19/2019: RLL lesion  -> Bx + NSCLC yetStg 1 B  - Lobectomy 08/12/2019 -- negative margins; No further Rx. --> 6 month f/u - stale  Continues to Smoke ~ 1PPD - no interest in quitting.   Patrick Adams was last seen on March 18, 2021.  He was doing very well from a car standpoint.  Noted poor balance that limits walking.  Walking.  Also has claudication but no exertional dyspnea or chest pain.  Abdominal discomfort from an inguinal hernia.  No PND orthopnea or edema.  No irregular heartbeats palpitations.  Not very active.  Was in good spirits.  Baseline exertional dyspnea.   He was seen by Tommy Medal, [place] ([place] Pharmacist) on April 11, 2021 for hyperlipidemia with statin myalgia.  LDL at that time was 132 with HDL 46 and TG of 146.  He initially was not interested in PCSK9 inhibitors.  Had another failed trial of statin use.  Unable to get him into the CLEAR Trial.  => At this time, was agreeable to start PCSK9-I. --> wrote Rx for Praluent (based on insurance information)   Recent Hospitalizations:  12/31/2021-ER visit for right-sided chest wall pain (postthoracotomy pain, and likely potential METs) -> Pending MRI [facility]   Reviewed  CV studies:    The following studies were reviewed today: (if available, images/films reviewed: From Epic Chart or Care Everywhere) None:  Interval History:   Patrick Adams is here for annual follow-up  stating that he is doing pretty well overall from a cardiac standpoint.  Biggest issues is that he has had this chronic right-sided chest wall pain.  Currently being treated by pain medicine he describes it as a burning aching throbbing cramping type pain that is constant.  Was started on Lyrica but had some dizziness associated with it.  He is now on a slightly lower dose.  During constipation, tramadol so he takes MiraLAX.  He had a PET scan done along with CT scan but has an MRI pending to look for rib metastases.  He says he has occasional palpitations off-and-on but they do not last very long just a few seconds.   He did tell me that  he never started taking Praluent despite originally saying that he would.  He indicates he does not want to try any medications to treat it.  Similarly, he is not interested in smoking cessation.  He is somewhat deconditioned, pretty much sedentary does not do much besides activities daily living.  He does note exertional dyspnea but has had longstanding exertional dyspnea that predated his MI.   No resting or exertional chest pain or pressure. No PND, orthopnea, or edema.   No syncope or near CP, just has dizziness off and on.  Usually when he stands up quickly.  Usually after he is bending over r sitting for long time.  CV Review of Symptoms (Summary): Cardiovascular ROS: positive for - dyspnea on exertion and positional dizziness, rare palpitations; right-sided chest wall pain not left-sided chest discomfort/angina.  He does have claudication when he walks longer distances.. negative for - edema, orthopnea, paroxysmal nocturnal dyspnea, rapid heart rate, or syncope or near syncope, TIA/amaurosis fugax.Marland Kitchen  REVIEWED OF SYSTEMS   Review of Systems  Constitutional:  Positive for malaise/fatigue (Deconditioned.  Not very active.) and weight loss.  HENT:  Negative for congestion and nosebleeds.   Respiratory:  Positive for cough and shortness of breath (At baseline).    Cardiovascular:  Negative for leg swelling.  Gastrointestinal:  Negative for abdominal pain, blood in stool and melena.  Genitourinary:  Negative for hematuria.  Musculoskeletal:  Positive for back pain and joint pain. Negative for myalgias.       R side Rib pain - from METS  Neurological:  Positive for dizziness (Occasional dizzy.  Usually stands up quickly.). Negative for focal weakness and weakness.  Psychiatric/Behavioral:  Negative for depression and memory loss. The patient is not nervous/anxious and does not have insomnia.    I have reviewed and (if needed) personally updated the patient's problem list, medications, allergies, past medical and surgical history, social and family history.   PAST MEDICAL HISTORY   Past Medical History:  Diagnosis Date   CAD S/P PCI 01/2018   a) 01/31/2018: p-mLAD 60-100% (DES PCI with 2.75 mm x 38 mm Synergy DES - 4.0-3.6.3.0 mm tapered post-dilation); b) p-mRCA 55%-85%-40% - Synergy DES 3.5 mm x 32 mm -> 4.0 mm; & RPAV 90% --> Synergy DES 2.5 mm x 12 mm -> 3.0 mm;; c) stents patent on re-look Cath 03/19/2018   Diverticulitis    Erectile dysfunction    Hemochromatosis    Hyperlipidemia    Hypertension    Ischemic cardiomyopathy 01/31/2018   Initial post STEMI EF by echo 40 to 45% --> by cath 1 month later LV gram showed EF 55 to 60%. -- most Recent Echo: EF 45-50%. Mid-Apical Anterior/Anterospetal HKc.a LAD infarct.   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 Memory Dance, annual scans -> until summer/fall 2021 -> noted progression  to 6.2 mm aneurysm --> 11/25/221 [facility]- Dr. Sammuel Hines): 4 V branched FEVAR (CA,SMA, R&L Renal) -> 05/09/19: Type 1a Endoleak  - TEVAR & f/u TEVAR cuff Extension (2/11)    PAST SURGICAL HISTORY   Past Surgical History:  Procedure Laterality Date   ABDOMINAL AORTIC ANEURYSM REPAIR  2008   Aortobiiliac repair ->  Dr. Memory Dance, annual scans   CARDIAC EVENT MONITOR  10/2018   Baseline sinus rhythm with 1 degree AVB.  PVCs noted.  Average heart rate 73 bpm.  3 critical: 1 serious and 8 stable events noted: Critical-4.0-second pauses x3, serious 1 run 6 beat PVCs (NSVT), 3 pauses of 3.2 to 3.5 seconds.  Pauses do have P waves that appear to be blocked on some occasions.  Both junctional escape and normal sinus beats after pauses.   COLONOSCOPY  2016   CORONARY STENT INTERVENTION N/A 02/03/2018   Procedure: CORONARY STENT INTERVENTION;  Surgeon: Belva Crome, MD;  Location: [facility] INVASIVE CV LAB;;   staged PCI p-mRCA (Synergy DES 3.5 mm x 32 mm--4.0 mm covering entire diseased segment from  proximal 55 through the 85 and 40% lesions in the mid vessel), RPAV (Synergy DES 2.5 mm x 12 mm--3.0 mm)   CORONARY/GRAFT ACUTE MI REVASCULARIZATION N/A 01/31/2018   Procedure: Coronary/Graft Acute MI Revascularization;  Surgeon: Leonie Man, MD;  Location: [place] CV LAB;; ANTERIOR-INFERIOR STEMI: pLAD 60% - mLAD 100% (PCI-Synergy DES 2.75 mm at 38 mm--4.1-3.6-3.0 mm tapered post dilation)   ENDOVASCULAR RIGHT INTERNAL ILIAC ANEURYSM REPAIR Right 02/06/2020   [facility]; Dr. Sammuel Hines - Stent Graft Placed   LEFT HEART CATH AND CORONARY ANGIOGRAPHY N/A 01/31/2018   Procedure: LEFT HEART CATH AND CORONARY ANGIOGRAPHY;  Surgeon: Leonie Man, MD;  Location: [facility] INVASIVE CV LAB;;; (anterior  inferior STEMI) -> pLAD 60% - mLAD 100% (DES PCI); p-m RCA 55%, mRCA 85%, mRCA 40% (diffuse segmental disease), RPAV 90% (ulcerated), OM1 65%.  EF 35-45%, moderately elevated LVEDP --acute combined systolic and diastolic heart failure, ischemic cardiomyopathy   LEFT HEART CATH AND CORONARY ANGIOGRAPHY N/A 02/03/2018   Procedure: LEFT HEART CATH AND CORONARY ANGIOGRAPHY;  Surgeon: Belva Crome, MD;  Location: [place] CV LAB;  Service: Cardiovascular;  Laterality: N/A;   LEFT HEART CATH AND CORONARY ANGIOGRAPHY N/A 03/19/2018   Procedure: LEFT HEART CATH AND CORONARY ANGIOGRAPHY;  Surgeon: Martinique, Peter M, MD;  Location: [place] CV LAB;  Service: Cardiovascular;  Patent stent in p-mLAD, patent p-m RCA stent and RPAV stent.  Distal LAD 70%, OM1 65%.  EF 55-60%.   TEVAR  2/8 & 05/12/2019   [facility]-Dr. Sammuel Hines: (for type I a endoleak of FEVAR from 01/2019)  05/02/2019: TEVAR - COOK Alpha 34 x 34 x 209 (RFA); 05/12/19 - prox cuff extension TEVAR (Gore 37 mm x 15 mm, LCFA).   THORACOABDOMINAL AORTIC ANEURYSM REPAIR  02/23/2019   [facility]-Dr. Sammuel Hines: admitted for lumbar drain insertion followed by aneurysmal repair 4V FEVAR (CA, SMA, RRA, LRA - Cook Branced Device - Bilateral Femoral Access & L Brachial Cutdown).   TRANSTHORACIC ECHOCARDIOGRAM  02/01/2018   (Insetting of anterior-inferior STEMI) --> moderate LVH.  EF 40 to 45%.  Anterior, anteroseptal and apical severe hypokinesis to akinesis.  Indeterminate LV filling pressures.  Aortic sclerosis, no stenosis.   TRANSTHORACIC ECHOCARDIOGRAM  01/17/2019   [facility]: 01/17/2019: EF 55%-normal wall motion..  Mild RV dilation.  Aortic sclerosis but no stenosis   TRANSTHORACIC ECHOCARDIOGRAM  07/2019   EF 45-50%.Moderate HK of mid-apical  anterior-anteroseptal wall (c/w LAD MI). Mild-Mod AoV sclerosis w/o Stenosis. .   Dx Cath Dec 2019: Patent stents.  Apical LAD very small caliber-diffuse 70%.  Central and PAD: 2008: Open Aorto-Bi-Iliac [facility] AAA [facility] 02/23/19: TAAA repair with 4 vessel CMD FEVAR (CA, SMA, RRA, LRA) with Dr. Sammuel Hines 05/09/19: TEVAR with Darylene Price 34 x 34 x 209 mm due to type 1a endoleak with Dr. Wilhelmenia Blase 05/12/19: TEVAR proximal cuff extension with Gore 37 mm x 15 cm due to type III endoleak with Dr. Wilhelmenia Blase. 02/06/20: Right Hypogastric Pseudoaneurysm S/p Repair with stent graft with Dr. Sammuel Hines    There is no immunization history on file for this patient.  MEDICATIONS/ALLERGIES   Current Meds  Medication Sig   acetaminophen (TYLENOL) 500 MG tablet Take 1,000 mg by mouth every 6 (six) hours as needed for moderate pain or headache.   cholecalciferol (VITAMIN D3) 25 MCG (1000 UT) tablet Take 1,000 Units by mouth daily.   clopidogrel (PLAVIX) 75 MG tablet TAKE 1 TABLET BY MOUTH EVERY DAY   losartan (COZAAR) 100 MG tablet TAKE 1 TABLET BY MOUTH EVERY DAY   nitroGLYCERIN (NITROSTAT) 0.4 MG SL tablet Place 1 tablet (0.4 mg total) under the tongue every 5 (five) minutes as needed for chest pain.   polyethylene glycol (MIRALAX / GLYCOLAX) 17 g packet Take 17 g by mouth daily as needed (Constipation).   pregabalin (LYRICA) 50 MG capsule Take 50 mg by mouth 3 (three) times daily.   sildenafil (VIAGRA) 100 MG tablet Take 1 tablet (100 mg total) by mouth daily as needed for erectile dysfunction.   traMADol (ULTRAM) 50 MG tablet Take 50 mg by mouth every 6 (six) hours as needed.    Allergies  Allergen Reactions   Brilinta [Ticagrelor] Shortness Of Breath     Patient was taking 60 mg  Twice a day - changed to Clopidogrel 75 mg  03/02/19   Ciprofloxacin Anaphylaxis   Statins Other (See Comments)    Joint pain.Myalgias -- HAS TRIED LIPITOR, ZOCOR & CRESTOR    SOCIAL HISTORY/FAMILY HISTORY   Reviewed in Epic:  Pertinent findings:  Social History   Tobacco Use   Smoking status: Every Day    Packs/day: 1.00    Types: Cigarettes   Smokeless tobacco: Never  Vaping Use   Vaping Use: Never used  Substance Use Topics   Alcohol  use: No   Drug use: No   Social History   Social History Narrative   Divorced, retired   Lives alone   1 daughter   Smoker   No drugs/EtOH    OBJCTIVE -PE, EKG, labs   Wt Readings from Last 3 Encounters:  03/17/22 159 lb (72.1 kg)  12/31/21 160 lb (72.6 kg)  04/11/21 170 lb 6.4 oz (77.3 kg)    Physical Exam: BP (!) 118/54 (BP Location: Left Arm, Patient Position: Sitting, Cuff Size: Normal)   Pulse 63   Ht _0  (1.803 m)   Wt 159 lb (72.1 kg)   BMI 22.18 kg/m  Physical Exam Vitals reviewed.  Constitutional:      General: He is not in acute distress.    Appearance: Normal appearance. He is obese. He is not ill-appearing or toxic-appearing.  HENT:     Head: Normocephalic and atraumatic.  Neck:     Vascular: Carotid bruit (Soft bilateral) present. No JVD.  Cardiovascular:     Rate and Rhythm: Normal rate and regular rhythm. No extrasystoles are present.  Chest Wall: PMI is not displaced.     Pulses: Intact distal pulses. Decreased pulses (Bilateral pedal pulses, diminished, palpable.).     Heart sounds: S1 normal and S2 normal. Heart sounds are distant. No murmur heard.    No friction rub. No gallop.  Pulmonary:     Effort: Pulmonary effort is normal.     Comments: Notably diminished breath sounds in the right lung field.  Mild diffuse crackles but no rales or rhonchi.  No wheezing. Musculoskeletal:        General: No swelling. Normal range of motion.     Cervical back: Normal range of motion and neck supple.  Skin:    General: Skin is warm and dry.  Neurological:     General: No focal deficit present.     Mental Status: He is alert and oriented to person, place, and time.     Gait: Gait abnormal.  Psychiatric:        Mood and Affect: Mood normal.        Behavior: Behavior normal.        Thought Content: Thought content normal.        Judgment: Judgment normal.     Adult ECG Report 12/31/2021:  Rate: 59 ;  Rhythm: sinus bradycardia and premature  ventricular contractions (PVC); normal axis, intervals and durations.  Narrative Interpretation: Stable  Recent Labs:  [place]  Component 03/18/22 03/04/22 01/21/22 05/20/21  Na 136 139 137 134 Low   Potassium 4.6 4.0 4.1 4.4  Cl 101 103 100 101  CO2 _0 AGAP _1 Low   Glucose 97 113 High  122 High  88  BUN 20 29 High  18 15  Creatinine 1.02 1.13 1.12 1.34 High   Ca 9.9 10.2 High  9.8 9.8  ALK PHOS 116 114 109 152 High   T Bili 0.80 0.52 0.61 0.62  Total Protein 6.7 6.8 6.6 6.6  Alb 4.3 4.1 4.1 3.8  GLOBULIN 2.4 2.7 2.5 2.8  ALT _2 AST _3 eGFR 77  68  69 56   WBC 6.7 8.3 4.5 Low  5.7  RBC 4.41 4.42 4.69 4.83  HGB 14.3 14.1 15.2 14.9  HCT 42.5 42.4 43.8 44.0  MCV 96 96 93 91  MCH 32.4 31.9 32.4 30.8  MCHC 33.6 33.3 34.7 33.9  Plt Ct 216 230 211 226     Component Value Date   CHOL 204 (H) 04/11/2021   HDL 46 04/11/2021   LDLCALC 132 (H) 04/11/2021   TRIG 146 04/11/2021   CHOLHDL 4.4 04/11/2021   ================================================== I spent a total of 21 minutes with the patient spent in direct patient consultation.  Additional time spent with chart review  / charting (studies, outside notes, etc): 14 min Total Time: 35 min  Current medicines are reviewed at length with the patient today.  (+/- concerns) n/a  Notice: This dictation was prepared with Dragon dictation along with smart phrase technology. Any transcriptional errors that result from this process are unintentional and may not be corrected upon review.  Studies Ordered:   Orders Placed This Encounter  Procedures   Lipid panel   Comprehensive metabolic panel   No orders of the defined types were placed in this encounter.   Patient Instructions / Medication Changes & Studies & Tests Ordered   Patient Instructions  Medication Instructions:  No changes   *If you need a  refill on your cardiac medications before your next appointment, please call  your pharmacy*   Lab Work- fasting Lipid cmp If you have labs (blood work) drawn today and your tests are completely normal, you will receive your results only by: [place] (if you have MyChart) OR A paper copy in the mail If you have any lab test that is abnormal or we need to change your treatment, we will call you to review the results.   Testing/Procedures:  Not needed  Follow-Up: At [facility], you and your health needs are our priority.  As part of our continuing mission to provide you with exceptional heart care, we have created designated Provider Care Teams.  These Care Teams include your primary Cardiologist (physician) and Advanced Practice Providers (APPs -  Physician Assistants and Nurse Practitioners) who all work together to provide you with the care you need, when you need it.     Your next appointment:   12 month(s)  The format for your next appointment:   In Person  Provider:   Glenetta Hew, MD      Leonie Man, MD, MS Glenetta Hew, M.D., M.S. Interventional Cardiologist  [place]  Pager # [phone] Phone # [phone] [address]. [place], [place] [address]   Thank you for choosing [facility]!!

## 2022-04-02 ENCOUNTER — Encounter: Payer: Self-pay | Admitting: Cardiology

## 2022-04-02 NOTE — Assessment & Plan Note (Signed)
Followed by [facility] vascular surgery.  He is on Plavix monotherapy. Unfortunately as noted, not willing to stop smoking nor willing to take medications for lipids.

## 2022-04-02 NOTE — Assessment & Plan Note (Signed)
Not willing to discuss smoking cessation.

## 2022-04-02 NOTE — Assessment & Plan Note (Signed)
EF 45 to 50% by echo here, was 50 to 55% at [facility].  Not unexpectedly there is anterior wall hypokinesis based on anterior MI diffuse anterior disease. NYHA Class I-II Symptoms -> mostly because of baseline exertional dyspnea.  No PND, orthopnea, or edema.  Euvolemic on exam.  He is on losartan for afterload reduction which were reducing to 50 mg dose.

## 2022-04-02 NOTE — Assessment & Plan Note (Signed)
Significant LAD and RCA disease with long stents in the mid RCA and LAD as well as RPDA, extensive distal LAD disease not femoral PCI.  Plan: Not on beta-blocker because of fatigue and bradycardia issues.  Is only on losartan 100 mg which we will decrease to 50 mg because of his orthostatic symptoms. Unfortunately, he continues to smoke, and is essentially refusing to take any lipid medications even PCSK9 inhibitors. Remains on maintenance Plavix Okay to hold Plavix 5 to 7 days preop for surgeries or procedures.

## 2022-04-02 NOTE — Assessment & Plan Note (Signed)
A full 4 years out from his MI.  He will follow-up catheterization done a month and a half post MI showed widely patent stents with diffuse distal LAD disease.  EF not always back to "normal "based on most recent echo.  Is 45 to 50%.  Not unexpected given large anterior infarct.  Very difficult management as he has extensive P aortic and peripheral vascular disease as well. => Continues to smoke with no interest in stopping, and is reluctant to take any medications for lipids.

## 2022-04-02 NOTE — Assessment & Plan Note (Signed)
He was given a prescription for Praluent back in January but I do not think he was ever filled.  Currently not on any therapy for lipids.  Not willing to take medications.  Will recheck lipids, but I think at this point he fully understands the risks of not taking antilipid medications with a combination of coronary, aortic and peripheral vascular disease.

## 2022-04-02 NOTE — Assessment & Plan Note (Signed)
Intolerant of multiple statins.  Unwilling to take other medications.  He was given prescription for Praluent which she had never filled.

## 2022-04-16 ENCOUNTER — Other Ambulatory Visit (HOSPITAL_COMMUNITY): Payer: Self-pay

## 2022-04-16 ENCOUNTER — Telehealth: Payer: Self-pay

## 2022-04-16 NOTE — Telephone Encounter (Signed)
Pharmacy Patient Advocate Encounter   Received notification from [facility] that prior authorization for Repatha 140mg /ml is required/requested.    PA submitted on 04/16/22 to (ins) [employer] via CoverMyMeds Key [id number] Status is pending

## 2022-04-16 NOTE — Telephone Encounter (Signed)
Pharmacy Patient Advocate Encounter  Prior Authorization for Repatha 140mg /ml has been approved.    key# [id number] Effective dates: 1.1.24 through 12.31.24  Please note that RX Praluent is no longer on the patients formulary.

## 2022-04-21 NOTE — Telephone Encounter (Signed)
Spoke with patient, he notes he quit taking Praluent shortly after starting last January.  Not interested in trying Repatha

## 2022-05-04 ENCOUNTER — Other Ambulatory Visit: Payer: Self-pay | Admitting: Cardiology

## 2022-06-12 ENCOUNTER — Inpatient Hospital Stay (HOSPITAL_BASED_OUTPATIENT_CLINIC_OR_DEPARTMENT_OTHER): Payer: Medicare HMO | Admitting: Hematology & Oncology

## 2022-06-12 ENCOUNTER — Inpatient Hospital Stay: Payer: Medicare HMO | Attending: Hematology & Oncology

## 2022-06-12 ENCOUNTER — Encounter: Payer: Self-pay | Admitting: Hematology & Oncology

## 2022-06-12 VITALS — BP 131/66 | HR 88 | Temp 97.7°F | Resp 18 | Wt 151.1 lb

## 2022-06-12 DIAGNOSIS — Z923 Personal history of irradiation: Secondary | ICD-10-CM | POA: Diagnosis not present

## 2022-06-12 DIAGNOSIS — C3431 Malignant neoplasm of lower lobe, right bronchus or lung: Secondary | ICD-10-CM | POA: Insufficient documentation

## 2022-06-12 DIAGNOSIS — Z902 Acquired absence of lung [part of]: Secondary | ICD-10-CM | POA: Diagnosis not present

## 2022-06-12 DIAGNOSIS — C7A090 Malignant carcinoid tumor of the bronchus and lung: Secondary | ICD-10-CM

## 2022-06-12 DIAGNOSIS — F17210 Nicotine dependence, cigarettes, uncomplicated: Secondary | ICD-10-CM | POA: Insufficient documentation

## 2022-06-12 DIAGNOSIS — R918 Other nonspecific abnormal finding of lung field: Secondary | ICD-10-CM | POA: Diagnosis not present

## 2022-06-12 DIAGNOSIS — C7951 Secondary malignant neoplasm of bone: Secondary | ICD-10-CM | POA: Insufficient documentation

## 2022-06-12 DIAGNOSIS — C3490 Malignant neoplasm of unspecified part of unspecified bronchus or lung: Secondary | ICD-10-CM | POA: Insufficient documentation

## 2022-06-12 DIAGNOSIS — C3491 Malignant neoplasm of unspecified part of right bronchus or lung: Secondary | ICD-10-CM

## 2022-06-12 HISTORY — DX: Malignant carcinoid tumor of the bronchus and lung: C7A.090

## 2022-06-12 HISTORY — DX: Secondary malignant neoplasm of bone: C79.51

## 2022-06-12 LAB — CBC WITH DIFFERENTIAL (CANCER CENTER ONLY)
Abs Immature Granulocytes: 0.02 10*3/uL (ref 0.00–0.07)
Basophils Absolute: 0 10*3/uL (ref 0.0–0.1)
Basophils Relative: 0 %
Eosinophils Absolute: 0.1 10*3/uL (ref 0.0–0.5)
Eosinophils Relative: 2 %
HCT: 41 % (ref 39.0–52.0)
Hemoglobin: 13.8 g/dL (ref 13.0–17.0)
Immature Granulocytes: 0 %
Lymphocytes Relative: 12 %
Lymphs Abs: 0.6 10*3/uL — ABNORMAL LOW (ref 0.7–4.0)
MCH: 31.2 pg (ref 26.0–34.0)
MCHC: 33.7 g/dL (ref 30.0–36.0)
MCV: 92.8 fL (ref 80.0–100.0)
Monocytes Absolute: 0.4 10*3/uL (ref 0.1–1.0)
Monocytes Relative: 8 %
Neutro Abs: 4 10*3/uL (ref 1.7–7.7)
Neutrophils Relative %: 78 %
Platelet Count: 180 10*3/uL (ref 150–400)
RBC: 4.42 MIL/uL (ref 4.22–5.81)
RDW: 13 % (ref 11.5–15.5)
WBC Count: 5.1 10*3/uL (ref 4.0–10.5)
nRBC: 0 % (ref 0.0–0.2)

## 2022-06-12 LAB — CMP (CANCER CENTER ONLY)
ALT: 6 U/L (ref 0–44)
AST: 8 U/L — ABNORMAL LOW (ref 15–41)
Albumin: 4 g/dL (ref 3.5–5.0)
Alkaline Phosphatase: 95 U/L (ref 38–126)
Anion gap: 9 (ref 5–15)
BUN: 20 mg/dL (ref 8–23)
CO2: 28 mmol/L (ref 22–32)
Calcium: 9.9 mg/dL (ref 8.9–10.3)
Chloride: 103 mmol/L (ref 98–111)
Creatinine: 1.29 mg/dL — ABNORMAL HIGH (ref 0.61–1.24)
GFR, Estimated: 58 mL/min — ABNORMAL LOW (ref >60)
Glucose, Bld: 136 mg/dL — ABNORMAL HIGH (ref 70–99)
Potassium: 3.8 mmol/L (ref 3.5–5.1)
Sodium: 140 mmol/L (ref 135–145)
Total Bilirubin: 0.7 mg/dL (ref 0.3–1.2)
Total Protein: 6.3 g/dL — ABNORMAL LOW (ref 6.5–8.1)

## 2022-06-12 LAB — LACTATE DEHYDROGENASE: LDH: 115 U/L (ref 98–192)

## 2022-06-12 LAB — PREALBUMIN: Prealbumin: 21 mg/dL (ref 18–38)

## 2022-06-12 LAB — CEA (IN HOUSE-CHCC): CEA (CHCC-In House): 4.05 ng/mL (ref 0.00–5.00)

## 2022-06-12 NOTE — Progress Notes (Signed)
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  HPI: Mr. Patrick Adams is a very nice 77 year old white male.  He apparently knows friends and my wife.  He is a Actor.  He served in [employer] for several years.  I thanked him for his service.  He has been getting all of his oncologic care over at [facility].  His diagnosis goes back to 2021.  He was found to have a mass in the right lung.  This in the lower lobe.  He underwent a lobectomy in 08/10/2019.  The pathology report [id number]) showed an adenocarcinoma.  Measured 3.2 cm.  Involve the visceral pleura.  4 lymph nodes were sampled and all were negative.  He was subsequently staged at a stage Ib (T2N0M0).  He unfortunately had no actionable mutations.  He did not want any adjuvant chemotherapy.  He has been followed.  He has been having pain over on his right side.  It was felt that this was neuropathic pain, may be from the lobectomy.  He ultimately underwent scans which did not show any obvious recurrent disease.  However, an MRI was subsequently done which did show that there was disease in his right 10th and 8th rib.  PET scan apparently did not show any of this.  He did have some radiation therapy.  He had 5 treatments.  He said this really we can him.  I think he had radiation that was completed in February.  A follow-up scan that I have which I think was in February showed increasing soft tissue adjacent to the right lower lobe suture line.  He had increase in subpleural nodule in the right upper lobe.  The there may have been some increase in adenopathy in the mediastinum.  Again, he was told that he could come to the [facility] for his cancer care.  He looks like he is in good shape.  Again, he says he does not want any  chemotherapy.  He has not had any kind of bisphosphonate therapy.  His appetite is okay.  He has had no nausea or vomiting.  He has had no headache.  He has had no rashes.  There is been no leg swelling.  He was a smoker.  I think he still smokes.  Currently, his performance status is ECOG 1.    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 -- most Recent Echo: EF 45-50%. Mid-Apical Anterior/Anterospetal HKc.a LAD infarct.   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 Memory Dance, annual scans -> until summer/fall 2021 -> noted progression to 6.2 mm aneurysm --> [facility]/25/221 [facility]- Dr. Sammuel Hines): 4 V branched FEVAR (CA,SMA, R&L Renal) -> 05/09/19: Type 1a Endoleak  - TEVAR & f/u TEVAR cuff Extension (2/11)  :   Past Surgical History:  Procedure  Laterality Date   ABDOMINAL AORTIC ANEURYSM REPAIR  2008   Aortobiiliac repair ->  Dr. Memory Dance, annual scans   CARDIAC EVENT MONITOR  10/2018   Baseline sinus rhythm with 1 degree AVB.  PVCs noted.  Average heart rate 73 bpm.  3 critical: 1 serious and 8 stable events noted: Critical-4.0-second pauses x3, serious 1 run 6 beat PVCs (NSVT), 3 pauses of 3.2 to 3.5 seconds.  Pauses do have P waves that appear to be blocked on some occasions.  Both junctional escape and normal sinus beats after pauses.   COLONOSCOPY  2016   CORONARY STENT INTERVENTION N/A 02/03/2018   Procedure: CORONARY STENT INTERVENTION;  Surgeon: Belva Crome, MD;  Location: [facility] IN[facility]ASIVE [facility] LAB;;   staged PCI p-mRCA (Synergy DES 3.5 mm x 32 mm--4.0 mm covering entire diseased segment from  proximal 55 through the 85 and 40% lesions in the mid vessel), RPAV (Synergy DES 2.5 mm x 12 mm--3.0 mm)   CORONARY/GRAFT ACUTE MI REVASCULARIZATION N/A 01/31/2018   Procedure: Coronary/Graft Acute MI Revascularization;  Surgeon: Leonie Man, MD;  Location: [place] CV LAB;; ANTERIOR-INFERIOR STEMI: pLAD 60% - mLAD 100% (PCI-Synergy DES 2.75 mm at 38 mm--4.1-3.6-3.0 mm tapered post dilation)   ENDOVASCULAR RIGHT INTERNAL ILIAC ANEURYSM REPAIR Right 02/06/2020   [facility]; Dr. Sammuel Hines - Stent Graft Placed   LEFT HEART CATH AND CORONARY ANGIOGRAPHY N/A 01/31/2018   Procedure: LEFT HEART CATH AND CORONARY ANGIOGRAPHY;  Surgeon: Leonie Man, MD;  Location: [facility] INVASIVE CV LAB;;; (anterior inferior STEMI) -> pLAD 60% - mLAD 100% (DES PCI); p-m RCA 55%, mRCA 85%, mRCA 40% (diffuse segmental disease), RPAV 90% (ulcerated), OM1 65%.  EF 35-45%, moderately elevated LVEDP --acute combined systolic and diastolic heart failure, ischemic cardiomyopathy   LEFT HEART CATH AND CORONARY ANGIOGRAPHY N/A 02/03/2018   Procedure: LEFT HEART CATH AND CORONARY ANGIOGRAPHY;  Surgeon: Belva Crome, MD;  Location: [place] CV LAB;  Service:  Cardiovascular;  Laterality: N/A;   LEFT HEART CATH AND CORONARY ANGIOGRAPHY N/A 03/19/2018   Procedure: LEFT HEART CATH AND CORONARY ANGIOGRAPHY;  Surgeon: Martinique, Yoan Sallade M, MD;  Location: [place] CV LAB;  Service: Cardiovascular;  Patent stent in p-mLAD, patent p-m RCA stent and RPAV stent.  Distal LAD 70%, OM1 65%.  EF 55-60%.   TEVAR  2/8 & 05/12/2019   [facility]-Dr. Sammuel Hines: (for type I a endoleak of FEVAR from 01/2019)  05/02/2019: TEVAR - COOK Alpha 34 x 34 x 209 (RFA); 05/12/19 - prox cuff extension TEVAR ([place]ore 37 mm x 15 mm, LCFA).   THORACOABDOMINAL AORTIC ANEURYSM REPAIR  02/23/2019   [facility]-Dr. Sammuel Hines: admitted for lumbar drain insertion  followed by aneurysmal repair 4V FEVAR (CA, SMA, RRA, LRA - Cook Branced Device - Bilateral Femoral Access & L Brachial Cutdown).   TRANSTHORACIC ECHOCARDIOGRAM  02/01/2018   (Insetting of anterior-inferior STEMI) --> moderate LVH.  EF 40 to 45%.  Anterior, anteroseptal and apical severe hypokinesis to akinesis.  Indeterminate LV filling pressures.  Aortic sclerosis, no stenosis.   TRANSTHORACIC ECHOCARDIOGRAM  01/17/2019   [facility]: 01/17/2019: EF 55%-normal wall motion..  Mild RV dilation.  Aortic sclerosis but no stenosis   TRANSTHORACIC ECHOCARDIOGRAM  07/2019   EF 45-50%.Moderate HK of mid-apical anterior-anteroseptal wall (c/w LAD MI). Mild-Mod AoV sclerosis w/o Stenosis. .  :   Current Outpatient Medications:    acetaminophen (TYLENOL) 500 MG tablet, Take 1,000 mg by mouth every 6 (six) hours as needed for moderate pain or headache., Disp: , Rfl:    cholecalciferol (VITAMIN D3) 25 MCG (1000 UT) tablet, Take 1,000 Units by mouth daily., Disp: , Rfl:    clopidogrel (PLAVIX) 75 MG tablet, TAKE 1 TABLET BY MOUTH EVERY DAY, Disp: 90 tablet, Rfl: 2   losartan (COZAAR) 100 MG tablet, TAKE 1 TABLET BY MOUTH EVERY DAY, Disp: 90 tablet, Rfl: 3   nitroGLYCERIN (NITROSTAT) 0.4 MG SL tablet, Place 1 tablet (0.4 mg total) under the tongue every 5 (five) minutes  as needed for chest pain., Disp: 100 tablet, Rfl: 3   polyethylene glycol (MIRALAX / GLYCOLAX) 17 g packet, Take 17 g by mouth daily as needed (Constipation)., Disp: 14 each, Rfl: 0   sildenafil (VIAGRA) 100 MG tablet, Take 1 tablet (100 mg total) by mouth daily as needed for erectile dysfunction., Disp: 10 tablet, Rfl: 6   pregabalin (LYRICA) 50 MG capsule, Take 50 mg by mouth 3 (three) times daily. (Patient not taking: Reported on 06/12/2022), Disp: , Rfl: :  :   Allergies  Allergen Reactions   Brilinta [Ticagrelor] Shortness Of Breath     Patient was taking 60 mg  Twice a day - changed to Clopidogrel 75 mg  03/02/19   Ciprofloxacin Anaphylaxis   Statins Other (See Comments)    Joint pain.Myalgias -- HAS TRIED LIPITOR, ZOCOR & CRESTOR  :   Family History  Problem Relation Age of Onset   Colonic polyp Mother    Heart disease Father    Liver disease Father   :   Social History   Socioeconomic History   Marital status: Single    Spouse name: Not on file   Number of children: Not on file   Years of education: Not on file   Highest education level: Not on file  Occupational History   Not on file  Tobacco Use   Smoking status: Every Day    Packs/day: 1    Types: Cigarettes   Smokeless tobacco: Never  Vaping Use   Vaping Use: Never used  Substance and Sexual Activity   Alcohol use: No   Drug use: No   Sexual activity: Not on file  Other Topics Concern   Not on file  Social History Narrative   Divorced, retired   Lives alone   1 daughter   Smoker   No drugs/EtOH   Social Determinants of Radio broadcast assistant Strain: Not on file  Food Insecurity: No Food Insecurity (06/12/2022)   Hunger Vital Sign    Worried About Running Out of Food in the Last Year: Never true    Ran Out of Food in the Last Year: Never true  Transportation Needs: No Transportation  Needs (06/12/2022)   PRAPARE - Hydrologist (Medical): No    Lack of  Transportation (Non-Medical): No  Physical Activity: Not on file  Stress: Not on file  Social Connections: Not on file  Intimate Partner Violence: Not At Risk (06/12/2022)   Humiliation, Afraid, Rape, and Kick questionnaire    Fear of Current or Ex-Partner: No    Emotionally Abused: No    Physically Abused: No    Sexually Abused: No  :  Review of Systems  Constitutional: Negative.   HENT: Negative.  Negative for hearing loss.   Eyes: Negative.   Respiratory: Negative.    Cardiovascular: Negative.   Gastrointestinal: Negative.   Genitourinary: Negative.   Musculoskeletal:  Positive for back pain.  Skin: Negative.   Neurological: Negative.   Endo/Heme/Allergies: Negative.   Psychiatric/Behavioral: Negative.       Exam: Vital signs are temperature of 97.7.  Pulse 88.  Blood pressure 131/66.  Weight is 151 pounds.  '@IPVITALS'$ @ Physical Exam Vitals reviewed.  HENT:     Head: Normocephalic and atraumatic.  Eyes:     Pupils: Pupils are equal, round, and reactive to light.  Cardiovascular:     Rate and Rhythm: Normal rate and regular rhythm.     Heart sounds: Normal heart sounds.  Pulmonary:     Effort: Pulmonary effort is normal.     Breath sounds: Normal breath sounds.  Abdominal:     General: Bowel sounds are normal.     Palpations: Abdomen is soft.  Musculoskeletal:        General: No tenderness or deformity. Normal range of motion.     Cervical back: Normal range of motion.  Lymphadenopathy:     Cervical: No cervical adenopathy.  Skin:    General: Skin is warm and dry.     Findings: No erythema or rash.  Neurological:     Mental Status: He is alert and oriented to person, place, and time.  Psychiatric:        Behavior: Behavior normal.        Thought Content: Thought content normal.        Judgment: Judgment normal.     Recent Labs    06/12/22 1040  WBC 5.1  HGB 13.8  HCT 41.0  PLT 180    Recent Labs    06/12/22 1040  NA 140  K 3.8  CL 103  CO2  28  GLUCOSE 136*  BUN 20  CREATININE 1.29*  CALCIUM 9.9    Blood smear review: None  Pathology: See above    Assessment and Plan: Patrick Adams is a nice 77 year old white male.  He has metastatic adenocarcinoma of the lung.  He had stage Ib adenocarcinoma.  He had this resected.  He did not want to have any adjuvant therapy.  He now has metastatic disease.  He had radiation therapy.  We really need to see where things stand.  I would think that the PET scan would show up metastatic disease.  I still think this is the gold standard for staging recurrent disease.  We will go ahead and get a PET scan set up for him.  He does need to have a bisphosphonate.  Hopefully, his insurance will allow [place] to use Xgeva.  I told him that we could always consider immunotherapy for treatment.  I think he will be able to tolerate immunotherapy.  He does seem to be in good shape.  I think he just  has misconceptions about chemotherapy and the side effects.  For right now, we will see about having the scans done in a few weeks.  I would like to see him back in about a month or so.  When we see him back we will do his first Xgeva.  I thanked him for his service to our country.  I definitely appreciate his sacrifice for freedom.

## 2022-07-15 ENCOUNTER — Encounter (HOSPITAL_BASED_OUTPATIENT_CLINIC_OR_DEPARTMENT_OTHER): Payer: Self-pay | Admitting: Emergency Medicine

## 2022-07-15 ENCOUNTER — Emergency Department (HOSPITAL_BASED_OUTPATIENT_CLINIC_OR_DEPARTMENT_OTHER): Payer: Medicare HMO

## 2022-07-15 ENCOUNTER — Emergency Department (HOSPITAL_BASED_OUTPATIENT_CLINIC_OR_DEPARTMENT_OTHER)
Admission: EM | Admit: 2022-07-15 | Discharge: 2022-07-15 | Disposition: A | Discharge status: Home / Self Care | Payer: Medicare HMO | Attending: Emergency Medicine | Admitting: Emergency Medicine

## 2022-07-15 ENCOUNTER — Other Ambulatory Visit: Payer: Self-pay

## 2022-07-15 ENCOUNTER — Other Ambulatory Visit (HOSPITAL_BASED_OUTPATIENT_CLINIC_OR_DEPARTMENT_OTHER): Payer: Self-pay

## 2022-07-15 DIAGNOSIS — K5900 Constipation, unspecified: Secondary | ICD-10-CM | POA: Insufficient documentation

## 2022-07-15 DIAGNOSIS — R1032 Left lower quadrant pain: Secondary | ICD-10-CM | POA: Insufficient documentation

## 2022-07-15 DIAGNOSIS — I10 Essential (primary) hypertension: Secondary | ICD-10-CM | POA: Diagnosis not present

## 2022-07-15 DIAGNOSIS — Z85118 Personal history of other malignant neoplasm of bronchus and lung: Secondary | ICD-10-CM | POA: Diagnosis not present

## 2022-07-15 LAB — CBC WITH DIFFERENTIAL/PLATELET
Abs Immature Granulocytes: 0.02 10*3/uL (ref 0.00–0.07)
Basophils Absolute: 0 10*3/uL (ref 0.0–0.1)
Basophils Relative: 0 %
Eosinophils Absolute: 0 10*3/uL (ref 0.0–0.5)
Eosinophils Relative: 1 %
HCT: 39.3 % (ref 39.0–52.0)
Hemoglobin: 13.4 g/dL (ref 13.0–17.0)
Immature Granulocytes: 0 %
Lymphocytes Relative: 7 %
Lymphs Abs: 0.4 10*3/uL — ABNORMAL LOW (ref 0.7–4.0)
MCH: 31.2 pg (ref 26.0–34.0)
MCHC: 34.1 g/dL (ref 30.0–36.0)
MCV: 91.4 fL (ref 80.0–100.0)
Monocytes Absolute: 0.4 10*3/uL (ref 0.1–1.0)
Monocytes Relative: 6 %
Neutro Abs: 5.6 10*3/uL (ref 1.7–7.7)
Neutrophils Relative %: 86 %
Platelets: 251 10*3/uL (ref 150–400)
RBC: 4.3 MIL/uL (ref 4.22–5.81)
RDW: 13 % (ref 11.5–15.5)
WBC: 6.4 10*3/uL (ref 4.0–10.5)
nRBC: 0 % (ref 0.0–0.2)

## 2022-07-15 LAB — URINALYSIS, ROUTINE W REFLEX MICROSCOPIC
Bacteria, UA: NONE SEEN
Bilirubin Urine: NEGATIVE
Glucose, UA: NEGATIVE mg/dL
Hgb urine dipstick: NEGATIVE
Ketones, ur: NEGATIVE mg/dL
Leukocytes,Ua: NEGATIVE
Nitrite: NEGATIVE
Protein, ur: 30 mg/dL — AB
Specific Gravity, Urine: >1.046 — ABNORMAL HIGH (ref 1.005–1.030)
pH: 6 (ref 5.0–8.0)

## 2022-07-15 LAB — BASIC METABOLIC PANEL
Anion gap: 9 (ref 5–15)
BUN: 16 mg/dL (ref 8–23)
CO2: 25 mmol/L (ref 22–32)
Calcium: 10.3 mg/dL (ref 8.9–10.3)
Chloride: 101 mmol/L (ref 98–111)
Creatinine, Ser: 1.26 mg/dL — ABNORMAL HIGH (ref 0.61–1.24)
GFR, Estimated: 60 mL/min — ABNORMAL LOW (ref >60)
Glucose, Bld: 95 mg/dL (ref 70–99)
Potassium: 4.4 mmol/L (ref 3.5–5.1)
Sodium: 135 mmol/L (ref 135–145)

## 2022-07-15 MED ORDER — ONDANSETRON HCL 4 MG/2ML IJ SOLN
4.0000 mg | Freq: Once | INTRAMUSCULAR | Status: AC
  Administered 2022-07-15: 4 mg via INTRAVENOUS
  Filled 2022-07-15: qty 2

## 2022-07-15 MED ORDER — IOHEXOL 300 MG/ML  SOLN
100.0000 mL | Freq: Once | INTRAMUSCULAR | Status: AC | PRN
  Administered 2022-07-15: 75 mL via INTRAVENOUS

## 2022-07-15 MED ORDER — FENTANYL CITRATE PF 50 MCG/ML IJ SOSY
50.0000 ug | PREFILLED_SYRINGE | Freq: Once | INTRAMUSCULAR | Status: AC
  Administered 2022-07-15: 50 ug via INTRAVENOUS
  Filled 2022-07-15: qty 1

## 2022-07-15 NOTE — Discharge Instructions (Signed)
You are seen today in the emergency department due to abdominal pain.  Your workup today was very reassuring, CT scan showed the known aneurysms with no acute process.  Please follow-up with gastroenterology as well as your primary, take MiraLAX and docusate for the constipation.  Drink plenty of fluids, turn to the ED if you have new or worsening or concerning symptoms.

## 2022-07-15 NOTE — ED Triage Notes (Signed)
Pt arrives to ED with c/o left sided groin pain x4 days.

## 2022-07-15 NOTE — ED Notes (Signed)
Discharge instructions and follow up care reviewed and explained, pt verbalized understanding and had no further questions on d/c. Pt caox4, ambulatory, NAD on d/c. 

## 2022-07-15 NOTE — ED Provider Notes (Signed)
[place] EMERGENCY DEPARTMENT AT [facility] Provider Note   CSN: [id number] Arrival date & time: 07/15/22  1234     History  Chief Complaint  Patient presents with   Groin Pain    Elic Vencill Meunier is a 77 y.o. male.   Groin Pain     Patient with medical history of hyperlipidemia, hypertension, small cell lung cancer status post radiation no longer on chemotherapy presents to the emergency department due to abdominal pain/left groin pain.  Symptoms started 4 days ago after being constipated over the weekend with significant straining.  He is having a bulging lump in his left groin which is worse when he stands up or strains.  Associated with nausea but no vomiting, without abdominal pain.  Home Medications Prior to Admission medications   Medication Sig Start Date End Date Taking? Authorizing Provider  acetaminophen (TYLENOL) 500 MG tablet Take 1,000 mg by mouth every 6 (six) hours as needed for moderate pain or headache.    [provider]  cholecalciferol (VITAMIN D3) 25 MCG (1000 UT) tablet Take 1,000 Units by mouth daily.    [provider]  clopidogrel (PLAVIX) 75 MG tablet TAKE 1 TABLET BY MOUTH EVERY DAY 05/06/22   Marykay Lex, MD  losartan (COZAAR) 100 MG tablet TAKE 1 TABLET BY MOUTH EVERY DAY 03/06/22   Marykay Lex, MD  nitroGLYCERIN (NITROSTAT) 0.4 MG SL tablet Place 1 tablet (0.4 mg total) under the tongue every 5 (five) minutes as needed for chest pain. 07/20/19 09/10/22  Marykay Lex, MD  polyethylene glycol (MIRALAX / GLYCOLAX) 17 g packet Take 17 g by mouth daily as needed (Constipation). 05/24/19   Sharlene Dory, DO  pregabalin (LYRICA) 50 MG capsule Take 50 mg by mouth 3 (three) times daily. Patient not taking: Reported on 06/12/2022 02/24/22 04/16/22  [provider]  sildenafil (VIAGRA) 100 MG tablet Take 1 tablet (100 mg total) by mouth daily as needed for erectile dysfunction. 09/10/20   Marykay Lex, MD       Allergies    Brilinta [ticagrelor], Ciprofloxacin, and Statins    Review of Systems   Review of Systems  Physical Exam Updated Vital Signs BP 117/80 (BP Location: Right Arm)   Pulse 99   Temp 97.6 F (36.4 C)   Resp 18   Ht  (1.803 m)   Wt 68 kg   SpO2 97%   BMI 20.92 kg/m  Physical Exam Vitals and nursing note reviewed. Exam conducted with a chaperone present.  Constitutional:      Appearance: Normal appearance.  HENT:     Head: Normocephalic and atraumatic.  Eyes:     General: No scleral icterus.       Right eye: No discharge.        Left eye: No discharge.     Extraocular Movements: Extraocular movements intact.     Pupils: Pupils are equal, round, and reactive to light.  Cardiovascular:     Rate and Rhythm: Normal rate and regular rhythm.     Pulses: Normal pulses.     Heart sounds: Normal heart sounds. No murmur heard.    No friction rub. No gallop.  Pulmonary:     Effort: Pulmonary effort is normal. No respiratory distress.     Breath sounds: Normal breath sounds.  Abdominal:     General: Abdomen is flat. Bowel sounds are normal. There is no distension.     Palpations: Abdomen is soft.  Tenderness: There is abdominal tenderness.     Comments: Left lower quadrant abdominal tenderness, mild left inguinal tenderness but no appreciable mass or hernia  Genitourinary:    Penis: Normal.      Testes: Normal.  Skin:    General: Skin is warm and dry.     Coloration: Skin is not jaundiced.  Neurological:     Mental Status: He is alert. Mental status is at baseline.     Coordination: Coordination normal.     ED Results / Procedures / Treatments   Labs (all labs ordered are listed, but only abnormal results are displayed) Labs Reviewed  BASIC METABOLIC PANEL  CBC WITH DIFFERENTIAL/PLATELET  URINALYSIS, ROUTINE W REFLEX MICROSCOPIC    EKG None  Radiology No results found.  Procedures Procedures    Medications Ordered in  ED Medications  ondansetron (ZOFRAN) injection 4 mg (has no administration in time range)  fentaNYL (SUBLIMAZE) injection 50 mcg (has no administration in time range)    ED Course/ Medical Decision Making/ A&P Clinical Course as of 07/15/22 1735  Tue Jul 15, 2022  1621 CT ABDOMEN PELVIS W CONTRAST [KS]  1735 CT ABDOMEN PELVIS W CONTRAST [HS]    Clinical Course User Index [HS] Theron Arista, PA-C [KS] Tennis Ship                             Medical Decision Making Amount and/or Complexity of Data Reviewed Labs: ordered. Radiology: ordered. Decision-making details documented in ED Course.  Risk Prescription drug management.   Patient is a 77 year old male presenting to the emergency department due to left inguinal/left lower quadrant abdominal pain per differential includes diverticulitis, hernia, UTI, pyelonephritis, metastatic disease, SBO  CBC without cytosis or anemia.  BMP is without gross electro derangement, creatinine is baseline at 1.26.  UA is negative for UTI.  CT abdomen is negative for any acute process.  I consulted with radiologist and confirmed there is no abnormality in the left lower quadrant, specifically no diverticulitis, hernia, and mass.  He has known aneurysms which is he is being followed for this no changes. CT mentioned is a possible infectious/inflammatory process noted in the right lower lung, patient does have a history of cancer.  He is not coughing, no fever, no white count I do not suspect pneumonia at this time.  Suspect pain is likely secondary to constipation, bowel regimen was recommended as well as gastroenterology follow-up.  Return precautions were discussed with the patient who verbalized understanding and agreed with the plan.  Given stable vital signs, negative imaging and laboratory workup I do feel he is appropriate for close outpatient follow-up        Final Clinical Impression(s) / ED Diagnoses Final diagnoses:   None    Rx / DC Orders ED Discharge Orders     None         Theron Arista, PA-C 07/15/22 1744    Ernie Avena, MD 07/15/22 [phone]

## 2022-08-06 ENCOUNTER — Ambulatory Visit (HOSPITAL_COMMUNITY)
Admission: RE | Admit: 2022-08-06 | Discharge: 2022-08-06 | Disposition: A | Discharge status: Home / Self Care | Payer: Medicare HMO | Source: Ambulatory Visit | Attending: Hematology & Oncology | Admitting: Hematology & Oncology

## 2022-08-06 DIAGNOSIS — C3490 Malignant neoplasm of unspecified part of unspecified bronchus or lung: Secondary | ICD-10-CM | POA: Insufficient documentation

## 2022-08-06 LAB — GLUCOSE, CAPILLARY: Glucose-Capillary: 84 mg/dL (ref 70–99)

## 2022-08-06 MED ORDER — FLUDEOXYGLUCOSE F - 18 (FDG) INJECTION
8.0000 | Freq: Once | INTRAVENOUS | Status: AC | PRN
  Administered 2022-08-06: 8 via INTRAVENOUS

## 2022-11-27 ENCOUNTER — Telehealth: Payer: Self-pay | Admitting: Cardiology

## 2022-11-27 NOTE — Telephone Encounter (Signed)
Will forward this to the Dr. Herbie Baltimore for recommendation.

## 2022-11-27 NOTE — Telephone Encounter (Signed)
Pt c/o medication issue:  1. Name of Medication: Advil  2. How are you currently taking this medication (dosage and times per day)?   3. Are you having a reaction (difficulty breathing--STAT)?   4. What is your medication issue?    Patient stated he has bone cancer and will be taking Advil for pain.  Patient wants to know if he should stop taking his blood thinner.

## 2022-11-28 NOTE — Telephone Encounter (Signed)
Called patient to obtain more information  Patient states he has not taken clopidogrel in 2 days, because He started taking Advil one tablet every 6 hour for the last 2 days.  Patient states he has not taken both because he was afraid.  Patient also wanted to know if Oncology wanted him to take tramadol for pain could he?  He wanted to make sure it would not interfere with any cardiac medication.  Patient is aware awaiting  for  rsponse from Dr Herbie Baltimore or [facility] pharmacy

## 2022-11-28 NOTE — Telephone Encounter (Signed)
Advil is not a substitute for Plavix, he should resume his Plavix. He should minimize NSAID use as it can increase the risk of bleeding as well as CAD which he already has. Tramadol would be just fine to use for pain.

## 2022-11-28 NOTE — Telephone Encounter (Signed)
Patient is taking 75 mg clopidogrel  daily

## 2022-12-02 NOTE — Telephone Encounter (Signed)
Called informed patient to restart taking Plavix. Patient states understanding the increase risk  of using  Advil and Plavix for long periods of time. Voiced understanding.

## 2022-12-04 ENCOUNTER — Telehealth: Payer: Self-pay

## 2022-12-04 ENCOUNTER — Inpatient Hospital Stay: Payer: Medicare HMO | Attending: Hematology & Oncology

## 2022-12-04 ENCOUNTER — Inpatient Hospital Stay: Payer: Medicare HMO

## 2022-12-04 ENCOUNTER — Ambulatory Visit: Payer: Medicare HMO

## 2022-12-04 ENCOUNTER — Encounter: Payer: Self-pay | Admitting: Hematology & Oncology

## 2022-12-04 ENCOUNTER — Inpatient Hospital Stay: Payer: Medicare HMO | Admitting: Hematology & Oncology

## 2022-12-04 VITALS — BP 131/60 | HR 71 | Temp 98.1°F | Resp 18 | Wt 146.8 lb

## 2022-12-04 DIAGNOSIS — F17210 Nicotine dependence, cigarettes, uncomplicated: Secondary | ICD-10-CM | POA: Insufficient documentation

## 2022-12-04 DIAGNOSIS — R630 Anorexia: Secondary | ICD-10-CM | POA: Diagnosis not present

## 2022-12-04 DIAGNOSIS — C3490 Malignant neoplasm of unspecified part of unspecified bronchus or lung: Secondary | ICD-10-CM

## 2022-12-04 DIAGNOSIS — Z7983 Long term (current) use of bisphosphonates: Secondary | ICD-10-CM | POA: Insufficient documentation

## 2022-12-04 DIAGNOSIS — C7951 Secondary malignant neoplasm of bone: Secondary | ICD-10-CM | POA: Insufficient documentation

## 2022-12-04 LAB — CMP (CANCER CENTER ONLY)
ALT: 7 U/L (ref 0–44)
AST: 10 U/L — ABNORMAL LOW (ref 15–41)
Albumin: 4.3 g/dL (ref 3.5–5.0)
Alkaline Phosphatase: 100 U/L (ref 38–126)
Anion gap: 6 (ref 5–15)
BUN: 29 mg/dL — ABNORMAL HIGH (ref 8–23)
CO2: 29 mmol/L (ref 22–32)
Calcium: 10.5 mg/dL — ABNORMAL HIGH (ref 8.9–10.3)
Chloride: 102 mmol/L (ref 98–111)
Creatinine: 1.2 mg/dL (ref 0.61–1.24)
GFR, Estimated: >60 mL/min (ref >60)
Glucose, Bld: 83 mg/dL (ref 70–99)
Potassium: 4.6 mmol/L (ref 3.5–5.1)
Sodium: 137 mmol/L (ref 135–145)
Total Bilirubin: 0.6 mg/dL (ref 0.3–1.2)
Total Protein: 6.8 g/dL (ref 6.5–8.1)

## 2022-12-04 LAB — CBC WITH DIFFERENTIAL (CANCER CENTER ONLY)
Abs Immature Granulocytes: 0.02 10*3/uL (ref 0.00–0.07)
Basophils Absolute: 0 10*3/uL (ref 0.0–0.1)
Basophils Relative: 0 %
Eosinophils Absolute: 0.1 10*3/uL (ref 0.0–0.5)
Eosinophils Relative: 1 %
HCT: 42.5 % (ref 39.0–52.0)
Hemoglobin: 14.3 g/dL (ref 13.0–17.0)
Immature Granulocytes: 0 %
Lymphocytes Relative: 11 %
Lymphs Abs: 0.7 10*3/uL (ref 0.7–4.0)
MCH: 30.8 pg (ref 26.0–34.0)
MCHC: 33.6 g/dL (ref 30.0–36.0)
MCV: 91.6 fL (ref 80.0–100.0)
Monocytes Absolute: 0.5 10*3/uL (ref 0.1–1.0)
Monocytes Relative: 7 %
Neutro Abs: 5.3 10*3/uL (ref 1.7–7.7)
Neutrophils Relative %: 81 %
Platelet Count: 201 10*3/uL (ref 150–400)
RBC: 4.64 MIL/uL (ref 4.22–5.81)
RDW: 12.7 % (ref 11.5–15.5)
WBC Count: 6.6 10*3/uL (ref 4.0–10.5)
nRBC: 0 % (ref 0.0–0.2)

## 2022-12-04 LAB — LACTATE DEHYDROGENASE: LDH: 128 U/L (ref 98–192)

## 2022-12-04 MED ORDER — DENOSUMAB 120 MG/1.7ML ~~LOC~~ SOLN
120.0000 mg | Freq: Once | SUBCUTANEOUS | Status: AC
  Administered 2022-12-04: 120 mg via SUBCUTANEOUS
  Filled 2022-12-04: qty 1.7

## 2022-12-04 MED ORDER — DRONABINOL 5 MG PO CAPS: 5.0000 mg | ORAL_CAPSULE | Freq: Two times a day (BID) | ORAL | 0 refills | Status: DC

## 2022-12-04 NOTE — Telephone Encounter (Signed)
Notified Patient of prior authorization approval for Dronabinol 5mg  capsules. Medication is approved through 06/02/2023. No other needs or concerns voiced at this time.

## 2022-12-04 NOTE — Progress Notes (Signed)
Hematology and Oncology Follow Up Visit  Patrick Adams [id number] 1945-11-01 77 y.o. 12/04/2022   Principle Diagnosis:  Metastatic non-small cell lung cancer-adenocarcinoma. --- NO actionable mutations  Current Therapy:   Xgeva 120 mg subcu every 3 months-next dose in 03/2023     Interim History:  Patrick Adams is back for a long way to visit.  I first saw him back in March of this year.  I am not sure why it never showed up afterwards.  He did have a PET scan that was done.  This is on 08/06/2022.  There is show some small mediastinal hilar lymph nodes on the right side.  These were indeterminate.  There is no obvious disease noted elsewhere.  He had an area of hypermetabolic opacity in the posterior right hemithorax.  It is felt that this was favoring radiation.  He is having pain over on the right side.  This in the right lower lateral rib cage.  He has had this for about a month or so.  He takes Tylenol with little Advil.  This does seem to help.  He has no shortness of breath.  He still smokes a pack a day of cigarettes.  He has had no problems with COVID.  He has had no problems with bleeding.  There is no change in bowel or bladder habits.  He has had no headache.  He is worried about not gaining weight.  His appetite is okay.  Will try him on a little Marinol (5 mg p.o. twice daily) and let see if this may help.  Currently, I would have said that his performance status is probably ECOG 0.  Medications:  Current Outpatient Medications:    acetaminophen (TYLENOL) 500 MG tablet, Take 1,000 mg by mouth every 6 (six) hours as needed for moderate pain or headache., Disp: , Rfl:    cholecalciferol (VITAMIN D3) 25 MCG (1000 UT) tablet, Take 1,000 Units by mouth daily., Disp: , Rfl:    clopidogrel (PLAVIX) 75 MG tablet, TAKE 1 TABLET BY MOUTH EVERY DAY, Disp: 90 tablet, Rfl: 2   dronabinol (MARINOL) 5 MG capsule, Take 1 capsule (5 mg total) by mouth 2 (two) times daily before a meal., Disp:  60 capsule, Rfl: 0   ibuprofen (ADVIL) 200 MG tablet, Take 200 mg by mouth. As needed, Disp: , Rfl:    losartan (COZAAR) 100 MG tablet, TAKE 1 TABLET BY MOUTH EVERY DAY, Disp: 90 tablet, Rfl: 3   polyethylene glycol (MIRALAX / GLYCOLAX) 17 g packet, Take 17 g by mouth daily as needed (Constipation)., Disp: 14 each, Rfl: 0   sildenafil (VIAGRA) 100 MG tablet, Take 1 tablet (100 mg total) by mouth daily as needed for erectile dysfunction., Disp: 10 tablet, Rfl: 6   nitroGLYCERIN (NITROSTAT) 0.4 MG SL tablet, Place 1 tablet (0.4 mg total) under the tongue every 5 (five) minutes as needed for chest pain., Disp: 100 tablet, Rfl: 3   pregabalin (LYRICA) 50 MG capsule, Take 50 mg by mouth 3 (three) times daily. (Patient not taking: Reported on 06/12/2022), Disp: , Rfl:   Allergies:  Allergies  Allergen Reactions   Brilinta [Ticagrelor] Shortness Of Breath     Patient was taking 60 mg  Twice a day - changed to Clopidogrel 75 mg  03/02/19   Ciprofloxacin Anaphylaxis   Statins Other (See Comments)    Joint pain.Myalgias -- HAS TRIED LIPITOR, ZOCOR & CRESTOR    Past Medical History, Surgical history, Social history, and Family History  were reviewed and updated.  Review of Systems: Review of Systems  Constitutional:  Positive for unexpected weight change.  HENT:  Negative.    Eyes: Negative.   Respiratory: Negative.    Cardiovascular:  Positive for chest pain.  Gastrointestinal: Negative.   Endocrine: Negative.   Genitourinary: Negative.    Musculoskeletal: Negative.   Skin: Negative.   Neurological: Negative.   Hematological: Negative.   Psychiatric/Behavioral: Negative.      Physical Exam:  weight is 146 lb 12.8 oz (66.6 kg). His oral temperature is 98.1 F (36.7 C). His blood pressure is 131/60 and his pulse is 71. His respiration is 18 and oxygen saturation is 100%.   Wt Readings from Last 3 Encounters:  12/04/22 146 lb 12.8 oz (66.6 kg)  07/15/22 150 lb (68 kg)  06/12/22 151 lb 1.3  oz (68.5 kg)    Physical Exam Vitals reviewed.  HENT:     Head: Normocephalic and atraumatic.  Eyes:     Pupils: Pupils are equal, round, and reactive to light.  Cardiovascular:     Rate and Rhythm: Normal rate and regular rhythm.     Heart sounds: Normal heart sounds.  Pulmonary:     Effort: Pulmonary effort is normal.     Breath sounds: Normal breath sounds.  Abdominal:     General: Bowel sounds are normal.     Palpations: Abdomen is soft.  Musculoskeletal:        General: No tenderness or deformity. Normal range of motion.     Cervical back: Normal range of motion.     Comments: He does have a little bit of tenderness in the right lateral lower rib cage.  No masses are noted.  There is no swelling as noted.  Lymphadenopathy:     Cervical: No cervical adenopathy.  Skin:    General: Skin is warm and dry.     Findings: No erythema or rash.  Neurological:     Mental Status: He is alert and oriented to person, place, and time.  Psychiatric:        Behavior: Behavior normal.        Thought Content: Thought content normal.        Judgment: Judgment normal.      Lab Results  Component Value Date   WBC 6.6 12/04/2022   HGB 14.3 12/04/2022   HCT 42.5 12/04/2022   MCV 91.6 12/04/2022   PLT 201 12/04/2022     Chemistry      Component Value Date/Time   NA 137 12/04/2022 1336   NA 138 04/11/2021 1130   K 4.6 12/04/2022 1336   CL 102 12/04/2022 1336   CO2 29 12/04/2022 1336   BUN 29 (H) 12/04/2022 1336   BUN 20 04/11/2021 1130   CREATININE 1.20 12/04/2022 1336      Component Value Date/Time   CALCIUM 10.5 (H) 12/04/2022 1336   ALKPHOS 100 12/04/2022 1336   AST 10 (L) 12/04/2022 1336   ALT 7 12/04/2022 1336   BILITOT 0.6 12/04/2022 1336       Impression and Plan: Patrick Adams is a very nice 77 year old white male.  He has a history of adenocarcinoma of the lung.  He seemed to have an area of disease on MRI that was in the right eighth and 10th rib.  I think he had  radiation for this as completed in February.  Again, we really need to see about another PET scan on.  It is certainly possible that  where he had the recurrence over on the right chest wall that he may have disease there.  Hopefully, the Marinol will help with his weight.  I am happy that he came back to see [place].  It is always nice to see him.  He served our country.  We will certainly serve him.  I will plan to have him come back in about a month.  He will get his Rivka Barbara today.   Josph Macho, MD 9/5/20245:11 PM

## 2022-12-04 NOTE — Patient Instructions (Signed)
Denosumab Injection (Oncology) What is this medication? DENOSUMAB (den oh SUE mab) prevents weakened bones caused by cancer. It may also be used to treat noncancerous bone tumors that cannot be removed by surgery. It can also be used to treat high calcium levels in the blood caused by cancer. It works by blocking a protein that causes bones to break down quickly. This slows down the release of calcium from bones, which lowers calcium levels in your blood. It also makes your bones stronger and less likely to break (fracture). This medicine may be used for other purposes; ask your health care provider or pharmacist if you have questions. COMMON BRAND NAME(S): XGEVA What should I tell my care team before I take this medication? They need to know if you have any of these conditions: Dental disease Having surgery or tooth extraction Infection Kidney disease Low levels of calcium or vitamin D in the blood Malnutrition On hemodialysis Skin conditions or sensitivity Thyroid or parathyroid disease An unusual reaction to denosumab, other medications, foods, dyes, or preservatives Pregnant or trying to get pregnant Breast-feeding How should I use this medication? This medication is for injection under the skin. It is given by your care team in a hospital or clinic setting. A special MedGuide will be given to you before each treatment. Be sure to read this information carefully each time. Talk to your care team about the use of this medication in children. While it may be prescribed for children as young as 13 years for selected conditions, precautions do apply. Overdosage: If you think you have taken too much of this medicine contact a poison control center or emergency room at once. NOTE: This medicine is only for you. Do not share this medicine with others. What if I miss a dose? Keep appointments for follow-up doses. It is important not to miss your dose. Call your care team if you are unable to  keep an appointment. What may interact with this medication? Do not take this medication with any of the following: Other medications containing denosumab This medication may also interact with the following: Medications that lower your chance of fighting infection Steroid medications, such as prednisone or cortisone This list may not describe all possible interactions. Give your health care provider a list of all the medicines, herbs, non-prescription drugs, or dietary supplements you use. Also tell them if you smoke, drink alcohol, or use illegal drugs. Some items may interact with your medicine. What should I watch for while using this medication? Your condition will be monitored carefully while you are receiving this medication. You may need blood work while taking this medication. This medication may increase your risk of getting an infection. Call your care team for advice if you get a fever, chills, sore throat, or other symptoms of a cold or flu. Do not treat yourself. Try to avoid being around people who are sick. You should make sure you get enough calcium and vitamin D while you are taking this medication, unless your care team tells you not to. Discuss the foods you eat and the vitamins you take with your care team. Some people who take this medication have severe bone, joint, or muscle pain. This medication may also increase your risk for jaw problems or a broken thigh bone. Tell your care team right away if you have severe pain in your jaw, bones, joints, or muscles. Tell your care team if you have any pain that does not go away or that gets worse. Talk   to your care team if you may be pregnant. Serious birth defects can occur if you take this medication during pregnancy and for 5 months after the last dose. You will need a negative pregnancy test before starting this medication. Contraception is recommended while taking this medication and for 5 months after the last dose. Your care team  can help you find the option that works for you. What side effects may I notice from receiving this medication? Side effects that you should report to your care team as soon as possible: Allergic reactions--skin rash, itching, hives, swelling of the face, lips, tongue, or throat Bone, joint, or muscle pain Low calcium level--muscle pain or cramps, confusion, tingling, or numbness in the hands or feet Osteonecrosis of the jaw--pain, swelling, or redness in the mouth, numbness of the jaw, poor healing after dental work, unusual discharge from the mouth, visible bones in the mouth Side effects that usually do not require medical attention (report to your care team if they continue or are bothersome): Cough Diarrhea Fatigue Headache Nausea This list may not describe all possible side effects. Call your doctor for medical advice about side effects. You may report side effects to FDA at 1-800-FDA-1088. Where should I keep my medication? This medication is given in a hospital or clinic. It will not be stored at home. NOTE: This sheet is a summary. It may not cover all possible information. If you have questions about this medicine, talk to your doctor, pharmacist, or health care provider.  2024 Elsevier/Gold Standard (2021-08-07 00:00:00)  

## 2022-12-05 ENCOUNTER — Other Ambulatory Visit: Payer: Self-pay | Admitting: Cardiology

## 2022-12-25 ENCOUNTER — Ambulatory Visit (HOSPITAL_COMMUNITY)
Admission: RE | Admit: 2022-12-25 | Discharge: 2022-12-25 | Disposition: A | Discharge status: Home / Self Care | Payer: Medicare HMO | Source: Ambulatory Visit | Attending: Hematology & Oncology | Admitting: Hematology & Oncology

## 2022-12-25 DIAGNOSIS — C7951 Secondary malignant neoplasm of bone: Secondary | ICD-10-CM | POA: Insufficient documentation

## 2022-12-25 DIAGNOSIS — C3490 Malignant neoplasm of unspecified part of unspecified bronchus or lung: Secondary | ICD-10-CM | POA: Diagnosis present

## 2022-12-25 LAB — GLUCOSE, CAPILLARY: Glucose-Capillary: 89 mg/dL (ref 70–99)

## 2022-12-25 MED ORDER — FLUDEOXYGLUCOSE F - 18 (FDG) INJECTION
7.3000 | Freq: Once | INTRAVENOUS | Status: AC
  Administered 2022-12-25: 7.15 via INTRAVENOUS

## 2023-01-19 ENCOUNTER — Inpatient Hospital Stay: Payer: Medicare HMO | Admitting: Hematology & Oncology

## 2023-01-19 ENCOUNTER — Inpatient Hospital Stay: Payer: Medicare HMO | Attending: Hematology & Oncology

## 2023-01-19 ENCOUNTER — Other Ambulatory Visit: Payer: Self-pay | Admitting: *Deleted

## 2023-01-19 ENCOUNTER — Encounter: Payer: Self-pay | Admitting: Hematology & Oncology

## 2023-01-19 VITALS — BP 129/61 | HR 71 | Temp 98.5°F | Resp 20 | Ht 72.0 in | Wt 149.0 lb

## 2023-01-19 DIAGNOSIS — Z23 Encounter for immunization: Secondary | ICD-10-CM | POA: Diagnosis not present

## 2023-01-19 DIAGNOSIS — R918 Other nonspecific abnormal finding of lung field: Secondary | ICD-10-CM | POA: Insufficient documentation

## 2023-01-19 DIAGNOSIS — C3490 Malignant neoplasm of unspecified part of unspecified bronchus or lung: Secondary | ICD-10-CM

## 2023-01-19 DIAGNOSIS — Z85118 Personal history of other malignant neoplasm of bronchus and lung: Secondary | ICD-10-CM | POA: Diagnosis present

## 2023-01-19 DIAGNOSIS — C7951 Secondary malignant neoplasm of bone: Secondary | ICD-10-CM | POA: Diagnosis not present

## 2023-01-19 LAB — CMP (CANCER CENTER ONLY)
ALT: 8 U/L (ref 0–44)
AST: 10 U/L — ABNORMAL LOW (ref 15–41)
Albumin: 4.4 g/dL (ref 3.5–5.0)
Alkaline Phosphatase: 93 U/L (ref 38–126)
Anion gap: 8 (ref 5–15)
BUN: 23 mg/dL (ref 8–23)
CO2: 28 mmol/L (ref 22–32)
Calcium: 9.7 mg/dL (ref 8.9–10.3)
Chloride: 102 mmol/L (ref 98–111)
Creatinine: 1.37 mg/dL — ABNORMAL HIGH (ref 0.61–1.24)
GFR, Estimated: 54 mL/min — ABNORMAL LOW (ref >60)
Glucose, Bld: 95 mg/dL (ref 70–99)
Potassium: 4.7 mmol/L (ref 3.5–5.1)
Sodium: 138 mmol/L (ref 135–145)
Total Bilirubin: 0.5 mg/dL (ref 0.3–1.2)
Total Protein: 6.5 g/dL (ref 6.5–8.1)

## 2023-01-19 LAB — CBC WITH DIFFERENTIAL (CANCER CENTER ONLY)
Abs Immature Granulocytes: 0.02 10*3/uL (ref 0.00–0.07)
Basophils Absolute: 0 10*3/uL (ref 0.0–0.1)
Basophils Relative: 0 %
Eosinophils Absolute: 0.1 10*3/uL (ref 0.0–0.5)
Eosinophils Relative: 2 %
HCT: 41.6 % (ref 39.0–52.0)
Hemoglobin: 14.1 g/dL (ref 13.0–17.0)
Immature Granulocytes: 0 %
Lymphocytes Relative: 13 %
Lymphs Abs: 0.7 10*3/uL (ref 0.7–4.0)
MCH: 31.1 pg (ref 26.0–34.0)
MCHC: 33.9 g/dL (ref 30.0–36.0)
MCV: 91.6 fL (ref 80.0–100.0)
Monocytes Absolute: 0.5 10*3/uL (ref 0.1–1.0)
Monocytes Relative: 8 %
Neutro Abs: 4.5 10*3/uL (ref 1.7–7.7)
Neutrophils Relative %: 77 %
Platelet Count: 202 10*3/uL (ref 150–400)
RBC: 4.54 MIL/uL (ref 4.22–5.81)
RDW: 12.4 % (ref 11.5–15.5)
WBC Count: 5.8 10*3/uL (ref 4.0–10.5)
nRBC: 0 % (ref 0.0–0.2)

## 2023-01-19 LAB — LACTATE DEHYDROGENASE: LDH: 113 U/L (ref 98–192)

## 2023-01-19 NOTE — Progress Notes (Signed)
re

## 2023-01-19 NOTE — Progress Notes (Signed)
Hematology and Oncology Follow Up Visit  Patrick Adams [id number] November 15, 1945 77 y.o. 01/19/2023   Principle Diagnosis:  Metastatic non-small cell lung cancer-adenocarcinoma. --- NO actionable mutations  Current Therapy:   Xgeva 120 mg subcu every 3 months-next dose in 03/2023     Interim History:  Patrick Adams is back for a follow-up.  We actually did do a PET scan on him.  This was done on him 12/25/2022.  The PET scan did show that there was a new hypermetabolic lesion in the right upper lobe.  It measures 8 x 12 mm.  There is some hypermetabolic mediastinal right hilar lymph nodes.  Again, it is hard to say if these are truly malignant.  He might need to have a bronchoscopy to find out.  I think that he would be a candidate for radiotherapy.  I think SBRT would not be a bad idea for this right upper lobe lesion.  He is still smoking.  He is trying to cut back.  He is trying to stop if possible.  He has had no issues with bleeding.  There is no cough.  He has had no mopped assist.  He has had no issues with bowels or bladder.  He is mostly bothered by neuropathic pain where he had his thoracoscopy.  This is over in the right lateral chest wall.  He has been on different medications.  He has been tried on a Lidoderm patch.  Nothing seems to help.  When he does do it take Tylenol and Advil.  I do not think this is a bad idea.  I told him that he MUST take the Advil with food.  Overall, I would have said that his performance status is probably ECOG 1.   Medications:  Current Outpatient Medications:    acetaminophen (TYLENOL) 500 MG tablet, Take 1,000 mg by mouth every 6 (six) hours as needed for moderate pain or headache., Disp: , Rfl:    cholecalciferol (VITAMIN D3) 25 MCG (1000 UT) tablet, Take 1,000 Units by mouth daily., Disp: , Rfl:    clopidogrel (PLAVIX) 75 MG tablet, TAKE 1 TABLET BY MOUTH EVERY DAY, Disp: 90 tablet, Rfl: 0   ibuprofen (ADVIL) 200 MG tablet, Take 200 mg by mouth  2 (two) times daily. As needed, Disp: , Rfl:    losartan (COZAAR) 100 MG tablet, TAKE 1 TABLET BY MOUTH EVERY DAY, Disp: 90 tablet, Rfl: 0   polyethylene glycol (MIRALAX / GLYCOLAX) 17 g packet, Take 17 g by mouth daily as needed (Constipation)., Disp: 14 each, Rfl: 0   saccharomyces boulardii (FLORASTOR) 250 MG capsule, Take 250 mg by mouth daily., Disp: , Rfl:    dronabinol (MARINOL) 5 MG capsule, Take 1 capsule (5 mg total) by mouth 2 (two) times daily before a meal. (Patient not taking: Reported on 01/19/2023), Disp: 60 capsule, Rfl: 0   nitroGLYCERIN (NITROSTAT) 0.4 MG SL tablet, Place 1 tablet (0.4 mg total) under the tongue every 5 (five) minutes as needed for chest pain., Disp: 100 tablet, Rfl: 3   sildenafil (VIAGRA) 100 MG tablet, Take 1 tablet (100 mg total) by mouth daily as needed for erectile dysfunction. (Patient not taking: Reported on 01/19/2023), Disp: 10 tablet, Rfl: 6  Allergies:  Allergies  Allergen Reactions   Brilinta [Ticagrelor] Shortness Of Breath     Patient was taking 60 mg  Twice a day - changed to Clopidogrel 75 mg  03/02/19   Ciprofloxacin Anaphylaxis   Statins Other (See Comments)  Joint pain.Myalgias -- HAS TRIED LIPITOR, ZOCOR & CRESTOR    Past Medical History, Surgical history, Social history, and Family History were reviewed and updated.  Review of Systems: Review of Systems  Constitutional:  Positive for unexpected weight change.  HENT:  Negative.    Eyes: Negative.   Respiratory: Negative.    Cardiovascular:  Positive for chest pain.  Gastrointestinal: Negative.   Endocrine: Negative.   Genitourinary: Negative.    Musculoskeletal: Negative.   Skin: Negative.   Neurological: Negative.   Hematological: Negative.   Psychiatric/Behavioral: Negative.      Physical Exam:  height is 6' (1.829 m) and weight is 149 lb (67.6 kg). His oral temperature is 98.5 F (36.9 C). His blood pressure is 129/61 and his pulse is 71. His respiration is 20 and  oxygen saturation is 100%.   Wt Readings from Last 3 Encounters:  01/19/23 149 lb (67.6 kg)  12/04/22 146 lb 12.8 oz (66.6 kg)  07/15/22 150 lb (68 kg)    Physical Exam Vitals reviewed.  HENT:     Head: Normocephalic and atraumatic.  Eyes:     Pupils: Pupils are equal, round, and reactive to light.  Cardiovascular:     Rate and Rhythm: Normal rate and regular rhythm.     Heart sounds: Normal heart sounds.  Pulmonary:     Effort: Pulmonary effort is normal.     Breath sounds: Normal breath sounds.  Abdominal:     General: Bowel sounds are normal.     Palpations: Abdomen is soft.  Musculoskeletal:        General: No tenderness or deformity. Normal range of motion.     Cervical back: Normal range of motion.     Comments: He does have a little bit of tenderness in the right lateral lower rib cage.  No masses are noted.  There is no swelling as noted.  Lymphadenopathy:     Cervical: No cervical adenopathy.  Skin:    General: Skin is warm and dry.     Findings: No erythema or rash.  Neurological:     Mental Status: He is alert and oriented to person, place, and time.  Psychiatric:        Behavior: Behavior normal.        Thought Content: Thought content normal.        Judgment: Judgment normal.     Lab Results  Component Value Date   WBC 5.8 01/19/2023   HGB 14.1 01/19/2023   HCT 41.6 01/19/2023   MCV 91.6 01/19/2023   PLT 202 01/19/2023     Chemistry      Component Value Date/Time   NA 137 12/04/2022 1336   NA 138 04/11/2021 1130   K 4.6 12/04/2022 1336   CL 102 12/04/2022 1336   CO2 29 12/04/2022 1336   BUN 29 (H) 12/04/2022 1336   BUN 20 04/11/2021 1130   CREATININE 1.20 12/04/2022 1336      Component Value Date/Time   CALCIUM 10.5 (H) 12/04/2022 1336   ALKPHOS 100 12/04/2022 1336   AST 10 (L) 12/04/2022 1336   ALT 7 12/04/2022 1336   BILITOT 0.6 12/04/2022 1336       Impression and Plan: Patrick Adams is a very nice 77 year old white male.  He has a  history of adenocarcinoma of the lung.  He seemed to have an area of disease on MRI that was in the right eighth and 10th rib.  I think he had radiation for  this as completed in February.  For right now, I will have to see about radiosurgery.  I think this may not be a bad idea for him.  I was wondering if we needed to do any bronchoscopy to see if these mediastinal nodes are positive.  I long talk with him.  He understands the nature of the problem.  I explained how radiosurgery works.  He would be in agreement.  We will see about getting back to see [place] in about 6 weeks or so.  I would think that by then, he should be finished with his treatment.  Josph Macho, MD 10/21/202410:18 AM

## 2023-01-23 ENCOUNTER — Other Ambulatory Visit: Payer: Self-pay | Admitting: Radiation Oncology

## 2023-01-23 ENCOUNTER — Inpatient Hospital Stay
Admission: RE | Admit: 2023-01-23 | Discharge: 2023-01-23 | Disposition: A | Discharge status: Home / Self Care | Payer: Self-pay | Source: Ambulatory Visit | Attending: Radiation Oncology | Admitting: Radiation Oncology

## 2023-01-23 DIAGNOSIS — C3490 Malignant neoplasm of unspecified part of unspecified bronchus or lung: Secondary | ICD-10-CM

## 2023-01-27 NOTE — Progress Notes (Signed)
Location of tumor and Histology per Pathology Report: Metastatic non-small cell lung cancer-adenocarcinoma. --- NO actionable mutations   Biopsy:   CT biopsy (07/05/2019): positive for NSCLC/adenocarcinoma -lobectomy (08/12/2019): 3.2 cm adenocarcinoma, visceral pleura invasion, margins negative   Past/Anticipated interventions by surgeon, if any:   Past/Anticipate- d interventions by medical oncology, if any: Plan   1. Recurrent NSCLC of the RLL -agreed to forego adjuvant chemotherapy given lack of high risk features on his lobectomy specimen -reported increase in right sided back pain over several months; CT and PET scan without signs of recurrent disease, which was ultimately detected on MRI -s/p RT to right posterior ribs T8-T10 (30 Gy in 5 fractions, completed mid 03/2022) -we discussed the difficulty in ascertaining his disease status; no clear signs on current scans of untreated disease -however, counseled him he is at risk of subsequent recurrence and/or distant progression -will repeat NGS testing on resection specimen (previously sent on biopsy) to help guide subsequent systemic therapy options  Expand All Collapse All  Hematology and Oncology Follow Up Visit   Patrick Adams [id number] [birth year] 77 y.o. 01/19/2023     Principle Diagnosis:  Metastatic non-small cell lung cancer-adenocarcinoma. --- NO actionable mutations   Current Therapy:        Xgeva 120 mg subcu every 3 months-next dose in 03/2023                                      Interim History:  Mr. Caban is back for a follow-up.  We actually did do a PET scan on him.  This was done on him 12/25/2022.  The PET scan did show that there was a new hypermetabolic lesion in the right upper lobe.  It measures 8 x 12 mm.  There is some hypermetabolic mediastinal right hilar lymph nodes.  Again, it is hard to say if these are truly malignant.  He might need to have a bronchoscopy to find out.   I think that he would be a  candidate for radiotherapy.  I think SBRT would not be a bad idea for this right upper lobe lesion.   He is still smoking.  He is trying to cut back.  He is trying to stop if possible.   He has had no issues with bleeding.  There is no cough.  He has had no mopped assist.   He has had no issues with bowels or bladder.   He is mostly bothered by neuropathic pain where he had his thoracoscopy.  This is over in the right lateral chest wall.  He has been on different medications.  He has been tried on a Lidoderm patch.  Nothing seems to help.  When he does do it take Tylenol and Advil.  I do not think this is a bad idea.  I told him that he MUST take the Advil with food.   Overall, I would have said that his performance status is probably ECOG 1.           Pain issues, if any:  {:18581} {PAIN DESCRIPTION:21022940}  SAFETY ISSUES: Prior radiation? {:18581} Pacemaker/ICD? {:18581} Possible current pregnancy? no Is the patient on methotrexate? {:18581}  Current Complaints / other details:  ***     ***

## 2023-01-27 NOTE — Progress Notes (Signed)
Radiation Oncology         [phone] ________________________________  Initial Outpatient Consultation  Name: Patrick Adams MRN: [id number]  Date: 01/28/2023  DOB: [birth year]  WG:NFAOZH, Kelby Aline, MD  Josph Macho, MD   REFERRING PHYSICIAN: Josph Macho, MD  DIAGNOSIS: {There were no encounter diagnoses. (Refresh or delete this SmartLink)}  Metastatic non-small cell lung cancer-adenocarcinoma diagnosed in 2021, s/p lobectomy. Diagnosed with osseous metastatic disease to the right 8th and 10th ribs in December 2023, s/p radiation therapy. Now with a new hypermetabolic nodule in the posteromedial RUL concerning for disease recurrence (September 2024).   HISTORY OF PRESENT ILLNESS::Patrick Adams is a 77 y.o. male who is accompanied by ***. he is seen as a courtesy of Dr. Myna Hidalgo for an opinion concerning radiation therapy as part of management for his recently diagnosed RUL nodule concerning for right lung cancer recurrence.   He was initially diagnosed with NSCLC of the right lower lung in 2021. He underwent a right lower lobectomy with mediastinal lymph node evaluation under the care of Dr. Althea Grimmer at [place] on 08/12/2019 which showed adenocarcinoma measuring 3.2 cm in the greatest extent of the tumor; positive for visceral pleural invasion; all margins negative; nodal status of 4/4 lymph nodes negative for malignancy. He was evaluated by medical oncology and [facility] but elected to forego adjuvant chemotherapy and proceed with surveillance.   He remained without evidence of disease recurrence until a  restaging CT scan performed on 01/29/2022 showed no definitive evidence of recurrent malignancy. However,  hypermetabolic uptake in the right posterior eighth and 10th ribs was demonstrated.   An MRI of the thoracic spine was subsequently performed on 03/10/22 which showed enhancing osseous lesions in the posterior aspect of the right eighth and 10th rib  with extraosseous extension into the pleural space.   He was accordingly referred to Dr. Bluford Kaufmann at [facility] radiation oncology and underwent palliative radiation to the right 8th and 10th ribs which he completed in mid January (further detailed below).   Restaging CT CAP on 05/01/22, s/p radiation therapy, showed: increasing soft tissue adjacent to the right lower lobe suture line, possibly due to recurrent disease; enhancing lesions involving the right posterior pleura, increasing enhancing soft tissue between the right 10th and 11th ribs (when compared to imaging from 05/16/21); and an increase in size of a nonspecific subpleural nodule in the posterior medial right upper lobe. CT otherwise showed no evidence of metastatic disease in the abdomen or pelvis.   To allow him to receive oncologic care closer to his home in [place], the patient was referred to and established care with Dr. Myna Hidalgo on 06/12/22. He was agreeable to try Xgeva at that time.   Prior to starting treatment and for restaging purposes, a PET scan was performed on 08/06/22 which showed: a hypermetabolic patchy opacity along the posterior right hemithorax favoring radiation changes; and several indeterminate small indeterminate mediastinal and right hilar nodes worrisome for nodal metastases. PET otherwise showed no evidence of metastatic disease in the abdomen or pelvis.   For an unknown reason, the patient did not follow-up with Dr. Myna Hidalgo for an interval of time to start treatment. He eventually returned to Dr. Myna Hidalgo on 12/04/22 with c/o right sided lateral rib cage pain x 1 month and began Xgeva on that date.   He also presented for a restaging PET scan on 12/25/22 which showed: a new hypermetabolic nodule in the posteromedial RUL concerning for disease  recurrence, and similar mild hypermetabolism within mediastinal and right hilar lymph nodes. Other findings included a trace right pleural effusion.   For the new RUL  lesion, Dr. Myna Hidalgo has recommended radiation therapy which we will discuss in detail today. Dr. Myna Hidalgo also notes that he may need a bronchoscopy to see if the mildly hypermetabolic mediastinal nodes are positive.  Of note: He also has a history of hereditary hemochromatosis diagnosed in 1998 by Dr. Randa Evens in [place]. He previously underwent a liver biopsy which confirmed iron overload secondary to hereditary hemochromatosis. Per record review, he was noted by another provider to have undergone serial phlebotomy. He was however lost to follow up in 2008.  Of additional note: He also has a history notable for TAAA s/p repair with stent graft on 02/06/20 performed at [facility].   PREVIOUS RADIATION THERAPY: Yes   Dr. Bluford Kaufmann - [facility]  Diagnosis: osseous metastatic disease from a right lung cancer primary Radiation treatment dates: 04/07/22 through 04/17/22 Site/Dose: Right 8th and 10th ribs treated with 30 Gy delivered in 5 Fx at 6 Gy/Fx Technique: 3D  PAST MEDICAL HISTORY:  Past Medical History:  Diagnosis Date   CAD S/P PCI 01/2018   a) 01/31/2018: p-mLAD 60-100% (DES PCI with 2.75 mm x 38 mm Synergy DES - 4.0-3.6.3.0 mm tapered post-dilation); b) p-mRCA 55%-85%-40% - Synergy DES 3.5 mm x 32 mm -> 4.0 mm; & RPAV 90% --> Synergy DES 2.5 mm x 12 mm -> 3.0 mm;; c) stents patent on re-look Cath 03/19/2018   Diverticulitis    Erectile dysfunction    Hemochromatosis    Hyperlipidemia    Hypertension    Ischemic cardiomyopathy 01/31/2018   Initial post STEMI EF by echo 40 to 45% --> by cath 1 month later LV gram showed EF 55 to 60%. -- most Recent Echo: EF 45-50%. Mid-Apical Anterior/Anterospetal HKc.a LAD infarct.   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   Non-small cell lung cancer, right (HCC) 05/2019   Proven  by CT-guided biopsy-stage I   PAD (peripheral artery disease) (HCC)    Prostate nodule    SBO (small bowel obstruction) (HCC) 05/2019   Mostly postop ileus   ST elevation (STEMI) myocardial infarction involving left anterior descending coronary artery (HCC) 01/31/2018   Found to have pLAD 60%-mLAD 100% (DES PCI), distal LAD diffuse 60-70% (med Rx), OM1 65%; also signifiicant p-m RCA & RPAV disease  --> Initial EF ~45% with elevated LVEDP   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 Marijo File, annual scans -> until summer/fall 2021 -> noted progression to 6.2 mm aneurysm --> 11/25/221 [facility]- Dr. 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 Marijo File, annual scans   CARDIAC EVENT MONITOR  10/2018   Baseline sinus rhythm with 1 degree AVB.  PVCs noted.  Average heart rate 73 bpm.  3 critical: 1 serious and 8 stable events noted: Critical-4.0-second pauses x3, serious 1 run 6 beat PVCs (NSVT), 3 pauses of 3.2 to 3.5 seconds.  Pauses do  have P waves that appear to be blocked on some occasions.  Both junctional escape and normal sinus beats after pauses.   COLONOSCOPY  2016   CORONARY STENT INTERVENTION N/A 02/03/2018   Procedure: CORONARY STENT INTERVENTION;  Surgeon: Lyn Records, MD;  Location: [facility] INVASIVE CV LAB;;   staged PCI p-mRCA (Synergy DES 3.5 mm x 32 mm--4.0 mm covering entire diseased segment from  proximal 55 through the 85 and 40% lesions in the mid vessel), RPAV (Synergy DES 2.5 mm x 12 mm--3.0 mm)   CORONARY/GRAFT ACUTE MI REVASCULARIZATION N/A 01/31/2018   Procedure: Coronary/Graft Acute MI Revascularization;  Surgeon: Marykay Lex, MD;  Location: [facility] INVASIVE  CV LAB;; ANTERIOR-INFERIOR STEMI: pLAD 60% - mLAD 100% (PCI-Synergy DES 2.75 mm at 38 mm--4.1-3.6-3.0 mm tapered post dilation)   ENDOVASCULAR RIGHT INTERNAL ILIAC ANEURYSM REPAIR Right 02/06/2020   [facility]; Dr. Pattricia Boss - Stent Graft Placed   LEFT HEART CATH AND CORONARY ANGIOGRAPHY N/A 01/31/2018   Procedure: LEFT HEART CATH AND CORONARY ANGIOGRAPHY;  Surgeon: Marykay Lex, MD;  Location: [facility] INVASIVE CV LAB;;; (anterior inferior STEMI) -> pLAD 60% - mLAD 100% (DES PCI); p-m RCA 55%, mRCA 85%, mRCA 40% (diffuse segmental disease), RPAV 90% (ulcerated), OM1 65%.  EF 35-45%, moderately elevated LVEDP --acute combined systolic and diastolic heart failure, ischemic cardiomyopathy   LEFT HEART CATH AND CORONARY ANGIOGRAPHY N/A 02/03/2018   Procedure: LEFT HEART CATH AND CORONARY ANGIOGRAPHY;  Surgeon: Lyn Records, MD;  Location: [facility] INVASIVE CV LAB;  Service: Cardiovascular;  Laterality: N/A;   LEFT HEART CATH AND CORONARY ANGIOGRAPHY N/A 03/19/2018   Procedure: LEFT HEART CATH AND CORONARY ANGIOGRAPHY;  Surgeon: [place], Peter M, MD;  Location: [facility] INVASIVE CV LAB;  Service: Cardiovascular;  Patent stent in p-mLAD, patent p-m RCA stent and RPAV stent.  Distal LAD 70%, OM1 65%.  EF 55-60%.   TEVAR  2/8 & 05/12/2019   [facility]-Dr. Pattricia Boss: (for type I a endoleak of FEVAR from 01/2019)  05/02/2019: TEVAR - COOK Alpha 34 x 34 x 209 (RFA); 05/12/19 - prox cuff extension TEVAR (Gore 37 mm x 15 mm, LCFA).   THORACOABDOMINAL AORTIC ANEURYSM REPAIR  02/23/2019   [facility]-Dr. Pattricia Boss: admitted for lumbar drain insertion followed by aneurysmal repair 4V FEVAR (CA, SMA, RRA, LRA - Cook Branced Device - Bilateral Femoral Access & L Brachial Cutdown).   TRANSTHORACIC ECHOCARDIOGRAM  02/01/2018   (Insetting of anterior-inferior STEMI) --> moderate LVH.  EF 40 to 45%.  Anterior, anteroseptal and apical severe hypokinesis to akinesis.  Indeterminate LV filling pressures.  Aortic sclerosis, no stenosis.   TRANSTHORACIC ECHOCARDIOGRAM   01/17/2019   [facility]: 01/17/2019: EF 55%-normal wall motion..  Mild RV dilation.  Aortic sclerosis but no stenosis   TRANSTHORACIC ECHOCARDIOGRAM  07/2019   EF 45-50%.Moderate HK of mid-apical anterior-anteroseptal wall (c/w LAD MI). Mild-Mod AoV sclerosis w/o Stenosis. Marland Kitchen    FAMILY HISTORY:  Family History  Problem Relation Age of Onset   Colonic polyp Mother    Heart disease Father    Liver disease Father     SOCIAL HISTORY:  Social History   Tobacco Use   Smoking status: Every Day    Current packs/day: 1.00    Types: Cigarettes   Smokeless tobacco: Never  Vaping Use   Vaping status: Never Used  Substance Use Topics   Alcohol use: No   Drug use: No    ALLERGIES:  Allergies  Allergen Reactions   Brilinta [Ticagrelor] Shortness Of Breath  Patient was taking 60 mg  Twice a day - changed to Clopidogrel 75 mg  03/02/19   Ciprofloxacin Anaphylaxis   Statins Other (See Comments)    Joint pain.Myalgias -- HAS TRIED LIPITOR, ZOCOR & CRESTOR    MEDICATIONS:  Current Outpatient Medications  Medication Sig Dispense Refill   acetaminophen (TYLENOL) 500 MG tablet Take 1,000 mg by mouth every 6 (six) hours as needed for moderate pain or headache.     cholecalciferol (VITAMIN D3) 25 MCG (1000 UT) tablet Take 1,000 Units by mouth daily.     clopidogrel (PLAVIX) 75 MG tablet TAKE 1 TABLET BY MOUTH EVERY DAY 90 tablet 0   dronabinol (MARINOL) 5 MG capsule Take 1 capsule (5 mg total) by mouth 2 (two) times daily before a meal. (Patient not taking: Reported on 01/19/2023) 60 capsule 0   ibuprofen (ADVIL) 200 MG tablet Take 200 mg by mouth 2 (two) times daily. As needed     losartan (COZAAR) 100 MG tablet TAKE 1 TABLET BY MOUTH EVERY DAY 90 tablet 0   nitroGLYCERIN (NITROSTAT) 0.4 MG SL tablet Place 1 tablet (0.4 mg total) under the tongue every 5 (five) minutes as needed for chest pain. 100 tablet 3   polyethylene glycol (MIRALAX / GLYCOLAX) 17 g packet Take 17 g by mouth daily  as needed (Constipation). 14 each 0   saccharomyces boulardii (FLORASTOR) 250 MG capsule Take 250 mg by mouth daily.     sildenafil (VIAGRA) 100 MG tablet Take 1 tablet (100 mg total) by mouth daily as needed for erectile dysfunction. (Patient not taking: Reported on 01/19/2023) 10 tablet 6   No current facility-administered medications for this encounter.    REVIEW OF SYSTEMS:  A 10+ POINT REVIEW OF SYSTEMS WAS OBTAINED including neurology, dermatology, psychiatry, cardiac, respiratory, lymph, extremities, GI, GU, musculoskeletal, constitutional, reproductive, HEENT. ***   PHYSICAL EXAM:  vitals were not taken for this visit.   General: Alert and oriented, in no acute distress HEENT: Head is normocephalic. Extraocular movements are intact. Oropharynx is clear. Neck: Neck is supple, no palpable cervical or supraclavicular lymphadenopathy. Heart: Regular in rate and rhythm with no murmurs, rubs, or gallops. Chest: Clear to auscultation bilaterally, with no rhonchi, wheezes, or rales. Abdomen: Soft, nontender, nondistended, with no rigidity or guarding. Extremities: No cyanosis or edema. Lymphatics: see Neck Exam Skin: No concerning lesions. Musculoskeletal: symmetric strength and muscle tone throughout. Neurologic: Cranial nerves II through XII are grossly intact. No obvious focalities. Speech is fluent. Coordination is intact. Psychiatric: Judgment and insight are intact. Affect is appropriate. ***  ECOG = ***  0 - Asymptomatic (Fully active, able to carry on all predisease activities without restriction)  1 - Symptomatic but completely ambulatory (Restricted in physically strenuous activity but ambulatory and able to carry out work of a light or sedentary nature. For example, light housework, office work)  2 - Symptomatic, <50% in bed during the day (Ambulatory and capable of all self care but unable to carry out any work activities. Up and about more than 50% of waking hours)  3 -  Symptomatic, >50% in bed, but not bedbound (Capable of only limited self-care, confined to bed or chair 50% or more of waking hours)  4 - Bedbound (Completely disabled. Cannot carry on any self-care. Totally confined to bed or chair)  5 - Death   Santiago Glad MM, Creech RH, Tormey DC, et al. [phone]). "Toxicity and response criteria of the [facility] Group". Am. Evlyn Clines. Oncol. 5 (6):  161-09  LABORATORY DATA:  Lab Results  Component Value Date   WBC 5.8 01/19/2023   HGB 14.1 01/19/2023   HCT 41.6 01/19/2023   MCV 91.6 01/19/2023   PLT 202 01/19/2023   NEUTROABS 4.5 01/19/2023   Lab Results  Component Value Date   NA 138 01/19/2023   K 4.7 01/19/2023   CL 102 01/19/2023   CO2 28 01/19/2023   GLUCOSE 95 01/19/2023   BUN 23 01/19/2023   CREATININE 1.37 (H) 01/19/2023   CALCIUM 9.7 01/19/2023      RADIOGRAPHY: No results found.    IMPRESSION: Metastatic non-small cell lung cancer-adenocarcinoma diagnosed in 2021, s/p lobectomy. Diagnosed with osseous metastatic disease to the right 8th and 10th ribs in December 2023, s/p radiation therapy. Now with a new hypermetabolic nodule in the posteromedial RUL concerning for disease recurrence (September 2024).   ***  Today, I talked to the patient and family about the findings and work-up thus far.  We discussed the natural history of *** and general treatment, highlighting the role of radiotherapy in the management.  We discussed the available radiation techniques, and focused on the details of logistics and delivery.  We reviewed the anticipated acute and late sequelae associated with radiation in this setting.  The patient was encouraged to ask questions that I answered to the best of my ability. *** A patient consent form was discussed and signed.  We retained a copy for our records.  The patient would like to proceed with radiation and will be scheduled for CT simulation.  PLAN: ***    *** minutes of total time was spent for  this patient encounter, including preparation, face-to-face counseling with the patient and coordination of care, physical exam, and documentation of the encounter.   ------------------------------------------------  Billie Lade, PhD, MD  This document serves as a record of services personally performed by Antony Blackbird, MD. It was created on his behalf by Neena Rhymes, a trained medical scribe. The creation of this record is based on the scribe's personal observations and the provider's statements to them. This document has been checked and approved by the attending provider.

## 2023-01-28 ENCOUNTER — Encounter: Payer: Self-pay | Admitting: Radiation Oncology

## 2023-01-28 ENCOUNTER — Ambulatory Visit
Admission: RE | Admit: 2023-01-28 | Discharge: 2023-01-28 | Disposition: A | Discharge status: Home / Self Care | Payer: Medicare HMO | Source: Ambulatory Visit | Attending: Radiation Oncology | Admitting: Radiation Oncology

## 2023-01-28 VITALS — BP 137/69 | HR 71 | Temp 97.8°F | Resp 18 | Ht 72.0 in | Wt 150.0 lb

## 2023-01-28 DIAGNOSIS — Z87442 Personal history of urinary calculi: Secondary | ICD-10-CM | POA: Diagnosis not present

## 2023-01-28 DIAGNOSIS — I252 Old myocardial infarction: Secondary | ICD-10-CM | POA: Diagnosis not present

## 2023-01-28 DIAGNOSIS — Z7902 Long term (current) use of antithrombotics/antiplatelets: Secondary | ICD-10-CM | POA: Insufficient documentation

## 2023-01-28 DIAGNOSIS — E785 Hyperlipidemia, unspecified: Secondary | ICD-10-CM | POA: Insufficient documentation

## 2023-01-28 DIAGNOSIS — I739 Peripheral vascular disease, unspecified: Secondary | ICD-10-CM | POA: Insufficient documentation

## 2023-01-28 DIAGNOSIS — I10 Essential (primary) hypertension: Secondary | ICD-10-CM | POA: Insufficient documentation

## 2023-01-28 DIAGNOSIS — Z791 Long term (current) use of non-steroidal anti-inflammatories (NSAID): Secondary | ICD-10-CM | POA: Diagnosis not present

## 2023-01-28 DIAGNOSIS — I255 Ischemic cardiomyopathy: Secondary | ICD-10-CM | POA: Diagnosis not present

## 2023-01-28 DIAGNOSIS — F17210 Nicotine dependence, cigarettes, uncomplicated: Secondary | ICD-10-CM | POA: Insufficient documentation

## 2023-01-28 DIAGNOSIS — I2510 Atherosclerotic heart disease of native coronary artery without angina pectoris: Secondary | ICD-10-CM | POA: Insufficient documentation

## 2023-01-28 DIAGNOSIS — Z923 Personal history of irradiation: Secondary | ICD-10-CM | POA: Diagnosis not present

## 2023-01-28 DIAGNOSIS — Z79899 Other long term (current) drug therapy: Secondary | ICD-10-CM | POA: Insufficient documentation

## 2023-01-28 DIAGNOSIS — C3411 Malignant neoplasm of upper lobe, right bronchus or lung: Secondary | ICD-10-CM | POA: Insufficient documentation

## 2023-01-28 DIAGNOSIS — C7A090 Malignant carcinoid tumor of the bronchus and lung: Secondary | ICD-10-CM

## 2023-01-28 DIAGNOSIS — C3490 Malignant neoplasm of unspecified part of unspecified bronchus or lung: Secondary | ICD-10-CM

## 2023-01-28 DIAGNOSIS — R918 Other nonspecific abnormal finding of lung field: Secondary | ICD-10-CM | POA: Insufficient documentation

## 2023-01-28 DIAGNOSIS — N529 Male erectile dysfunction, unspecified: Secondary | ICD-10-CM | POA: Diagnosis not present

## 2023-01-28 DIAGNOSIS — C7951 Secondary malignant neoplasm of bone: Secondary | ICD-10-CM | POA: Diagnosis not present

## 2023-01-28 DIAGNOSIS — J90 Pleural effusion, not elsewhere classified: Secondary | ICD-10-CM | POA: Diagnosis not present

## 2023-02-03 ENCOUNTER — Ambulatory Visit
Admission: RE | Admit: 2023-02-03 | Discharge: 2023-02-03 | Disposition: A | Discharge status: Home / Self Care | Payer: Medicare HMO | Source: Ambulatory Visit | Attending: Radiation Oncology | Admitting: Radiation Oncology

## 2023-02-03 DIAGNOSIS — C3411 Malignant neoplasm of upper lobe, right bronchus or lung: Secondary | ICD-10-CM | POA: Insufficient documentation

## 2023-02-03 DIAGNOSIS — C7951 Secondary malignant neoplasm of bone: Secondary | ICD-10-CM | POA: Insufficient documentation

## 2023-02-03 DIAGNOSIS — Z510 Encounter for antineoplastic radiation therapy: Secondary | ICD-10-CM | POA: Insufficient documentation

## 2023-02-07 ENCOUNTER — Other Ambulatory Visit: Payer: Self-pay | Admitting: Cardiology

## 2023-02-10 DIAGNOSIS — Z510 Encounter for antineoplastic radiation therapy: Secondary | ICD-10-CM | POA: Diagnosis not present

## 2023-02-17 ENCOUNTER — Ambulatory Visit: Payer: Medicare HMO | Admitting: Radiation Oncology

## 2023-02-18 ENCOUNTER — Ambulatory Visit
Admission: RE | Admit: 2023-02-18 | Discharge: 2023-02-18 | Disposition: A | Discharge status: Home / Self Care | Payer: Medicare HMO | Source: Ambulatory Visit | Attending: Radiation Oncology | Admitting: Radiation Oncology

## 2023-02-18 ENCOUNTER — Other Ambulatory Visit: Payer: Self-pay

## 2023-02-18 ENCOUNTER — Ambulatory Visit: Payer: Medicare HMO | Admitting: Radiation Oncology

## 2023-02-18 DIAGNOSIS — Z510 Encounter for antineoplastic radiation therapy: Secondary | ICD-10-CM | POA: Diagnosis not present

## 2023-02-18 DIAGNOSIS — C3490 Malignant neoplasm of unspecified part of unspecified bronchus or lung: Secondary | ICD-10-CM

## 2023-02-18 LAB — RAD ONC ARIA SESSION SUMMARY
Course Elapsed Days: 0
Plan Fractions Treated to Date: 1
Plan Prescribed Dose Per Fraction: 3 Gy
Plan Total Fractions Prescribed: 10
Plan Total Prescribed Dose: 30 Gy
Reference Point Dosage Given to Date: 3 Gy
Reference Point Session Dosage Given: 3 Gy
Session Number: 1

## 2023-02-19 ENCOUNTER — Ambulatory Visit: Payer: Medicare HMO

## 2023-02-19 ENCOUNTER — Ambulatory Visit: Payer: Medicare HMO | Admitting: Radiation Oncology

## 2023-02-20 ENCOUNTER — Ambulatory Visit
Admission: RE | Admit: 2023-02-20 | Discharge: 2023-02-20 | Disposition: A | Discharge status: Home / Self Care | Payer: Medicare HMO | Source: Ambulatory Visit | Attending: Radiation Oncology | Admitting: Radiation Oncology

## 2023-02-20 ENCOUNTER — Other Ambulatory Visit: Payer: Self-pay

## 2023-02-20 ENCOUNTER — Ambulatory Visit: Payer: Medicare HMO | Admitting: Radiation Oncology

## 2023-02-20 DIAGNOSIS — Z510 Encounter for antineoplastic radiation therapy: Secondary | ICD-10-CM | POA: Diagnosis not present

## 2023-02-20 LAB — RAD ONC ARIA SESSION SUMMARY
Course Elapsed Days: 2
Plan Fractions Treated to Date: 2
Plan Prescribed Dose Per Fraction: 3 Gy
Plan Total Fractions Prescribed: 10
Plan Total Prescribed Dose: 30 Gy
Reference Point Dosage Given to Date: 6 Gy
Reference Point Session Dosage Given: 3 Gy
Session Number: 2

## 2023-02-22 NOTE — Progress Notes (Unsigned)
Cardiology Office Note:  .   Date:  02/25/2023  ID:  Patrick Adams, DOB 11-28-1945, MRN [id number] PCP: Langley Gauss, MD  [facility] Providers Cardiologist:  Bryan Lemma, MD }   History of Present Illness: .   Patrick Adams is a 77 y.o. male coronary artery disease status post STEMI of the anterior wall in 2019, with cardiac catheterization done a month and a half post MI showing widely patent stents with diffuse distal LAD disease Significant LAD and RCA disease with long stents in the mid RCA and LAD as well as RPDA, extensive distal LAD disease not femoral PCI,    He has ongoing tobacco abuse, ischemic cardiomyopathy with [place] class I-II symptoms as a result of baseline exertional dyspnea, thoracic aortic aneurysm, descending after residual AAA repair at [facility], hyperlipidemia refuses statin therapy or Praluent; hypertension with reduction in losartan to 50 mg on last office visit.  The patient is here for follow-up.   Since being seen last, the patient has been diagnosed with right upper lobe recurrence of lung cancer.  He had been diagnosed about 4 years ago with lung cancer in the lower lobe and had a lobectomy on the right.  Within 2 years the patient was diagnosed with bone cancer in the ribs on the right which was also treated.  1 month ago he was diagnosed with right upper lobe lung cancer and is now undergoing radiation therapy through oncology at [facility].  He is also being followed at [facility] and was found to have an endoleak in the distal iliac stent and is to follow-up with them in January.  He denies significant cardiac issues.  No palpitations, chest discomfort, breathing status can be tenuous at times but not to the point where he is unable to do daily activities.  His main complaint is pain on the right from radiation for which she is taking Advil and Tylenol.  ROS: As above otherwise negative  Studies Reviewed: Marland Kitchen   EKG  Interpretation Date/Time:  Wednesday February 25 2023 09:10:38 EST Ventricular Rate:  70 PR Interval:  194 QRS Duration:  78 QT Interval:  390 QTC Calculation: 421 R Axis:   34  Text Interpretation: Normal sinus rhythm Nonspecific T wave abnormality When compared with ECG of 31-Dec-2021 13:48, PREVIOUS ECG IS PRESENT Confirmed by Joni Reining [phone]) on 02/25/2023 10:01:00 AM  [facility] Paravascular abdominal ultrasound,02/19/2023 1. Postoperative changes from open abdominal aortobiiliac graft and fenestrated thoracoabdominal aortic endograft with spanning from zone 4 to the proximal aortobiiliac graft anastomosis. 2. Type Ia endoleak again noted along the proximal aspect of the endograft, slightly decreased in prominence compared to the prior study, although this may be related to differences in contrast timing. Of note, this endoleak is incompletely evaluated on today's study as it was excluded from the field-of-view on the venous delay. 3. The proximal descending thoracic aorta has slightly increased in diameter, now measuring up to 4.6 cm, previously measuring up to 4.3 cm. 4. The superior component of the excluded bilobed thoracoabdominal aortic aneurysm sac has increased in size, now measuring up to 7.7 cm in greatest transaxial dimension, previously 7.4 cm. 5. 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  EKG Interpretation Date/Time:  Wednesday February 25 2023 09:10:38 EST Ventricular Rate:  70 PR Interval:  194 QRS Duration:  78 QT Interval:  390 QTC Calculation: 421 R Axis:   34  Text Interpretation: Normal sinus rhythm Nonspecific T wave abnormality When compared with ECG of 31-Dec-2021 13:48, PREVIOUS ECG IS PRESENT Confirmed by Joni Reining [phone]) on 02/25/2023 10:01:00  AM    Physical Exam:   VS:  BP 110/70 (BP Location: Left Arm, Patient Position: Sitting, Cuff Size: Normal)   Pulse 70   Ht 6' (1.829 m)   Wt 153 lb 3.2 oz (69.5 kg)   SpO2 97%   BMI 20.78 kg/m    Wt Readings from Last 3 Encounters:  02/25/23 153 lb 3.2 oz (69.5 kg)  01/28/23 150 lb (68 kg)  01/19/23 149 lb (67.6 kg)    GEN: Well nourished, well developed in no acute distress NECK: No JVD; No carotid bruits CARDIAC: RRR, soft systolic  murmurs, rubs, gallops RESPIRATORY:  Clear to auscultation without rales, wheezing or rhonchi Absent RLL/ ABDOMEN: Soft, non-tender, non-distended EXTREMITIES:  No edema; No deformity   ASSESSMENT AND PLAN: .    Coronary artery disease: Status post drug-eluting stents to the LAD and RCA, with long stents in the mid RCA and LAD as well as RPDA, with extensive distal LAD disease.  He remains on clopidogrel and is without complaints of recurrent chest pain of cardiac etiology.    2.  PAD: Currently being followed by [facility] with stents to iliac artery and biliary artery with some endoleak he noted in report above.  He will continue on Plavix unless recommended to change by vascular surgeon.  3.  Hypertension: Blood pressure is low normal.  Will not make any changes he should continue to take the losartan as directed.  If he does become hypotensive would titrate if necessary to 50 mg daily.  4.  Recurrent lung cancer: New right upper lobe nodule.  Now being treated with radiation therapy.  History of adenocarcinoma of the right lower lung with lobectomy in 2021.   5.  Ongoing tobacco abuse: He states that he has gone down from 1-1 1/2 ppd, to a half a pack a day and he is trying to quit.  I have encouraged him to do so especially in light of recurrence of lung cancer.         Signed, Bettey Mare. Liborio Nixon, ANP, AACC

## 2023-02-23 ENCOUNTER — Other Ambulatory Visit: Payer: Self-pay

## 2023-02-23 ENCOUNTER — Ambulatory Visit
Admission: RE | Admit: 2023-02-23 | Discharge: 2023-02-23 | Disposition: A | Discharge status: Home / Self Care | Payer: Medicare HMO | Source: Ambulatory Visit | Attending: Radiation Oncology | Admitting: Radiation Oncology

## 2023-02-23 DIAGNOSIS — Z510 Encounter for antineoplastic radiation therapy: Secondary | ICD-10-CM | POA: Diagnosis not present

## 2023-02-23 LAB — RAD ONC ARIA SESSION SUMMARY
Course Elapsed Days: 5
Plan Fractions Treated to Date: 3
Plan Prescribed Dose Per Fraction: 3 Gy
Plan Total Fractions Prescribed: 10
Plan Total Prescribed Dose: 30 Gy
Reference Point Dosage Given to Date: 9 Gy
Reference Point Session Dosage Given: 3 Gy
Session Number: 3

## 2023-02-24 ENCOUNTER — Ambulatory Visit
Admission: RE | Admit: 2023-02-24 | Discharge: 2023-02-24 | Disposition: A | Discharge status: Home / Self Care | Payer: Medicare HMO | Source: Ambulatory Visit | Attending: Radiation Oncology | Admitting: Radiation Oncology

## 2023-02-24 ENCOUNTER — Other Ambulatory Visit: Payer: Self-pay

## 2023-02-24 DIAGNOSIS — Z510 Encounter for antineoplastic radiation therapy: Secondary | ICD-10-CM | POA: Diagnosis not present

## 2023-02-24 LAB — RAD ONC ARIA SESSION SUMMARY
Course Elapsed Days: 6
Plan Fractions Treated to Date: 4
Plan Prescribed Dose Per Fraction: 3 Gy
Plan Total Fractions Prescribed: 10
Plan Total Prescribed Dose: 30 Gy
Reference Point Dosage Given to Date: 12 Gy
Reference Point Session Dosage Given: 3 Gy
Session Number: 4

## 2023-02-25 ENCOUNTER — Encounter: Payer: Self-pay | Admitting: Adult Health

## 2023-02-25 ENCOUNTER — Ambulatory Visit
Admission: RE | Admit: 2023-02-25 | Discharge: 2023-02-25 | Discharge status: Home / Self Care | Payer: Medicare HMO | Source: Ambulatory Visit | Attending: Radiation Oncology | Admitting: Radiation Oncology

## 2023-02-25 ENCOUNTER — Ambulatory Visit: Payer: Medicare HMO | Attending: Adult Health | Admitting: Adult Health

## 2023-02-25 ENCOUNTER — Other Ambulatory Visit: Payer: Self-pay

## 2023-02-25 VITALS — BP 110/70 | HR 70 | Ht 72.0 in | Wt 153.2 lb

## 2023-02-25 DIAGNOSIS — I10 Essential (primary) hypertension: Secondary | ICD-10-CM

## 2023-02-25 DIAGNOSIS — Z510 Encounter for antineoplastic radiation therapy: Secondary | ICD-10-CM | POA: Diagnosis not present

## 2023-02-25 LAB — RAD ONC ARIA SESSION SUMMARY
Course Elapsed Days: 7
Plan Fractions Treated to Date: 5
Plan Prescribed Dose Per Fraction: 3 Gy
Plan Total Fractions Prescribed: 10
Plan Total Prescribed Dose: 30 Gy
Reference Point Dosage Given to Date: 15 Gy
Reference Point Session Dosage Given: 3 Gy
Session Number: 5

## 2023-02-25 MED ORDER — CLOPIDOGREL BISULFATE 75 MG PO TABS: 75.0000 mg | ORAL_TABLET | Freq: Every day | ORAL | 3 refills | Status: DC

## 2023-02-25 MED ORDER — LOSARTAN POTASSIUM 100 MG PO TABS: 100.0000 mg | ORAL_TABLET | Freq: Every day | ORAL | 3 refills | Status: DC

## 2023-02-25 NOTE — Patient Instructions (Signed)
Medication Instructions:  No Changes *If you need a refill on your cardiac medications before your next appointment, please call your pharmacy*   Lab Work: No Labs If you have labs (blood work) drawn today and your tests are completely normal, you will receive your results only by: MyChart Message (if you have MyChart) OR A paper copy in the mail If you have any lab test that is abnormal or we need to change your treatment, we will call you to review the results.   Testing/Procedures: No Testing   Follow-Up: At [facility], you and your health needs are our priority.  As part of our continuing mission to provide you with exceptional heart care, we have created designated Provider Care Teams.  These Care Teams include your primary Cardiologist (physician) and Advanced Practice Providers (APPs -  Physician Assistants and Nurse Practitioners) who all work together to provide you with the care you need, when you need it.  We recommend signing up for the patient portal called "MyChart".  Sign up information is provided on this After Visit Summary.  MyChart is used to connect with patients for Virtual Visits (Telemedicine).  Patients are able to view lab/test results, encounter notes, upcoming appointments, etc.  Non-urgent messages can be sent to your provider as well.   To learn more about what you can do with MyChart, go to [email or web address].    Your next appointment:   Keep Scheduled Appointment  Provider:   Bryan Lemma, MD

## 2023-03-02 ENCOUNTER — Other Ambulatory Visit: Payer: Self-pay

## 2023-03-02 ENCOUNTER — Ambulatory Visit
Admission: RE | Admit: 2023-03-02 | Discharge: 2023-03-02 | Disposition: A | Discharge status: Home / Self Care | Payer: Medicare HMO | Source: Ambulatory Visit | Attending: Radiation Oncology | Admitting: Radiation Oncology

## 2023-03-02 DIAGNOSIS — C3411 Malignant neoplasm of upper lobe, right bronchus or lung: Secondary | ICD-10-CM | POA: Diagnosis not present

## 2023-03-02 DIAGNOSIS — Z510 Encounter for antineoplastic radiation therapy: Secondary | ICD-10-CM | POA: Diagnosis present

## 2023-03-02 DIAGNOSIS — C7951 Secondary malignant neoplasm of bone: Secondary | ICD-10-CM | POA: Insufficient documentation

## 2023-03-02 LAB — RAD ONC ARIA SESSION SUMMARY
Course Elapsed Days: 12
Plan Fractions Treated to Date: 6
Plan Prescribed Dose Per Fraction: 3 Gy
Plan Total Fractions Prescribed: 10
Plan Total Prescribed Dose: 30 Gy
Reference Point Dosage Given to Date: 18 Gy
Reference Point Session Dosage Given: 3 Gy
Session Number: 6

## 2023-03-03 ENCOUNTER — Other Ambulatory Visit: Payer: Self-pay

## 2023-03-03 ENCOUNTER — Ambulatory Visit
Admission: RE | Admit: 2023-03-03 | Discharge: 2023-03-03 | Disposition: A | Discharge status: Home / Self Care | Payer: Medicare HMO | Source: Ambulatory Visit | Attending: Radiation Oncology | Admitting: Radiation Oncology

## 2023-03-03 DIAGNOSIS — Z510 Encounter for antineoplastic radiation therapy: Secondary | ICD-10-CM | POA: Diagnosis not present

## 2023-03-03 LAB — RAD ONC ARIA SESSION SUMMARY
Course Elapsed Days: 13
Plan Fractions Treated to Date: 7
Plan Prescribed Dose Per Fraction: 3 Gy
Plan Total Fractions Prescribed: 10
Plan Total Prescribed Dose: 30 Gy
Reference Point Dosage Given to Date: 21 Gy
Reference Point Session Dosage Given: 3 Gy
Session Number: 7

## 2023-03-04 ENCOUNTER — Other Ambulatory Visit: Payer: Self-pay

## 2023-03-04 ENCOUNTER — Ambulatory Visit
Admission: RE | Admit: 2023-03-04 | Discharge: 2023-03-04 | Disposition: A | Discharge status: Home / Self Care | Payer: Medicare HMO | Source: Ambulatory Visit | Attending: Radiation Oncology | Admitting: Radiation Oncology

## 2023-03-04 DIAGNOSIS — Z510 Encounter for antineoplastic radiation therapy: Secondary | ICD-10-CM | POA: Diagnosis not present

## 2023-03-04 LAB — RAD ONC ARIA SESSION SUMMARY
Course Elapsed Days: 14
Plan Fractions Treated to Date: 8
Plan Prescribed Dose Per Fraction: 3 Gy
Plan Total Fractions Prescribed: 10
Plan Total Prescribed Dose: 30 Gy
Reference Point Dosage Given to Date: 24 Gy
Reference Point Session Dosage Given: 3 Gy
Session Number: 8

## 2023-03-05 ENCOUNTER — Ambulatory Visit
Admission: RE | Admit: 2023-03-05 | Discharge: 2023-03-05 | Disposition: A | Discharge status: Home / Self Care | Payer: Medicare HMO | Source: Ambulatory Visit | Attending: Radiation Oncology | Admitting: Radiation Oncology

## 2023-03-05 ENCOUNTER — Ambulatory Visit: Payer: Medicare HMO | Admitting: Hematology & Oncology

## 2023-03-05 ENCOUNTER — Other Ambulatory Visit: Payer: Self-pay

## 2023-03-05 ENCOUNTER — Inpatient Hospital Stay: Payer: Medicare HMO

## 2023-03-05 DIAGNOSIS — C7951 Secondary malignant neoplasm of bone: Secondary | ICD-10-CM

## 2023-03-05 DIAGNOSIS — Z510 Encounter for antineoplastic radiation therapy: Secondary | ICD-10-CM | POA: Diagnosis not present

## 2023-03-05 LAB — RAD ONC ARIA SESSION SUMMARY
Course Elapsed Days: 15
Plan Fractions Treated to Date: 9
Plan Prescribed Dose Per Fraction: 3 Gy
Plan Total Fractions Prescribed: 10
Plan Total Prescribed Dose: 30 Gy
Reference Point Dosage Given to Date: 27 Gy
Reference Point Session Dosage Given: 3 Gy
Session Number: 9

## 2023-03-06 ENCOUNTER — Ambulatory Visit: Payer: Medicare HMO

## 2023-03-06 ENCOUNTER — Ambulatory Visit
Admission: RE | Admit: 2023-03-06 | Discharge: 2023-03-06 | Disposition: A | Discharge status: Home / Self Care | Payer: Medicare HMO | Source: Ambulatory Visit | Attending: Radiation Oncology | Admitting: Radiation Oncology

## 2023-03-06 ENCOUNTER — Inpatient Hospital Stay: Payer: Medicare HMO | Admitting: Hematology & Oncology

## 2023-03-06 ENCOUNTER — Inpatient Hospital Stay: Payer: Medicare HMO

## 2023-03-06 ENCOUNTER — Inpatient Hospital Stay: Payer: Medicare HMO | Attending: Hematology & Oncology

## 2023-03-06 ENCOUNTER — Encounter: Payer: Self-pay | Admitting: Hematology & Oncology

## 2023-03-06 ENCOUNTER — Other Ambulatory Visit: Payer: Self-pay

## 2023-03-06 VITALS — BP 122/64 | HR 64 | Temp 97.6°F | Resp 18 | Ht 72.0 in | Wt 154.0 lb

## 2023-03-06 DIAGNOSIS — Z923 Personal history of irradiation: Secondary | ICD-10-CM | POA: Diagnosis not present

## 2023-03-06 DIAGNOSIS — Z510 Encounter for antineoplastic radiation therapy: Secondary | ICD-10-CM | POA: Diagnosis not present

## 2023-03-06 DIAGNOSIS — C3411 Malignant neoplasm of upper lobe, right bronchus or lung: Secondary | ICD-10-CM | POA: Diagnosis present

## 2023-03-06 DIAGNOSIS — C3490 Malignant neoplasm of unspecified part of unspecified bronchus or lung: Secondary | ICD-10-CM | POA: Diagnosis not present

## 2023-03-06 DIAGNOSIS — C7951 Secondary malignant neoplasm of bone: Secondary | ICD-10-CM

## 2023-03-06 LAB — CMP (CANCER CENTER ONLY)
ALT: 8 U/L (ref 0–44)
AST: 10 U/L — ABNORMAL LOW (ref 15–41)
Albumin: 4.1 g/dL (ref 3.5–5.0)
Alkaline Phosphatase: 96 U/L (ref 38–126)
Anion gap: 5 (ref 5–15)
BUN: 26 mg/dL — ABNORMAL HIGH (ref 8–23)
CO2: 30 mmol/L (ref 22–32)
Calcium: 10.4 mg/dL — ABNORMAL HIGH (ref 8.9–10.3)
Chloride: 101 mmol/L (ref 98–111)
Creatinine: 1.25 mg/dL — ABNORMAL HIGH (ref 0.61–1.24)
GFR, Estimated: >60 mL/min (ref >60)
Glucose, Bld: 77 mg/dL (ref 70–99)
Potassium: 5.3 mmol/L — ABNORMAL HIGH (ref 3.5–5.1)
Sodium: 136 mmol/L (ref 135–145)
Total Bilirubin: 0.5 mg/dL (ref <1.2)
Total Protein: 7 g/dL (ref 6.5–8.1)

## 2023-03-06 LAB — RAD ONC ARIA SESSION SUMMARY
Course Elapsed Days: 16
Plan Fractions Treated to Date: 10
Plan Prescribed Dose Per Fraction: 3 Gy
Plan Total Fractions Prescribed: 10
Plan Total Prescribed Dose: 30 Gy
Reference Point Dosage Given to Date: 30 Gy
Reference Point Session Dosage Given: 3 Gy
Session Number: 10

## 2023-03-06 LAB — CBC WITH DIFFERENTIAL (CANCER CENTER ONLY)
Abs Immature Granulocytes: 0.04 10*3/uL (ref 0.00–0.07)
Basophils Absolute: 0 10*3/uL (ref 0.0–0.1)
Basophils Relative: 0 %
Eosinophils Absolute: 0.2 10*3/uL (ref 0.0–0.5)
Eosinophils Relative: 3 %
HCT: 39.9 % (ref 39.0–52.0)
Hemoglobin: 13.9 g/dL (ref 13.0–17.0)
Immature Granulocytes: 1 %
Lymphocytes Relative: 10 %
Lymphs Abs: 0.7 10*3/uL (ref 0.7–4.0)
MCH: 31.7 pg (ref 26.0–34.0)
MCHC: 34.8 g/dL (ref 30.0–36.0)
MCV: 90.9 fL (ref 80.0–100.0)
Monocytes Absolute: 0.5 10*3/uL (ref 0.1–1.0)
Monocytes Relative: 8 %
Neutro Abs: 5.1 10*3/uL (ref 1.7–7.7)
Neutrophils Relative %: 78 %
Platelet Count: 215 10*3/uL (ref 150–400)
RBC: 4.39 MIL/uL (ref 4.22–5.81)
RDW: 11.9 % (ref 11.5–15.5)
WBC Count: 6.6 10*3/uL (ref 4.0–10.5)
nRBC: 0 % (ref 0.0–0.2)

## 2023-03-06 LAB — LACTATE DEHYDROGENASE: LDH: 115 U/L (ref 98–192)

## 2023-03-06 MED ORDER — DENOSUMAB 120 MG/1.7ML ~~LOC~~ SOLN
120.0000 mg | Freq: Once | SUBCUTANEOUS | Status: AC
  Administered 2023-03-06: 120 mg via SUBCUTANEOUS
  Filled 2023-03-06: qty 1.7

## 2023-03-06 NOTE — Progress Notes (Signed)
Hematology and Oncology Follow Up Visit  FLORIS CIENFUEGOS [id number] [birth year] 77 y.o. 03/06/2023   Principle Diagnosis:  Metastatic non-small cell lung cancer-adenocarcinoma. --- NO actionable mutations  Current Therapy:   Xgeva 120 mg subcu every 3 months-next dose in 05/2023 03/2023 XRT to the right upper lobe nodule -definitive treatment on 03/09/2023     Interim History:  Mr. Monjaraz is back for a follow-up.  He is getting radiation therapy.  He is doing pretty well with radiotherapy.  He really has had no problems with swallowing.  I think when radiation first started, he had some issues with dysphagia.  He is eating quite well right now..  He has had no change in bowel or bladder habits.  He still has the chest wall discomfort.  I think this is some totally different.  I think this is probably scar tissue.  He is taking Tylenol and Advil.  He says this helps more than anything for the pain.  I told him was going to have to take the Advil with food and make sure he is well-hydrated.  He has had no fever.  He has had no obvious bleeding.  He has had no headache.  Currently, I would have to say that his performance status is probably ECOG 1.    Medications:  Current Outpatient Medications:    acetaminophen (TYLENOL) 500 MG tablet, Take 1,000 mg by mouth every 6 (six) hours as needed for moderate pain or headache., Disp: , Rfl:    cholecalciferol (VITAMIN D3) 25 MCG (1000 UT) tablet, Take 1,000 Units by mouth daily., Disp: , Rfl:    clopidogrel (PLAVIX) 75 MG tablet, Take 1 tablet (75 mg total) by mouth daily., Disp: 90 tablet, Rfl: 3   dronabinol (MARINOL) 5 MG capsule, Take 1 capsule (5 mg total) by mouth 2 (two) times daily before a meal. (Patient not taking: Reported on 02/25/2023), Disp: 60 capsule, Rfl: 0   ibuprofen (ADVIL) 200 MG tablet, Take 200 mg by mouth 2 (two) times daily. As needed, Disp: , Rfl:    losartan (COZAAR) 100 MG tablet, Take 1 tablet (100 mg total) by mouth  daily., Disp: 90 tablet, Rfl: 3   nitroGLYCERIN (NITROSTAT) 0.4 MG SL tablet, Place 1 tablet (0.4 mg total) under the tongue every 5 (five) minutes as needed for chest pain., Disp: 100 tablet, Rfl: 3   polyethylene glycol (MIRALAX / GLYCOLAX) 17 g packet, Take 17 g by mouth daily as needed (Constipation)., Disp: 14 each, Rfl: 0   saccharomyces boulardii (FLORASTOR) 250 MG capsule, Take 250 mg by mouth daily., Disp: , Rfl:    sildenafil (VIAGRA) 100 MG tablet, Take 1 tablet (100 mg total) by mouth daily as needed for erectile dysfunction. (Patient not taking: Reported on 02/25/2023), Disp: 10 tablet, Rfl: 6  Allergies:  Allergies  Allergen Reactions   Brilinta [Ticagrelor] Shortness Of Breath     Patient was taking 60 mg  Twice a day - changed to Clopidogrel 75 mg  03/02/19   Ciprofloxacin Anaphylaxis   Statins Other (See Comments)    Joint pain.Myalgias -- HAS TRIED LIPITOR, ZOCOR & CRESTOR    Past Medical History, Surgical history, Social history, and Family History were reviewed and updated.  Review of Systems: Review of Systems  Constitutional:  Positive for unexpected weight change.  HENT:  Negative.    Eyes: Negative.   Respiratory: Negative.    Cardiovascular:  Positive for chest pain.  Gastrointestinal: Negative.   Endocrine: Negative.  Genitourinary: Negative.    Musculoskeletal: Negative.   Skin: Negative.   Neurological: Negative.   Hematological: Negative.   Psychiatric/Behavioral: Negative.      Physical Exam:  height is 6' (1.829 m) and weight is 154 lb (69.9 kg). His oral temperature is 97.6 F (36.4 C). His blood pressure is 122/64 and his pulse is 64. His respiration is 18 and oxygen saturation is 100%.   Wt Readings from Last 3 Encounters:  03/06/23 154 lb (69.9 kg)  02/25/23 153 lb 3.2 oz (69.5 kg)  01/28/23 150 lb (68 kg)    Physical Exam Vitals reviewed.  HENT:     Head: Normocephalic and atraumatic.  Eyes:     Pupils: Pupils are equal, round, and  reactive to light.  Cardiovascular:     Rate and Rhythm: Normal rate and regular rhythm.     Heart sounds: Normal heart sounds.  Pulmonary:     Effort: Pulmonary effort is normal.     Breath sounds: Normal breath sounds.  Abdominal:     General: Bowel sounds are normal.     Palpations: Abdomen is soft.  Musculoskeletal:        General: No tenderness or deformity. Normal range of motion.     Cervical back: Normal range of motion.     Comments: He does have a little bit of tenderness in the right lateral lower rib cage.  No masses are noted.  There is no swelling as noted.  Lymphadenopathy:     Cervical: No cervical adenopathy.  Skin:    General: Skin is warm and dry.     Findings: No erythema or rash.  Neurological:     Mental Status: He is alert and oriented to person, place, and time.  Psychiatric:        Behavior: Behavior normal.        Thought Content: Thought content normal.        Judgment: Judgment normal.     Lab Results  Component Value Date   WBC 6.6 03/06/2023   HGB 13.9 03/06/2023   HCT 39.9 03/06/2023   MCV 90.9 03/06/2023   PLT 215 03/06/2023     Chemistry      Component Value Date/Time   NA 136 03/06/2023 0954   NA 138 04/11/2021 1130   K 5.3 (H) 03/06/2023 0954   CL 101 03/06/2023 0954   CO2 30 03/06/2023 0954   BUN 26 (H) 03/06/2023 0954   BUN 20 04/11/2021 1130   CREATININE 1.25 (H) 03/06/2023 0954      Component Value Date/Time   CALCIUM 10.4 (H) 03/06/2023 0954   ALKPHOS 96 03/06/2023 0954   AST 10 (L) 03/06/2023 0954   ALT 8 03/06/2023 0954   BILITOT 0.5 03/06/2023 0954       Impression and Plan: Mr. Rondan is a very nice 77 year old white male.  He has a history of adenocarcinoma of the lung.  He seemed to have an area of disease on MRI that was in the right eighth and 10th rib.  I think he had radiation for this as completed in February.  Again, he has radiotherapy right now.  He is going to finish up I think in 1 day.  We will have  to set him up with another PET scan.  We will probably do this in February.  He will get his Rivka Barbara today.  Hopefully, we can get him back after he has the PET scan.  He actually does look quite  good. .  Josph Macho, MD 12/6/202410:57 AM

## 2023-03-06 NOTE — Patient Instructions (Signed)
Denosumab Injection (Oncology) What is this medication? DENOSUMAB (den oh SUE mab) prevents weakened bones caused by cancer. It may also be used to treat noncancerous bone tumors that cannot be removed by surgery. It can also be used to treat high calcium levels in the blood caused by cancer. It works by blocking a protein that causes bones to break down quickly. This slows down the release of calcium from bones, which lowers calcium levels in your blood. It also makes your bones stronger and less likely to break (fracture). This medicine may be used for other purposes; ask your health care provider or pharmacist if you have questions. COMMON BRAND NAME(S): XGEVA What should I tell my care team before I take this medication? They need to know if you have any of these conditions: Dental disease Having surgery or tooth extraction Infection Kidney disease Low levels of calcium or vitamin D in the blood Malnutrition On hemodialysis Skin conditions or sensitivity Thyroid or parathyroid disease An unusual reaction to denosumab, other medications, foods, dyes, or preservatives Pregnant or trying to get pregnant Breast-feeding How should I use this medication? This medication is for injection under the skin. It is given by your care team in a hospital or clinic setting. A special MedGuide will be given to you before each treatment. Be sure to read this information carefully each time. Talk to your care team about the use of this medication in children. While it may be prescribed for children as young as 13 years for selected conditions, precautions do apply. Overdosage: If you think you have taken too much of this medicine contact a poison control center or emergency room at once. NOTE: This medicine is only for you. Do not share this medicine with others. What if I miss a dose? Keep appointments for follow-up doses. It is important not to miss your dose. Call your care team if you are unable to  keep an appointment. What may interact with this medication? Do not take this medication with any of the following: Other medications containing denosumab This medication may also interact with the following: Medications that lower your chance of fighting infection Steroid medications, such as prednisone or cortisone This list may not describe all possible interactions. Give your health care provider a list of all the medicines, herbs, non-prescription drugs, or dietary supplements you use. Also tell them if you smoke, drink alcohol, or use illegal drugs. Some items may interact with your medicine. What should I watch for while using this medication? Your condition will be monitored carefully while you are receiving this medication. You may need blood work while taking this medication. This medication may increase your risk of getting an infection. Call your care team for advice if you get a fever, chills, sore throat, or other symptoms of a cold or flu. Do not treat yourself. Try to avoid being around people who are sick. You should make sure you get enough calcium and vitamin D while you are taking this medication, unless your care team tells you not to. Discuss the foods you eat and the vitamins you take with your care team. Some people who take this medication have severe bone, joint, or muscle pain. This medication may also increase your risk for jaw problems or a broken thigh bone. Tell your care team right away if you have severe pain in your jaw, bones, joints, or muscles. Tell your care team if you have any pain that does not go away or that gets worse. Talk   to your care team if you may be pregnant. Serious birth defects can occur if you take this medication during pregnancy and for 5 months after the last dose. You will need a negative pregnancy test before starting this medication. Contraception is recommended while taking this medication and for 5 months after the last dose. Your care team  can help you find the option that works for you. What side effects may I notice from receiving this medication? Side effects that you should report to your care team as soon as possible: Allergic reactions--skin rash, itching, hives, swelling of the face, lips, tongue, or throat Bone, joint, or muscle pain Low calcium level--muscle pain or cramps, confusion, tingling, or numbness in the hands or feet Osteonecrosis of the jaw--pain, swelling, or redness in the mouth, numbness of the jaw, poor healing after dental work, unusual discharge from the mouth, visible bones in the mouth Side effects that usually do not require medical attention (report to your care team if they continue or are bothersome): Cough Diarrhea Fatigue Headache Nausea This list may not describe all possible side effects. Call your doctor for medical advice about side effects. You may report side effects to FDA at 1-800-FDA-1088. Where should I keep my medication? This medication is given in a hospital or clinic. It will not be stored at home. NOTE: This sheet is a summary. It may not cover all possible information. If you have questions about this medicine, talk to your doctor, pharmacist, or health care provider.  2024 Elsevier/Gold Standard (2021-08-07 00:00:00)  

## 2023-03-09 NOTE — Radiation Completion Notes (Signed)
Patient Name: Patrick Adams, Patrick Adams MRN: [id number] Date of Birth: 02-14-1946 Referring Physician: Arlan Organ, M.D. Date of Service: 2023-03-09 Radiation Oncologist: Arnette Schaumann, M.D. 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, N=cN0, M=pM1c Intent: Curative     ==========DELIVERED PLANS==========  First Treatment Date: 2023-02-18 Last Treatment Date: 2023-03-06   Plan Name: Lung_R_UHRT Site: Lung, Right Technique: IMRT Mode: Photon Dose Per Fraction: 3 Gy Prescribed Dose (Delivered / Prescribed): 30 Gy / 30 Gy Prescribed Fxs (Delivered / Prescribed): 10 / 10     ==========ON TREATMENT VISIT DATES========== 2023-02-24, 2023-03-03     ==========UPCOMING VISITS========== 06/02/2023 [facility] OFFICE VISIT Marykay Lex, MD  05/15/2023 [facility] EST PT 15 Josph Macho, MD  05/15/2023 [facility]  04/13/2023 [facility] FOLLOW UP 15 Antony Blackbird, MD        ==========APPENDIX - ON TREATMENT VISIT NOTES==========   See weekly On Treatment Notes in Epic for details in the Media tab (listed as Progress notes on the On Treatment Visit Dates listed above).

## 2023-03-29 NOTE — Progress Notes (Signed)
 [facility] Follow Up  Visit date: 04/02/2023 Reason for visit: Follow up  PCP: Lemon Lamar Hummer, MD  Chief Complaint:  AAA s/p FEVAR, PS-IDE pt visit  HPI:  Mr. Patrick Adams is a 77 y.o. 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 He has undergone the following vascular interventions:  2008: open aorto-bi-iliac repair of AAA at OSH 02/23/19: TAAA repair with 4 vessel CMD FEVAR (CA, SMA, RRA, LRA) with Dr. Missy 05/09/19: TEVAR with Bluford Heath 34 x 34 x 209 mm due to type 1a endoleak with Dr. Luwanna 05/12/19: TEVAR proximal cuff extension with Gore 37 mm x 15 cm due to type III endoleak with Dr. Luwanna. 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 Missy.   He was last seen in clinic on 02/27/2022 and presents today for their 4 year follow up. He is a participant in Dr. Rosia PS-IDE study.  Today, Mr. Patrick Adams reports doing well. He recently completed radiation for lung cancer recurrence. He endorses nerve pain associated with lung cancer treatments, but otherwise denies symptoms related to his aneurysm. Patient denies recent ER visits or hospitalizations. Patient denies fever, chills, fatigue, poor appetite, chest pain/pressure, shortness of breath, coughing, wheezing, abdominal pain, back pain, nausea, vomiting, diarrhea, constipation, rectal bleeding, difficulty with urination, leg pain, BLE edema. On daily plavix . Not taking aspirin .   RX/ ALLERGIES/ MED HX/SURG HX/ SOC HX/FAM HX: Reviewed & updated in Epic  ROS: The balance of 10 systems reviewed is negative except as noted in the HPI   PE:   Vitals:   04/02/23 1136  BP: 122/72  Pulse: 78  Temp: 36.6 C (97.8 F)   General: WD, WN male in NAD.  HEENT: Normocephalic, atraumatic, anicteric sclera. Cardiovascular: Regular rate. Pulmonary: Normal work of breathing on room air. Abd:  Soft, non-tender, non-distended. Musculoskeletal: Extremities warm and well perfused. No cyanosis, clubbing, or edema. Skin: Normal skin turgor. No rashes. Neurologic: Alert and oriented x 3. No obvious focal deficits. Psych: Appropriate affect.  Imaging: These images were personally reviewed by Dr. Missy:  CT endo CAP with contrast 02/19/2023 - Increase in size of TAAA. Persistent type Ia endoleak in the proximal graft. Sealed at distal aorta bifem. Right hypogastric artery aneurysm measures 47 mm (previously 40 mm)   Renal mesenteric duplex 02/19/2023 - No evidence of bilateral renal artery stenosis. All stents patent.   Chest/Abdominal vanguard 02/19/2023 - Unchanged radiographic appearance of the imaged thoracic component of the thoracoabdominal endograft. Fenestrated four-vessel thoracoabdominal aortic endograft which is unchanged in position from prior without evidence of complication.   Assessment/Plan:   Mr. Patrick Adams is a 77 y.o. 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 Imaging was reviewed by Dr. Missy demonstrating 47 mm right hypogastric artery aneurysm and increase in size of the TAAA. Device is sealed at the distal aorta bifem. All stents are patent. I reviewed these findings with the patient. Treatment options to include conservative management and minimally invasive repair. With conservative management he is recommended BP and cholesterol optimization. I discussed there is a 5-8 % risk rupture. Prior to a surgery he will need a dynamic CT scan with volumes  to further evaluate his condition. At this time, Mr. Patrick Adams would like more time to consider his options.   We will follow up with Mr. Patrick Adams in 2-3 weeks to  further discuss surgical treatment options. We will also follow up with Mr. Patrick Adams in 1 year (for study visit) with repeat CT Endo CAP w/ contrast, lab work, X-rays, and renal mesenteric duplex   Hypertension: Mr. Patrick Adams was encouraged to monitor and keep a log of their BP measurements with a goal under 160/80, better is 140/70, ideal is 120/60, and to work with their PCP to achieve those goals.       Scribe's Attestation: Oneil Phlegm, MD obtained and performed the history, physical exam and medical decision making elements that were  entered into the chart. Documentation assistance was provided by me personally, a scribe. Signed by Laneta Pinal, Scribe, on April 02, 2023 at 12:13 PM.   ---------------------------------------------------------------------------------------------------------------------- April 07, 2023 10:45 AM. Documentation assistance provided by the Scribe. I was present during the time the encounter was recorded. The information recorded by the Scribe was done at my direction and has been reviewed and validated by me. ----------------------------------------------------------------------------------------------------------------------

## 2023-04-10 ENCOUNTER — Encounter: Payer: Self-pay | Admitting: Radiation Oncology

## 2023-04-13 ENCOUNTER — Encounter: Payer: Self-pay | Admitting: Radiation Oncology

## 2023-04-13 ENCOUNTER — Ambulatory Visit
Admission: RE | Admit: 2023-04-13 | Discharge: 2023-04-13 | Disposition: A | Discharge status: Home / Self Care | Payer: Medicare HMO | Source: Ambulatory Visit | Attending: Radiation Oncology | Admitting: Radiation Oncology

## 2023-04-13 VITALS — BP 135/69 | HR 67 | Temp 98.3°F | Resp 20 | Ht 72.0 in | Wt 152.8 lb

## 2023-04-13 DIAGNOSIS — C3490 Malignant neoplasm of unspecified part of unspecified bronchus or lung: Secondary | ICD-10-CM

## 2023-04-13 DIAGNOSIS — C7A090 Malignant carcinoid tumor of the bronchus and lung: Secondary | ICD-10-CM

## 2023-04-13 DIAGNOSIS — Z79899 Other long term (current) drug therapy: Secondary | ICD-10-CM | POA: Diagnosis not present

## 2023-04-13 DIAGNOSIS — C7951 Secondary malignant neoplasm of bone: Secondary | ICD-10-CM | POA: Diagnosis not present

## 2023-04-13 DIAGNOSIS — C3412 Malignant neoplasm of upper lobe, left bronchus or lung: Secondary | ICD-10-CM | POA: Diagnosis present

## 2023-04-13 DIAGNOSIS — Z923 Personal history of irradiation: Secondary | ICD-10-CM | POA: Diagnosis not present

## 2023-04-13 DIAGNOSIS — Z7902 Long term (current) use of antithrombotics/antiplatelets: Secondary | ICD-10-CM | POA: Insufficient documentation

## 2023-04-13 DIAGNOSIS — Z791 Long term (current) use of non-steroidal anti-inflammatories (NSAID): Secondary | ICD-10-CM | POA: Insufficient documentation

## 2023-04-13 HISTORY — DX: Personal history of irradiation: Z92.3

## 2023-04-13 NOTE — Progress Notes (Signed)
 Patrick Adams is here today for follow up post radiation to the lung.  Lung Side: Right, patient completed treatment on 03/06/23.  Does the patient complain of any of the following: Pain:Yes- to right ribcage. Rating 6/10 Shortness of breath w/wo exertion: No Cough: Yes, productive  Hemoptysis: No Pain with swallowing: Yes but improving.  Swallowing/choking concerns: Yes, choking on both liquids and solids.  Appetite: Good Energy Level: Low Post radiation skin Changes: No    Additional comments if applicable:   BP 135/69   Pulse 67   Temp 98.3 F (36.8 C)   Resp 20   Ht 6' (1.829 m)   Wt 152 lb 12.8 oz (69.3 kg)   SpO2 100%   BMI 20.72 kg/m

## 2023-04-13 NOTE — Progress Notes (Signed)
  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 A diagnosis of Non-small cell lung cancer metastatic to bone [facility]) was also pertinent to this visit.   Metastatic non-small cell lung cancer-adenocarcinoma diagnosed in 2021, s/p lobectomy. Diagnosed with osseous metastatic disease to the right 8th and 10th ribs in December 2023, s/p radiation therapy. Now with a new hypermetabolic nodule in the posteromedial RUL concerning for disease recurrence (September 2024).      Indication for treatment: Curative        Radiation treatment dates: First Treatment Date: 2023-02-18  -  Last Treatment Date: 2023-03-06  Site/Dose/Technique/Mode:   Site: Lung, Right Technique: IMRT Mode: Photon Dose Per Fraction: 3 Gy Prescribed Dose (Delivered / Prescribed): 30 Gy / 30 Gy Prescribed Fxs (Delivered / Prescribed): 10 / 10  Narrative: The patient tolerated radiation treatment relatively well. He did have a mild cough with exertion during treatment, as well as some trouble swallowing which was due to his throat being dry.   Plan: The patient has completed radiation treatment. The patient will return to radiation oncology clinic for routine followup in one month. I advised them to call or return sooner if they have any questions or concerns related to their recovery or treatment.  -----------------------------------  Lynwood CHARM Nasuti, PhD, MD  This document serves as a record of services personally performed by Lynwood Nasuti, MD. It was created on his behalf by Dorthy Fuse, a trained medical scribe. The creation of this record is based on the scribe's personal observations and the provider's statements to them. This document has been checked and approved by the attending provider.

## 2023-04-13 NOTE — Progress Notes (Signed)
 Radiation Oncology         [phone] ________________________________  Name: Patrick Adams MRN: [id number]  Date: 04/13/2023  DOB: 06-May-1945  Follow-Up Visit Note  CC: Toborg, Lamar Flavors, MD  Timmy Maude SAUNDERS, MD  No diagnosis found.  Diagnosis: Metastatic non-small cell lung cancer-adenocarcinoma diagnosed in 2021, s/p lobectomy. Diagnosed with osseous metastatic disease to the right 8th and 10th ribs in December 2023, s/p radiation therapy. Now with a new hypermetabolic nodule in the posteromedial RUL concerning for disease recurrence (September 2024).      Interval Since Last Radiation: 1 month and 7 days   Indication for treatment: Curative         Radiation treatment dates: First Treatment Date: 2023-02-18  -  Last Treatment Date: 2023-03-06   Site/Dose/Technique/Mode:    Site: Lung, Right Technique: IMRT Mode: Photon Dose Per Fraction: 3 Gy Prescribed Dose (Delivered / Prescribed): 30 Gy / 30 Gy Prescribed Fxs (Delivered / Prescribed): 10 / 10  Narrative:  The patient returns today for routine follow-up. He tolerated radiation treatment relatively well. He did have a mild cough with exertion during treatment, as well as some trouble swallowing which was due to his throat being dry.   Since his consultation date, he has continued to follow with Dr. Timmy and receive xgeva  q3 months (most recently on 03/06/23). Dr. Timmy has scheduled his for another PET scan later this month for restaging of his disease.   No other significant oncologic interval history since he completed radiation therapy, or in the interval since his initial consultation date.   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 His is followed by [facility] vascular surgery in [place] for this.     Patient reports to be doing well overall. He notes a residual cough and some pain and difficulty with swallowing. He states that all of these side effects have been improving since completing radiation treatment. He is still experiencing rib pain which is controlled with Tylenol  and Advil.    Allergies:  is allergic to brilinta  [ticagrelor ], ciprofloxacin, and statins.  Meds: Current Outpatient Medications  Medication Sig Dispense Refill   acetaminophen  (TYLENOL ) 500 MG tablet Take 1,000 mg by mouth every 6 (six) hours as needed for moderate pain or headache.     cholecalciferol (VITAMIN D3) 25 MCG (1000 UT) tablet Take 1,000 Units by mouth daily.     clopidogrel  (PLAVIX ) 75 MG tablet Take 1 tablet (75 mg total) by mouth daily. 90 tablet 3   dronabinol  (MARINOL ) 5 MG capsule Take 1 capsule (5 mg total) by mouth 2 (two) times daily before a meal. (Patient not taking: Reported on 02/25/2023) 60 capsule 0   ibuprofen (ADVIL) 200 MG tablet Take 200 mg by mouth 2 (two) times daily. As needed     losartan  (COZAAR )  100 MG tablet Take 1 tablet (100 mg total) by mouth daily. 90 tablet 3   nitroGLYCERIN  (NITROSTAT ) 0.4 MG SL tablet Place 1 tablet (0.4 mg total) under the tongue every 5 (five) minutes as needed for chest pain. 100 tablet 3   polyethylene glycol (MIRALAX  / GLYCOLAX ) 17 g packet Take 17 g by mouth daily as needed (Constipation). 14 each 0   saccharomyces boulardii (FLORASTOR) 250 MG capsule Take 250 mg by mouth  daily.     sildenafil  (VIAGRA ) 100 MG tablet Take 1 tablet (100 mg total) by mouth daily as needed for erectile dysfunction. (Patient not taking: Reported on 02/25/2023) 10 tablet 6   No current facility-administered medications for this encounter.    Physical Findings: The patient is in no acute distress. Patient is alert and oriented.  height is 6' (1.829 m) and weight is 152 lb 12.8 oz (69.3 kg). His temperature is 98.3 F (36.8 C). His blood pressure is 135/69 and his pulse is 67. His respiration is 20 and oxygen saturation is 100%. .  No significant changes. Lungs are clear to auscultation bilaterally. Heart has regular rate and rhythm. No palpable cervical, supraclavicular, or axillary adenopathy. Abdomen soft, non-tender, normal bowel sounds.   Lab Findings: Lab Results  Component Value Date   WBC 6.6 03/06/2023   HGB 13.9 03/06/2023   HCT 39.9 03/06/2023   MCV 90.9 03/06/2023   PLT 215 03/06/2023    Radiographic Findings: No results found.  Impression:  Metastatic non-small cell lung cancer-adenocarcinoma diagnosed in 2021, s/p lobectomy. Diagnosed with osseous metastatic disease to the right 8th and 10th ribs in December 2023, s/p radiation therapy. Now with a new hypermetabolic nodule in the posteromedial RUL concerning for disease recurrence (September 2024).      The patient is doing well and continues to recover from the side effects of radiation.   Plan:  Patient will continue follow-up under the care of Dr. Timmy. He is scheduled for a PET scan on 04/27/23 and an appointment with Dr. Timmy to follow on 05/15/23. Radiation follow-up PRN. We appreciate the opportunity to take part in this patient's care. He knows to call with any questions or concerns.    20 minutes of total time was spent for this patient encounter, including preparation, face-to-face counseling with the patient and coordination of care, physical exam, and documentation of the  encounter. ____________________________________   Leeroy Due, PA-C  This document serves as a record of services personally performed by Leeroy Due, PA-C. It was created on his behalf by Dorthy Fuse, a trained medical scribe. The creation of this record is based on the scribe's personal observations and the provider's statements to them. This document has been checked and approved by the attending provider.

## 2023-04-27 ENCOUNTER — Ambulatory Visit (HOSPITAL_COMMUNITY)
Admission: RE | Admit: 2023-04-27 | Discharge: 2023-04-27 | Disposition: A | Discharge status: Home / Self Care | Payer: Medicare HMO | Source: Ambulatory Visit | Attending: Hematology & Oncology | Admitting: Hematology & Oncology

## 2023-04-27 ENCOUNTER — Encounter: Payer: Self-pay | Admitting: *Deleted

## 2023-04-27 DIAGNOSIS — C7951 Secondary malignant neoplasm of bone: Secondary | ICD-10-CM | POA: Diagnosis present

## 2023-04-27 DIAGNOSIS — C3490 Malignant neoplasm of unspecified part of unspecified bronchus or lung: Secondary | ICD-10-CM | POA: Insufficient documentation

## 2023-04-27 LAB — GLUCOSE, CAPILLARY: Glucose-Capillary: 111 mg/dL — ABNORMAL HIGH (ref 70–99)

## 2023-04-27 MED ORDER — FLUDEOXYGLUCOSE F - 18 (FDG) INJECTION
7.6000 | Freq: Once | INTRAVENOUS | Status: AC
  Administered 2023-04-27: 7.56 via INTRAVENOUS

## 2023-04-28 ENCOUNTER — Other Ambulatory Visit: Payer: Self-pay

## 2023-04-28 MED ORDER — METRONIDAZOLE 500 MG PO TABS: 500.0000 mg | ORAL_TABLET | Freq: Three times a day (TID) | ORAL | 0 refills | Status: AC

## 2023-04-28 NOTE — Telephone Encounter (Signed)
Patient called stating he saw on his PET scan he has diverticulitis and has stomach pain and is requesting an abx. MD informed. Per MD order for flagyl received and sent to pharmacy and patient informed.  FYI from patient: Appt with [facility] on 1/30.

## 2023-04-29 ENCOUNTER — Encounter (HOSPITAL_COMMUNITY): Payer: Medicare HMO

## 2023-05-11 ENCOUNTER — Telehealth: Payer: Self-pay

## 2023-05-11 NOTE — Telephone Encounter (Signed)
 Received call from pt reporting "extreme" bilateral rib pain "from where they did the radiation." Historically pt's pain has been controlled by Tylenol  and Advil, however he reports that even alternating the two meds over the weekend, he would only get "about two hours of relief" at a time. Reports pain is keeping him from sleeping and eating. Pt is also recovering from an episode of diverticulitis which is attributes to the pain progressing over the last 5-7 days. Denies chest pain, SOB, cough, nausea. Pt took a Tramadol  5 minutes prior to this conversation and does not wish to be prescribed anything stronger d/t constipation. Pt is scheduled to see MD Friday and is requesting to be seen sooner.  Per Dr Maria Shiner, pt to come in tomorrow. Offered pt refill on Tramadol  but he reports he has enough to last until office visit.

## 2023-05-12 ENCOUNTER — Inpatient Hospital Stay (HOSPITAL_BASED_OUTPATIENT_CLINIC_OR_DEPARTMENT_OTHER): Payer: Medicare HMO | Admitting: Hematology & Oncology

## 2023-05-12 ENCOUNTER — Telehealth: Payer: Self-pay

## 2023-05-12 ENCOUNTER — Encounter: Payer: Self-pay | Admitting: Hematology & Oncology

## 2023-05-12 ENCOUNTER — Inpatient Hospital Stay: Payer: Medicare HMO

## 2023-05-12 ENCOUNTER — Inpatient Hospital Stay: Payer: Medicare HMO | Attending: Hematology & Oncology

## 2023-05-12 VITALS — BP 136/63 | HR 58

## 2023-05-12 VITALS — BP 130/56 | HR 64 | Temp 98.1°F | Resp 20 | Ht 72.0 in | Wt 148.1 lb

## 2023-05-12 DIAGNOSIS — C7951 Secondary malignant neoplasm of bone: Secondary | ICD-10-CM

## 2023-05-12 DIAGNOSIS — K5900 Constipation, unspecified: Secondary | ICD-10-CM | POA: Insufficient documentation

## 2023-05-12 DIAGNOSIS — G893 Neoplasm related pain (acute) (chronic): Secondary | ICD-10-CM | POA: Insufficient documentation

## 2023-05-12 DIAGNOSIS — R062 Wheezing: Secondary | ICD-10-CM | POA: Diagnosis not present

## 2023-05-12 DIAGNOSIS — Z923 Personal history of irradiation: Secondary | ICD-10-CM | POA: Diagnosis not present

## 2023-05-12 DIAGNOSIS — C3490 Malignant neoplasm of unspecified part of unspecified bronchus or lung: Secondary | ICD-10-CM

## 2023-05-12 DIAGNOSIS — F17210 Nicotine dependence, cigarettes, uncomplicated: Secondary | ICD-10-CM | POA: Insufficient documentation

## 2023-05-12 DIAGNOSIS — C3411 Malignant neoplasm of upper lobe, right bronchus or lung: Secondary | ICD-10-CM | POA: Diagnosis present

## 2023-05-12 LAB — CBC WITH DIFFERENTIAL (CANCER CENTER ONLY)
Abs Immature Granulocytes: 0.02 10*3/uL (ref 0.00–0.07)
Basophils Absolute: 0 10*3/uL (ref 0.0–0.1)
Basophils Relative: 0 %
Eosinophils Absolute: 0.1 10*3/uL (ref 0.0–0.5)
Eosinophils Relative: 2 %
HCT: 39.2 % (ref 39.0–52.0)
Hemoglobin: 13.7 g/dL (ref 13.0–17.0)
Immature Granulocytes: 0 %
Lymphocytes Relative: 18 %
Lymphs Abs: 0.9 10*3/uL (ref 0.7–4.0)
MCH: 31.8 pg (ref 26.0–34.0)
MCHC: 34.9 g/dL (ref 30.0–36.0)
MCV: 91 fL (ref 80.0–100.0)
Monocytes Absolute: 0.4 10*3/uL (ref 0.1–1.0)
Monocytes Relative: 7 %
Neutro Abs: 3.6 10*3/uL (ref 1.7–7.7)
Neutrophils Relative %: 73 %
Platelet Count: 222 10*3/uL (ref 150–400)
RBC: 4.31 MIL/uL (ref 4.22–5.81)
RDW: 12.1 % (ref 11.5–15.5)
WBC Count: 4.9 10*3/uL (ref 4.0–10.5)
nRBC: 0 % (ref 0.0–0.2)

## 2023-05-12 LAB — CMP (CANCER CENTER ONLY)
ALT: 7 U/L (ref 0–44)
AST: 11 U/L — ABNORMAL LOW (ref 15–41)
Albumin: 4 g/dL (ref 3.5–5.0)
Alkaline Phosphatase: 98 U/L (ref 38–126)
Anion gap: 7 (ref 5–15)
BUN: 13 mg/dL (ref 8–23)
CO2: 28 mmol/L (ref 22–32)
Calcium: 9.4 mg/dL (ref 8.9–10.3)
Chloride: 102 mmol/L (ref 98–111)
Creatinine: 1.11 mg/dL (ref 0.61–1.24)
GFR, Estimated: >60 mL/min (ref >60)
Glucose, Bld: 100 mg/dL — ABNORMAL HIGH (ref 70–99)
Potassium: 4.1 mmol/L (ref 3.5–5.1)
Sodium: 137 mmol/L (ref 135–145)
Total Bilirubin: 0.7 mg/dL (ref 0.0–1.2)
Total Protein: 6.7 g/dL (ref 6.5–8.1)

## 2023-05-12 MED ORDER — KETOROLAC TROMETHAMINE 15 MG/ML IJ SOLN
30.0000 mg | Freq: Once | INTRAMUSCULAR | Status: AC
Start: 2023-05-12 — End: 2023-05-12
  Administered 2023-05-12: 30 mg via INTRAVENOUS
  Filled 2023-05-12: qty 2

## 2023-05-12 MED ORDER — SODIUM CHLORIDE 0.9 % IV SOLN
20.0000 mg | Freq: Once | INTRAVENOUS | Status: AC
  Administered 2023-05-12: 20 mg via INTRAVENOUS
  Filled 2023-05-12: qty 20

## 2023-05-12 MED ORDER — SODIUM CHLORIDE 0.9 % IV SOLN: INTRAVENOUS | Status: DC

## 2023-05-12 MED ORDER — PANTOPRAZOLE SODIUM 40 MG PO TBEC: 40.0000 mg | DELAYED_RELEASE_TABLET | Freq: Every day | ORAL | 4 refills | Status: DC

## 2023-05-12 MED ORDER — PREDNISONE 20 MG PO TABS: 60.0000 mg | ORAL_TABLET | Freq: Every day | ORAL | 4 refills | Status: DC

## 2023-05-12 MED ORDER — LIDOCAINE 5 % EX PTCH: 1.0000 | MEDICATED_PATCH | CUTANEOUS | 4 refills | Status: AC

## 2023-05-12 MED ORDER — NYSTATIN 100000 UNIT/ML MT SUSP: 5.0000 mL | Freq: Four times a day (QID) | OROMUCOSAL | 0 refills | Status: DC

## 2023-05-12 MED ORDER — PREGABALIN 75 MG PO CAPS: 75.0000 mg | ORAL_CAPSULE | Freq: Two times a day (BID) | ORAL | 0 refills | Status: DC

## 2023-05-12 NOTE — Progress Notes (Signed)
Hematology and Oncology Follow Up Visit  Patrick Adams [id number] March 17, 1946 78 y.o. 05/12/2023   Principle Diagnosis:  Metastatic non-small cell lung cancer-adenocarcinoma. --- NO actionable mutations  Current Therapy:   Xgeva 120 mg subcu every 3 months-next dose in 05/2023  XRT to the right upper lobe nodule -definitive treatment on 03/09/2023     Interim History:  Patrick Adams is back for a follow-up.  Unfortunately, Patrick Adams has not doing well at all.  Patrick Adams is having quite a bit of pain.  I did say that Patrick Adams is having so much pain.  The pain is in his right side.  Patrick Adams had radiotherapy over there previously.  Patrick Adams also has pain in the right shoulder.  Patrick Adams did have a PET scan that was done on 04/27/2023.  The PET scan showed that there did not appear to be any obvious disease progression.  Patrick Adams had increased in volume and metabolic activity in the right lower lung.  Patrick Adams had no focal hypermetabolic activity in his skeletal region.  Patrick Adams did have intense activity the sigmoid colon as well to be diverticulosis.  Patrick Adams will not take opioids.  Patrick Adams says this really constipates him.  It sounds like this might be inflammatory type of pain.  I will go ahead and have him take some IV Toradol and some IV Decadron in the office today.  At home, we will have him apply a Lidoderm patch to his right rib area and also have him take prednisone.  I just want quality of life to be important for him.   We will see about doing maybe a bone scan on him.  I do not know if this might give [place] an idea as to what could be going on.  Otherwise, Patrick Adams says Patrick Adams feels okay.  Patrick Adams is eating although with this pain, is not eating all that much.  Patrick Adams is not sleeping that well.  I also recommended some gabapentin.  Patrick Adams does not wish to have gabapentin.  Maybe, we can try some Lyrica and see if this may help.  Patrick Adams has had no bleeding.  Patrick Adams has had no issues with the bowels or bladder.  Patrick Adams has had no hemoptysis.  There is been no mouth  sores.  Overall, I would say that his performance status is probably ECOG 1.   Medications:  Current Outpatient Medications:    acetaminophen (TYLENOL) 500 MG tablet, Take 1,000 mg by mouth every 6 (six) hours as needed for moderate pain or headache., Disp: , Rfl:    cholecalciferol (VITAMIN D3) 25 MCG (1000 UT) tablet, Take 1,000 Units by mouth daily., Disp: , Rfl:    clopidogrel (PLAVIX) 75 MG tablet, Take 1 tablet (75 mg total) by mouth daily., Disp: 90 tablet, Rfl: 3   ibuprofen (ADVIL) 200 MG tablet, Take 200 mg by mouth 2 (two) times daily. As needed, Disp: , Rfl:    losartan (COZAAR) 100 MG tablet, Take 1 tablet (100 mg total) by mouth daily., Disp: 90 tablet, Rfl: 3   polyethylene glycol (MIRALAX / GLYCOLAX) 17 g packet, Take 17 g by mouth daily as needed (Constipation)., Disp: 14 each, Rfl: 0   saccharomyces boulardii (FLORASTOR) 250 MG capsule, Take 250 mg by mouth daily., Disp: , Rfl:    traMADol (ULTRAM) 50 MG tablet, Take 50 mg by mouth every 6 (six) hours as needed., Disp: , Rfl:    dronabinol (MARINOL) 5 MG capsule, Take 1 capsule (5 mg total) by mouth 2 (two) times  daily before a meal. (Patient not taking: Reported on 05/12/2023), Disp: 60 capsule, Rfl: 0   nitroGLYCERIN (NITROSTAT) 0.4 MG SL tablet, Place 1 tablet (0.4 mg total) under the tongue every 5 (five) minutes as needed for chest pain., Disp: 100 tablet, Rfl: 3   sildenafil (VIAGRA) 100 MG tablet, Take 1 tablet (100 mg total) by mouth daily as needed for erectile dysfunction. (Patient not taking: Reported on 02/25/2023), Disp: 10 tablet, Rfl: 6  Allergies:  Allergies  Allergen Reactions   Brilinta [Ticagrelor] Shortness Of Breath     Patient was taking 60 mg  Twice a day - changed to Clopidogrel 75 mg  03/02/19   Ciprofloxacin Anaphylaxis   Statins Other (See Comments)    Joint pain.Myalgias -- HAS TRIED LIPITOR, ZOCOR & CRESTOR    Past Medical History, Surgical history, Social history, and Family History were  reviewed and updated.  Review of Systems: Review of Systems  Constitutional:  Positive for unexpected weight change.  HENT:  Negative.    Eyes: Negative.   Respiratory: Negative.    Cardiovascular:  Positive for chest pain.  Gastrointestinal: Negative.   Endocrine: Negative.   Genitourinary: Negative.    Musculoskeletal: Negative.   Skin: Negative.   Neurological: Negative.   Hematological: Negative.   Psychiatric/Behavioral: Negative.      Physical Exam:  height is 6' (1.829 m) and weight is 148 lb 1.9 oz (67.2 kg). His oral temperature is 98.1 F (36.7 C). His blood pressure is 130/56 (abnormal) and his pulse is 64. His respiration is 20 and oxygen saturation is 100%.   Wt Readings from Last 3 Encounters:  05/12/23 148 lb 1.9 oz (67.2 kg)  04/13/23 152 lb 12.8 oz (69.3 kg)  03/06/23 154 lb (69.9 kg)    Physical Exam Vitals reviewed.  HENT:     Head: Normocephalic and atraumatic.  Eyes:     Pupils: Pupils are equal, round, and reactive to light.  Cardiovascular:     Rate and Rhythm: Normal rate and regular rhythm.     Heart sounds: Normal heart sounds.  Pulmonary:     Effort: Pulmonary effort is normal.     Breath sounds: Normal breath sounds.  Abdominal:     General: Bowel sounds are normal.     Palpations: Abdomen is soft.  Musculoskeletal:        General: No tenderness or deformity. Normal range of motion.     Cervical back: Normal range of motion.     Comments: Patrick Adams does have a little bit of tenderness in the right lateral lower rib cage.  No masses are noted.  There is no swelling as noted.  Lymphadenopathy:     Cervical: No cervical adenopathy.  Skin:    General: Skin is warm and dry.     Findings: No erythema or rash.  Neurological:     Mental Status: Patrick Adams is alert and oriented to person, place, and time.  Psychiatric:        Behavior: Behavior normal.        Thought Content: Thought content normal.        Judgment: Judgment normal.     Lab Results   Component Value Date   WBC 4.9 05/12/2023   HGB 13.7 05/12/2023   HCT 39.2 05/12/2023   MCV 91.0 05/12/2023   PLT 222 05/12/2023     Chemistry      Component Value Date/Time   NA 137 05/12/2023 0815   NA 138 04/11/2021 1130  K 4.1 05/12/2023 0815   CL 102 05/12/2023 0815   CO2 28 05/12/2023 0815   BUN 13 05/12/2023 0815   BUN 20 04/11/2021 1130   CREATININE 1.11 05/12/2023 0815      Component Value Date/Time   CALCIUM 9.4 05/12/2023 0815   ALKPHOS 98 05/12/2023 0815   AST 11 (L) 05/12/2023 0815   ALT 7 05/12/2023 0815   BILITOT 0.7 05/12/2023 0815       Impression and Plan: Patrick Adams is a very nice 78 year old white male.  Patrick Adams has a history of adenocarcinoma of the lung.  Patrick Adams seemed to have an area of disease on MRI that was in the right eighth and 10th rib.  I think Patrick Adams had radiation for this as completed in February.  Patrick Adams has completed radiotherapy to his right upper lung.  Again the issue now is pain control.  I will see how we can try to help this with the IV therapy.  Hopefully, prednisone will help at home.  Maybe, Lyrica will make a difference.  I will try him on some Lyrica.  I hate that Patrick Adams is suffering.  I know this is very difficult.  I am going to have to get him back in probably 2 weeks so we can see if his pain is under better control.    Josph Macho, MD 2/11/20259:05 AM

## 2023-05-12 NOTE — Telephone Encounter (Signed)
Notified Patient of prior authorization approvals for Pregabalin and Lidocaine 5% Patches. Medication are approved through 03/30/2024. Pharmacy notified. No other needs or concerns noted at this time.

## 2023-05-12 NOTE — Patient Instructions (Signed)

## 2023-05-13 ENCOUNTER — Inpatient Hospital Stay: Payer: Medicare HMO | Admitting: Dietician

## 2023-05-13 ENCOUNTER — Telehealth: Payer: Self-pay | Admitting: *Deleted

## 2023-05-13 ENCOUNTER — Ambulatory Visit: Payer: Medicare HMO

## 2023-05-13 NOTE — Telephone Encounter (Signed)
Per Dr. Myna Hidalgo, please call patient and check on his pain level. Patient stated," I feel much better this morning. I started all the new medications this morning. The pharmacy had to order the Lyrica, so, I will pick this up today." I instructed him to call the office if he had any other questions, concerns or if the pain level increases before his next scheduled visit. He verbalized understanding and thanked me for checking up on him.

## 2023-05-13 NOTE — Progress Notes (Signed)
Nutrition Assessment   Reason for Assessment: MST screen for weight loss.    ASSESSMENT: Patient is 78 year old male with Metastatic non-small cell lung cancer-adenocarcinoma. --- NO actionable mutations, diverticulitis and recent flare with PMHx IBS. He is followed by Dr. Myna Hidalgo and reports he had his flare 2 weeks ago and was put on Abx.  He is not much of a cook and doesn't always eat regula meals.  He tends to get hooked on a food and binge eat it. Used to love cashews and would sit and eat up a whole bunch in front of TV.  Usual pattern is breakfast with bran flakes,  or scrambled eggs , and one meal a day or grazing.  Likes grilled chicken or mashed potatoes Loves cashews, eats applesauce most nights,used to have sandwich at night but IBS flared and he stopped that does take a probiotic and a Vit D.  Now trying to drink a Boost  plus each day.   Anthropometrics: weight has been as low as 138# when first diagnosed but hovering between 145-150# past year  Height: 72" Weight: 148# UBW: 145-150# past year BMI: 20.09  NUTRITION DIAGNOSIS: Inadequate PO intake to meet increased nutrient needs, r/t recent diverticulitis flare   INTERVENTION:  Relayed that nutrition services are wrap around service provided at no charge and encouraged continued communication if experiencing continued weight loss or any nutritional impact symptoms (NIS). Educated on importance of adequate calorie and protein energy intake  with nutrient dense foods when possible to maintain weight/strength Encouraged small frequent feeds and trying to eat every 3-4 hours Suggested oral nutrition supplement Discussed strategies for reducing skins and seeds and increasing soluble fiber to aid in improved gut health and act as prebiotic Emailed Nutrition Tip sheet  for  High Protein High Calorie Snacking with contact information provided   MONITORING, EVALUATION, GOAL: weight, PO intake, Nutrition Impact Symptoms, labs Goal  is weight maintenance  Next Visit: Remote next month  Gennaro Africa, RDN, LDN Registered Dietitian, [facility] Part Time Remote (Usual office hours: Tuesday-Thursday) Cell: [phone]

## 2023-05-15 ENCOUNTER — Inpatient Hospital Stay: Payer: Medicare HMO

## 2023-05-15 ENCOUNTER — Telehealth: Payer: Self-pay | Admitting: *Deleted

## 2023-05-15 ENCOUNTER — Ambulatory Visit: Payer: Medicare HMO | Admitting: Hematology & Oncology

## 2023-05-15 NOTE — Telephone Encounter (Signed)
Call received from patient stating that he has had diarrhea and stomach cramps since starting Prednisone and Protonix on Wednesday morning. Dr. Myna Hidalgo notified. Call placed back to patient and patient notified per order of Dr. Myna Hidalgo to take OTC Imodium with each loose stool, max of 8 a day.  Pt is appreciative of call back and states that he may choose to stop the Prednisone or Protonix.  Dr. Myna Hidalgo notified.  No further orders received.

## 2023-05-22 ENCOUNTER — Inpatient Hospital Stay: Payer: Medicare HMO

## 2023-05-22 ENCOUNTER — Other Ambulatory Visit: Payer: Self-pay

## 2023-05-22 ENCOUNTER — Encounter: Payer: Self-pay | Admitting: Hematology & Oncology

## 2023-05-22 ENCOUNTER — Inpatient Hospital Stay: Payer: Medicare HMO | Admitting: Hematology & Oncology

## 2023-05-22 VITALS — BP 120/57 | HR 68 | Temp 98.0°F | Resp 18 | Ht 72.0 in | Wt 148.0 lb

## 2023-05-22 DIAGNOSIS — C7951 Secondary malignant neoplasm of bone: Secondary | ICD-10-CM | POA: Diagnosis not present

## 2023-05-22 DIAGNOSIS — C3490 Malignant neoplasm of unspecified part of unspecified bronchus or lung: Secondary | ICD-10-CM | POA: Diagnosis not present

## 2023-05-22 DIAGNOSIS — C3411 Malignant neoplasm of upper lobe, right bronchus or lung: Secondary | ICD-10-CM | POA: Diagnosis not present

## 2023-05-22 LAB — CBC WITH DIFFERENTIAL (CANCER CENTER ONLY)
Abs Immature Granulocytes: 0.02 10*3/uL (ref 0.00–0.07)
Basophils Absolute: 0 10*3/uL (ref 0.0–0.1)
Basophils Relative: 0 %
Eosinophils Absolute: 0.1 10*3/uL (ref 0.0–0.5)
Eosinophils Relative: 3 %
HCT: 39.3 % (ref 39.0–52.0)
Hemoglobin: 13.2 g/dL (ref 13.0–17.0)
Immature Granulocytes: 0 %
Lymphocytes Relative: 11 %
Lymphs Abs: 0.6 10*3/uL — ABNORMAL LOW (ref 0.7–4.0)
MCH: 31.4 pg (ref 26.0–34.0)
MCHC: 33.6 g/dL (ref 30.0–36.0)
MCV: 93.3 fL (ref 80.0–100.0)
Monocytes Absolute: 0.7 10*3/uL (ref 0.1–1.0)
Monocytes Relative: 14 %
Neutro Abs: 3.6 10*3/uL (ref 1.7–7.7)
Neutrophils Relative %: 72 %
Platelet Count: 202 10*3/uL (ref 150–400)
RBC: 4.21 MIL/uL — ABNORMAL LOW (ref 4.22–5.81)
RDW: 12.8 % (ref 11.5–15.5)
WBC Count: 5 10*3/uL (ref 4.0–10.5)
nRBC: 0 % (ref 0.0–0.2)

## 2023-05-22 LAB — CMP (CANCER CENTER ONLY)
ALT: 8 U/L (ref 0–44)
AST: 9 U/L — ABNORMAL LOW (ref 15–41)
Albumin: 3.9 g/dL (ref 3.5–5.0)
Alkaline Phosphatase: 96 U/L (ref 38–126)
Anion gap: 7 (ref 5–15)
BUN: 19 mg/dL (ref 8–23)
CO2: 30 mmol/L (ref 22–32)
Calcium: 10.2 mg/dL (ref 8.9–10.3)
Chloride: 100 mmol/L (ref 98–111)
Creatinine: 1.13 mg/dL (ref 0.61–1.24)
GFR, Estimated: >60 mL/min (ref >60)
Glucose, Bld: 104 mg/dL — ABNORMAL HIGH (ref 70–99)
Potassium: 3.7 mmol/L (ref 3.5–5.1)
Sodium: 137 mmol/L (ref 135–145)
Total Bilirubin: 0.4 mg/dL (ref 0.0–1.2)
Total Protein: 6.2 g/dL — ABNORMAL LOW (ref 6.5–8.1)

## 2023-05-22 NOTE — Progress Notes (Signed)
 Hematology and Oncology Follow Up Visit  Patrick Adams [id number] 02/10/1946 78 y.o. 05/22/2023   Principle Diagnosis:  Metastatic non-small cell lung cancer-adenocarcinoma. --- NO actionable mutations  Current Therapy:   Xgeva 120 mg subcu every 3 months-next dose in 05/2023  XRT to the right upper lobe nodule -definitive treatment on 03/09/2023     Interim History:  Patrick Adams is back for a follow-up.  He is feeling a whole lot better.  His pain is doing a whole lot better.  He I had a long anti-inflammatories with prednisone.  He took prednisone for a couple of days and stopped taking it because he could not sleep.  He currently is on Lyrica which seems to be helping.  He also uses a Lidoderm patch.  His appetite is better.  He is little constipated now.  I told her to take MiraLAX once or twice a day.  He is wheezing.  Again he is a smoker.  I think the prednisone will help him out.  I told him to start taking 40 mg a day of prednisone.  I told to take it for 5 days and then go down to 20 mg a day for 5 days and then take 10 mg a day 5 days.  I am happy that he is doing better.  I am happy that his quality of life is better.  Currently, I would say that his performance status is probably ECOG 1.   Medications:  Current Outpatient Medications:    acetaminophen (TYLENOL) 500 MG tablet, Take 1,000 mg by mouth every 6 (six) hours as needed for moderate pain or headache., Disp: , Rfl:    cholecalciferol (VITAMIN D3) 25 MCG (1000 UT) tablet, Take 1,000 Units by mouth daily., Disp: , Rfl:    clopidogrel (PLAVIX) 75 MG tablet, Take 1 tablet (75 mg total) by mouth daily., Disp: 90 tablet, Rfl: 3   dronabinol (MARINOL) 5 MG capsule, Take 1 capsule (5 mg total) by mouth 2 (two) times daily before a meal. (Patient not taking: Reported on 05/12/2023), Disp: 60 capsule, Rfl: 0   ibuprofen (ADVIL) 200 MG tablet, Take 200 mg by mouth 2 (two) times daily. As needed, Disp: , Rfl:    lidocaine  (LIDODERM) 5 %, Place 1 patch onto the skin daily. Remove & Discard patch within 12 hours or as directed by MD, Disp: 30 patch, Rfl: 4   losartan (COZAAR) 100 MG tablet, Take 1 tablet (100 mg total) by mouth daily., Disp: 90 tablet, Rfl: 3   nitroGLYCERIN (NITROSTAT) 0.4 MG SL tablet, Place 1 tablet (0.4 mg total) under the tongue every 5 (five) minutes as needed for chest pain., Disp: 100 tablet, Rfl: 3   nystatin (MYCOSTATIN) 100000 UNIT/ML suspension, Take 5 mLs (500,000 Units total) by mouth 4 (four) times daily., Disp: 60 mL, Rfl: 0   polyethylene glycol (MIRALAX / GLYCOLAX) 17 g packet, Take 17 g by mouth daily as needed (Constipation)., Disp: 14 each, Rfl: 0   pregabalin (LYRICA) 75 MG capsule, Take 1 capsule (75 mg total) by mouth 2 (two) times daily., Disp: 60 capsule, Rfl: 0   saccharomyces boulardii (FLORASTOR) 250 MG capsule, Take 250 mg by mouth daily., Disp: , Rfl:    sildenafil (VIAGRA) 100 MG tablet, Take 1 tablet (100 mg total) by mouth daily as needed for erectile dysfunction. (Patient not taking: Reported on 02/25/2023), Disp: 10 tablet, Rfl: 6  Allergies:  Allergies  Allergen Reactions   Brilinta [Ticagrelor] Shortness Of Breath  Patient was taking 60 mg  Twice a day - changed to Clopidogrel 75 mg  03/02/19   Ciprofloxacin Anaphylaxis   Statins Other (See Comments)    Joint pain.Myalgias -- HAS TRIED LIPITOR, ZOCOR & CRESTOR    Past Medical History, Surgical history, Social history, and Family History were reviewed and updated.  Review of Systems: Review of Systems  Constitutional:  Positive for unexpected weight change.  HENT:  Negative.    Eyes: Negative.   Respiratory: Negative.    Cardiovascular:  Positive for chest pain.  Gastrointestinal: Negative.   Endocrine: Negative.   Genitourinary: Negative.    Musculoskeletal: Negative.   Skin: Negative.   Neurological: Negative.   Hematological: Negative.   Psychiatric/Behavioral: Negative.      Physical  Exam:  height is 6' (1.829 m) and weight is 148 lb (67.1 kg). His oral temperature is 98 F (36.7 C). His blood pressure is 120/57 (abnormal) and his pulse is 68. His respiration is 18 and oxygen saturation is 98%.   Wt Readings from Last 3 Encounters:  05/22/23 148 lb (67.1 kg)  05/12/23 148 lb 1.9 oz (67.2 kg)  04/13/23 152 lb 12.8 oz (69.3 kg)    Physical Exam Vitals reviewed.  HENT:     Head: Normocephalic and atraumatic.  Eyes:     Pupils: Pupils are equal, round, and reactive to light.  Cardiovascular:     Rate and Rhythm: Normal rate and regular rhythm.     Heart sounds: Normal heart sounds.  Pulmonary:     Effort: Pulmonary effort is normal.     Breath sounds: Normal breath sounds.  Abdominal:     General: Bowel sounds are normal.     Palpations: Abdomen is soft.  Musculoskeletal:        General: No tenderness or deformity. Normal range of motion.     Cervical back: Normal range of motion.     Comments: He does have a little bit of tenderness in the right lateral lower rib cage.  No masses are noted.  There is no swelling as noted.  Lymphadenopathy:     Cervical: No cervical adenopathy.  Skin:    General: Skin is warm and dry.     Findings: No erythema or rash.  Neurological:     Mental Status: He is alert and oriented to person, place, and time.  Psychiatric:        Behavior: Behavior normal.        Thought Content: Thought content normal.        Judgment: Judgment normal.      Lab Results  Component Value Date   WBC 5.0 05/22/2023   HGB 13.2 05/22/2023   HCT 39.3 05/22/2023   MCV 93.3 05/22/2023   PLT 202 05/22/2023     Chemistry      Component Value Date/Time   NA 137 05/22/2023 0834   NA 138 04/11/2021 1130   K 3.7 05/22/2023 0834   CL 100 05/22/2023 0834   CO2 30 05/22/2023 0834   BUN 19 05/22/2023 0834   BUN 20 04/11/2021 1130   CREATININE 1.13 05/22/2023 0834      Component Value Date/Time   CALCIUM 10.2 05/22/2023 0834   ALKPHOS 96  05/22/2023 0834   AST 9 (L) 05/22/2023 0834   ALT 8 05/22/2023 0834   BILITOT 0.4 05/22/2023 0834       Impression and Plan: Patrick Adams is a very nice 78 year old white male.  He has a history  of adenocarcinoma of the lung.  He seemed to have an area of disease on MRI that was in the right eighth and 10th rib.  I think he had radiation for this as completed in February.  He has completed radiotherapy to his right upper lung.  I am happy that his pain is under better control.  Hopefully, we will continue to manage this with the Lyrica and the Lidoderm patch.  I will plan to see him back now in about a month.  We do not need to do any scans on him probably until April or May.  I am sure that he will let us know if he has any problems.   Josph Macho, MD 2/21/20259:27 AM

## 2023-06-02 ENCOUNTER — Encounter: Payer: Self-pay | Admitting: Cardiology

## 2023-06-02 ENCOUNTER — Ambulatory Visit: Payer: Medicare HMO | Attending: Cardiology | Admitting: Cardiology

## 2023-06-02 VITALS — BP 140/64 | HR 64 | Ht 72.0 in | Wt 151.0 lb

## 2023-06-02 DIAGNOSIS — I10 Essential (primary) hypertension: Secondary | ICD-10-CM

## 2023-06-02 DIAGNOSIS — I25119 Atherosclerotic heart disease of native coronary artery with unspecified angina pectoris: Secondary | ICD-10-CM

## 2023-06-02 DIAGNOSIS — T466X5D Adverse effect of antihyperlipidemic and antiarteriosclerotic drugs, subsequent encounter: Secondary | ICD-10-CM

## 2023-06-02 DIAGNOSIS — I7123 Aneurysm of the descending thoracic aorta, without rupture: Secondary | ICD-10-CM

## 2023-06-02 DIAGNOSIS — I2510 Atherosclerotic heart disease of native coronary artery without angina pectoris: Secondary | ICD-10-CM

## 2023-06-02 DIAGNOSIS — E785 Hyperlipidemia, unspecified: Secondary | ICD-10-CM

## 2023-06-02 DIAGNOSIS — G720 Drug-induced myopathy: Secondary | ICD-10-CM | POA: Diagnosis not present

## 2023-06-02 DIAGNOSIS — I2109 ST elevation (STEMI) myocardial infarction involving other coronary artery of anterior wall: Secondary | ICD-10-CM | POA: Diagnosis not present

## 2023-06-02 DIAGNOSIS — Z720 Tobacco use: Secondary | ICD-10-CM

## 2023-06-02 DIAGNOSIS — J069 Acute upper respiratory infection, unspecified: Secondary | ICD-10-CM

## 2023-06-02 DIAGNOSIS — I255 Ischemic cardiomyopathy: Secondary | ICD-10-CM

## 2023-06-02 MED ORDER — NITROGLYCERIN 0.4 MG SL SUBL: 0.4000 mg | SUBLINGUAL_TABLET | SUBLINGUAL | 4 refills | Status: AC | PRN

## 2023-06-02 NOTE — Patient Instructions (Signed)
 Medication Instructions:  No changes  *If you need a refill on your cardiac medications before your next appointment, please call your pharmacy*   Lab Work:  please do when you have labs done at [facility] Lipid [id number] If you have labs (blood work) drawn today and your tests are completely normal, you will receive your results only by: MyChart Message (if you have MyChart) OR A paper copy in the mail If you have any lab test that is abnormal or we need to change your treatment, we will call you to review the results.   Testing/Procedures:  Not needed  Follow-Up: At [facility], you and your health needs are our priority.  As part of our continuing mission to provide you with exceptional heart care, we have created designated Provider Care Teams.  These Care Teams include your primary Cardiologist (physician) and Advanced Practice Providers (APPs -  Physician Assistants and Nurse Practitioners) who all work together to provide you with the care you need, when you need it.     Your next appointment:    7 to 8 month(s)  The format for your next appointment:   In Person  Provider:   Bryan Lemma, MD    Your physician recommends that you schedule a follow-up appointment in:  after March 21,2025 -late March/ April 2025- discuss lipid treatment options  Other Instructions

## 2023-06-02 NOTE — Progress Notes (Unsigned)
 Cardiology Office Note:  .   Date:  06/04/2023  ID:  [name], DOB 14-Oct-1945, MRN [id number] PCP: Langley Gauss, MD  [facility] Providers Cardiologist:  Bryan Lemma, MD { Vascular Surgeon: Dr. Pattricia Boss [facility]) Oncologist: Dr. Arlan Organ  Chief Complaint  Patient presents with   Follow-up    4 months - no angina   Coronary Artery Disease    6 stable, with no angina.   Cardiomyopathy    No significant CHF symptoms either.    Patient Profile: .     Patrick Adams is a 78 y.o. male ongoing longer with a complex PMH reviewed below who presents here for 22-month follow-up at the request of Toborg, Kelby Aline, MD.  PMH:  CAD: Anterior STEMI January 31, 2018: LAD PCI with staged RCA PCI; apical LAD 70% too long for PCI. Plan for long-term Plavix ~ICM with EF improving up to 45-50%, mid to apical anterior and anteroseptal moderate hypokinesis consistent with LAD infarct. 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 Pattricia Boss 05/09/19: TEVAR with [place] 34 x 34 x 209 mm due to type 1a endoleak with Dr. Coralee Rud 05/12/19: TEVAR proximal cuff extension with Gore 37 mm x 15 cm due to type III endoleak with Dr. Coralee Rud. 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 Pattricia Boss.  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  Device healed at distal aortobifem. => Discussing options of conservative management versus medium invasive repair Hyperlipidemia-very poorly controlled.  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 Myna Hidalgo May 22, 2023: Noted feeling better pain better controlled.  Had finished a round of prednisone for couple days because of pain and lack of sleep.  Now on Lyrica and Lidoderm patch.  Appetite improved with some mild constipation -weight stabilized.  Still wheezing. Heritage hemochromatosis    Noe Goyer Souter was most recently seen by Joni Reining, NP on 02/25/2023: Denied any active cardiac issues.  No chest pain palpitations or significant dyspnea changes.  Does have tenuous breathing but more related to his lung disease.  Mostly noting XRT related symptoms.  Subjective  Discussed the use of AI scribe software for clinical note transcription with the patient, who gave verbal consent to proceed.  History of Present Illness   Patrick Adams is a 78 year old male with vascular disease and coronary artery disease who presents for follow-up on his cardiovascular health and management of cholesterol levels.  He has a significant history of vascular disease, including issues with the aorta and iliac arteries, and has undergone procedures at [facility]. He mentions a current aneurysm that requires a stent, and he is awaiting further instructions from [facility] regarding this. No chest pain, pressure, or tightness, and no episodes of dizziness, syncope, or neurological symptoms such as slurred speech or unilateral weakness.  He has not had his cholesterol levels checked in a long time and previously discontinued Praluent injections due to adverse effects similar to those experienced with statins, including pain. He stopped these injections over a year ago. He is due for routine lab work, including  a lipid panel, to monitor overall health and manage chronic conditions.  He is recovering from a recent sinus infection that has led to wheezing and a cough. The cough has improved, and the nasal discharge, previously green, yellow, and brown, has also improved. He experiences wheezing, particularly in the right lung, where he has  a history of cancer. Blowing his nose helps clear some of the congestion, but he is not coughing up any sputum.  He has been attempting to quit smoking and has not smoked for the past twelve hours, noting that smoking now causes dizziness.  His blood pressure at home typically runs around 125-130 mmHg, though it was noted to be higher during this visit. He attributes this to possible medication effects or other factors. He takes Tylenol frequently.      Cardiovascular ROS: positive for - dyspnea on exertion, shortness of breath, and with cough and wheezing worse because of ongoing URI. negative for - edema, irregular heartbeat, orthopnea, palpitations, paroxysmal nocturnal dyspnea, rapid heart rate, or syncope/near-syncope or TIA/amaurosis fugax or claudication  ROS:  Review of Systems - Negative except symptoms noted above    Objective   Medications: CV: Plavix 75 mg daily; losartan 100 mg daily Non- CV: Lyrica 75 mg daily, as needed Viagra, ibuprofen.  Studies Reviewed: .       Result Cath December 2019: Patent RCA (long proximal to mid segment) and LAD (proximal to mid) stents with stable 70% apical LAD    Lab Results  Component Value Date   CHOL 204 (H) 04/11/2021   HDL 46 04/11/2021   LDLCALC 132 (H) 04/11/2021   TRIG 146 04/11/2021   CHOLHDL 4.4 04/11/2021   Lab Results  Component Value Date   NA 137 05/22/2023   CL 100 05/22/2023   K 3.7 05/22/2023   CO2 30 05/22/2023   BUN 19 05/22/2023   CREATININE 1.13 05/22/2023   GFRNONAA >60 05/22/2023   CALCIUM 10.2 05/22/2023   PHOS 3.9 05/21/2019   ALBUMIN 3.9 05/22/2023   GLUCOSE 104 (H) 05/22/2023    Risk Assessment/Calculations:          Physical Exam:   VS:  BP (!) 140/64   Pulse 64   Ht 6' (1.829 m)   Wt 151 lb (68.5 kg)   SpO2 96%   BMI 20.48 kg/m    Wt Readings from Last 3 Encounters:  06/02/23 151 lb (68.5 kg)  05/22/23 148 lb (67.1 kg)  05/12/23 148 lb 1.9 oz (67.2 kg)    GEN: Well nourished,  well groomed; in no acute distress; notable weight loss (formerly obese) NECK: No JVD; soft bilateral carotid bruits CARDIAC: RRR, Normal but diminished S1, S2; no murmurs, rubs, gallops RESPIRATORY: Diminished breath sounds with diffuse rhonchi bilaterally.  Nonlabored, diminished air movement. ABDOMEN: Soft, non-tender, non-distended EXTREMITIES:  No edema; No deformity ; diminished pedal pulses bilaterally; abnormal gait     ASSESSMENT AND PLAN: .    Problem List Items Addressed This Visit       Cardiology Problems   Coronary artery disease involving native coronary artery of native heart without angina pectoris - Primary (Chronic)   Significant two-vessel disease with modest disease in an OM branch as well as distal LAD but the stents were patent in the LAD and RCA.  Doing relatively well with no ongoing anginal symptoms. -On essentially lifelong Plavix 75 mg daily; okay to hold 5 to 7 days preop for surgeries or procedures. -Not on beta-blocker because of smoking  issues and lung issues as well as heart rate of 64 bpm, it is on losartan 100 mg daily for afterload reduction -Unfortunately, he is not on nothing for lipids.  I was not aware that he had stopped his Praluent.  He has not had lipids checked in a long time we will therefore recheck lipids and have him follow-up with lipid clinic to consider possible inclisiran. -Smoke cessation counseling      Relevant Medications   nitroGLYCERIN (NITROSTAT) 0.4 MG SL tablet   Essential hypertension (Chronic)   Blood pressure slightly elevated during visit, but typically runs 125-130 at home. -Consider adding HCTZ if blood pressure remains elevated on subsequent visits.      Relevant Medications   nitroGLYCERIN (NITROSTAT) 0.4 MG SL tablet   Hyperlipidemia with target low density lipoprotein (LDL) cholesterol less than 55 mg/dL (Chronic)   History of intolerance to statins and Praluent. Unfortunately, he really did not take Praluent  very long.  Lipids were very poorly controlled on last check but have not been checked for over 2 years. High risk due to vascular disease. -Check lipid panel on 06/19/2023 during scheduled labs. -Consider Depo shot for cholesterol management, pending lipid panel results.       Relevant Medications   nitroGLYCERIN (NITROSTAT) 0.4 MG SL tablet   Ischemic cardiomyopathy (Chronic)   EF was 50 to 55% on [facility] echo and is not having any active anginal symptoms.  May be NYHA class IIa symptoms but the dyspnea is probably more related to COPD.  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  Doing fairly well with no active angina or heart failure symptoms.  EF improved to 45-50% post PCI of LAD and staged RCA. GDMT limited in the past although we may have some room to further titrate medications.      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 Pattricia Boss from [facility].  Remains on Plavix.  Potentially has upcoming procedure.  Safe to proceed as he is having no active anginal symptoms.      Relevant Medications   nitroGLYCERIN (NITROSTAT) 0.4 MG SL tablet     Other   Statin myopathy (Chronic)   Intolerant of multiple different statins including atorvastatin, simvastatin and rosuvastatin.  Also noted similar symptoms with Praluent. He said he took it a couple times and had symptoms.  I am not sure if he really gave it a good effort.  Plan to recheck lipids and refer back to CVRR lipid clinic.  It weekly to consider converting to Repatha or simply just  going to inclisiran which would probably be a better option.      Tobacco abuse (Chronic)   In the past he was not willing to TAVR smoking cessation, but is recently indicated that he would be interested in talking about smoking cessation.  Pretty much in a contemplative stage We talked about potentially using patches.  Unfortunately Chantix is no longer in favor. He said he is hoping to use this ongoing URI as an opportunity to quit.  His  last cigarette was 12 hours ago and he is hoping to continue to abstain. -Encouraged to continue cessation efforts.      URI (upper respiratory infection)   .Recent sinus infection with postnasal drainage and wheezing. Somewhat complicated by the fact that he has pretty significant lung disease.  Improvement in symptoms noted. -Continue current management.        Follow-up Plan to see patient in lipid clinic in approximately one month,  Return in about 1 month (around 07/03/2023) for Cholesterol F/u with CVRR, then 6 month follow-up with me.      Signed, Marykay Lex, MD, MS Bryan Lemma, M.D., M.S. Interventional Cardiologist  [facility]  Pager # [phone] Phone # [phone] [address] [place], [place] [address]

## 2023-06-04 ENCOUNTER — Encounter: Payer: Self-pay | Admitting: Cardiology

## 2023-06-04 DIAGNOSIS — J069 Acute upper respiratory infection, unspecified: Secondary | ICD-10-CM | POA: Insufficient documentation

## 2023-06-04 NOTE — Assessment & Plan Note (Signed)
 Blood pressure slightly elevated during visit, but typically runs 125-130 at home. -Consider adding HCTZ if blood pressure remains elevated on subsequent visits.

## 2023-06-04 NOTE — Assessment & Plan Note (Signed)
 Follow-up with Dr. Pattricia Boss from [facility].  Remains on Plavix.  Potentially has upcoming procedure.  Safe to proceed as he is having no active anginal symptoms.

## 2023-06-04 NOTE — Assessment & Plan Note (Signed)
.  Recent sinus infection with postnasal drainage and wheezing. Somewhat complicated by the fact that he has pretty significant lung disease.  Improvement in symptoms noted. -Continue current management.

## 2023-06-04 NOTE — Assessment & Plan Note (Signed)
 Just over 5 years out from anterior STEMI with LAD PCI.  Doing fairly well with no active angina or heart failure symptoms.  EF improved to 45-50% post PCI of LAD and staged RCA. GDMT limited in the past although we may have some room to further titrate medications.

## 2023-06-04 NOTE — Assessment & Plan Note (Addendum)
 In the past he was not willing to TAVR smoking cessation, but is recently indicated that he would be interested in talking about smoking cessation.  Pretty much in a contemplative stage We talked about potentially using patches.  Unfortunately Chantix is no longer in favor. He said he is hoping to use this ongoing URI as an opportunity to quit.  His last cigarette was 12 hours ago and he is hoping to continue to abstain. -Encouraged to continue cessation efforts.

## 2023-06-04 NOTE — Assessment & Plan Note (Signed)
 Intolerant of multiple different statins including atorvastatin, simvastatin and rosuvastatin.  Also noted similar symptoms with Praluent. He said he took it a couple times and had symptoms.  I am not sure if he really gave it a good effort.  Plan to recheck lipids and refer back to CVRR lipid clinic.  It weekly to consider converting to Repatha or simply just going to inclisiran which would probably be a better option.

## 2023-06-04 NOTE — Assessment & Plan Note (Addendum)
 Significant two-vessel disease with modest disease in an OM branch as well as distal LAD but the stents were patent in the LAD and RCA.  Doing relatively well with no ongoing anginal symptoms. -On essentially lifelong Plavix 75 mg daily; okay to hold 5 to 7 days preop for surgeries or procedures. -Not on beta-blocker because of smoking issues and lung issues as well as heart rate of 64 bpm, it is on losartan 100 mg daily for afterload reduction -Unfortunately, he is not on nothing for lipids.  I was not aware that he had stopped his Praluent.  He has not had lipids checked in a long time we will therefore recheck lipids and have him follow-up with lipid clinic to consider possible inclisiran. -Smoke cessation counseling

## 2023-06-04 NOTE — Assessment & Plan Note (Signed)
 EF was 50 to 55% on [facility] echo and is not having any active anginal symptoms.  May be NYHA class IIa symptoms but the dyspnea is probably more related to COPD.  Now on 100 mg daily losartan, will monitor blood pressures closely and may want to potentially add additional therapy with either amlodipine or diuretic

## 2023-06-04 NOTE — Assessment & Plan Note (Signed)
 History of intolerance to statins and Praluent. Unfortunately, he really did not take Praluent very long.  Lipids were very poorly controlled on last check but have not been checked for over 2 years. High risk due to vascular disease. -Check lipid panel on 06/19/2023 during scheduled labs. -Consider Depo shot for cholesterol management, pending lipid panel results.

## 2023-06-10 ENCOUNTER — Inpatient Hospital Stay: Payer: Medicare HMO | Attending: Hematology & Oncology | Admitting: Dietician

## 2023-06-10 DIAGNOSIS — C3411 Malignant neoplasm of upper lobe, right bronchus or lung: Secondary | ICD-10-CM | POA: Insufficient documentation

## 2023-06-10 DIAGNOSIS — Z79899 Other long term (current) drug therapy: Secondary | ICD-10-CM | POA: Insufficient documentation

## 2023-06-10 DIAGNOSIS — F17200 Nicotine dependence, unspecified, uncomplicated: Secondary | ICD-10-CM | POA: Insufficient documentation

## 2023-06-10 DIAGNOSIS — C7951 Secondary malignant neoplasm of bone: Secondary | ICD-10-CM | POA: Insufficient documentation

## 2023-06-10 NOTE — Progress Notes (Signed)
 Nutrition Follow Up:  Reached out to patient at his home telephone #.  He reports he is at day 10 that he quit smoking. Still little lung congestions, but he is eating everything in sight.  Pudding, crackers, still drinking a Boost each day.  Has balanced meal. Grilled chicken mashed potatoes, green beans.  Feels like he lost a bunch of muscle when he lost weight and although he is eating constantly he's discouraged he isn't regaining very fast.  He weighs himself daily at home. Bowels sluggish but managing with MiraLax.  Anthropometrics: weight increased 3# past 2 weeks during time he quit smoking  Height: 72" Weight:  06/02/23  151# 2/21/25148# UBW: 145-150# past year BMI: 20.48  NUTRITION DIAGNOSIS: Inadequate PO intake to meet increased nutrient needs, r/t recent diverticulitis flare. Improved with greater PO and some weight gain.   INTERVENTION:   Congratulated on smoking cessation.  Encouraged replacing PO with healthier snacks and choices to align with his desire to better health. (Popcorn, frozen fruits, bean dips with whole grain crackers)  Encouraged lean proteins and legumes to aid in restoration of LBM.  Relayed legumes as better choice for heart disease risk reduction, constipation, got maintenance and improved microbiota.  Relayed that if appetite is good and he's eating nutrient dense foods may not need to continue with ONS.  Encouraged him to weigh weekly to monitor progress.  MONITORING, EVALUATION, GOAL: weight, PO intake, Nutrition Impact Symptoms, labs  Goal is weight maintenance with regain of LBM  Next Visit: PRN at patient or provider request  Patrick Adams, RDN, LDN Registered Dietitian, [facility] Part Time Remote (Usual office hours: Tuesday-Thursday) Cell: [phone]

## 2023-06-19 ENCOUNTER — Inpatient Hospital Stay: Payer: Medicare HMO

## 2023-06-19 ENCOUNTER — Other Ambulatory Visit (HOSPITAL_BASED_OUTPATIENT_CLINIC_OR_DEPARTMENT_OTHER): Payer: Self-pay

## 2023-06-19 ENCOUNTER — Encounter: Payer: Self-pay | Admitting: Hematology & Oncology

## 2023-06-19 ENCOUNTER — Other Ambulatory Visit: Payer: Self-pay

## 2023-06-19 ENCOUNTER — Inpatient Hospital Stay: Payer: Medicare HMO | Admitting: Hematology & Oncology

## 2023-06-19 VITALS — BP 135/55 | HR 72 | Temp 98.0°F | Resp 18 | Ht 72.0 in | Wt 158.0 lb

## 2023-06-19 DIAGNOSIS — C7951 Secondary malignant neoplasm of bone: Secondary | ICD-10-CM

## 2023-06-19 DIAGNOSIS — C3411 Malignant neoplasm of upper lobe, right bronchus or lung: Secondary | ICD-10-CM | POA: Diagnosis present

## 2023-06-19 DIAGNOSIS — C3490 Malignant neoplasm of unspecified part of unspecified bronchus or lung: Secondary | ICD-10-CM

## 2023-06-19 DIAGNOSIS — F17200 Nicotine dependence, unspecified, uncomplicated: Secondary | ICD-10-CM | POA: Diagnosis not present

## 2023-06-19 DIAGNOSIS — Z79899 Other long term (current) drug therapy: Secondary | ICD-10-CM | POA: Diagnosis not present

## 2023-06-19 LAB — CBC WITH DIFFERENTIAL (CANCER CENTER ONLY)
Abs Immature Granulocytes: 0.02 10*3/uL (ref 0.00–0.07)
Basophils Absolute: 0 10*3/uL (ref 0.0–0.1)
Basophils Relative: 0 %
Eosinophils Absolute: 0.2 10*3/uL (ref 0.0–0.5)
Eosinophils Relative: 3 %
HCT: 37.4 % — ABNORMAL LOW (ref 39.0–52.0)
Hemoglobin: 12.6 g/dL — ABNORMAL LOW (ref 13.0–17.0)
Immature Granulocytes: 0 %
Lymphocytes Relative: 15 %
Lymphs Abs: 0.8 10*3/uL (ref 0.7–4.0)
MCH: 31.2 pg (ref 26.0–34.0)
MCHC: 33.7 g/dL (ref 30.0–36.0)
MCV: 92.6 fL (ref 80.0–100.0)
Monocytes Absolute: 0.5 10*3/uL (ref 0.1–1.0)
Monocytes Relative: 9 %
Neutro Abs: 4.1 10*3/uL (ref 1.7–7.7)
Neutrophils Relative %: 73 %
Platelet Count: 207 10*3/uL (ref 150–400)
RBC: 4.04 MIL/uL — ABNORMAL LOW (ref 4.22–5.81)
RDW: 12.5 % (ref 11.5–15.5)
WBC Count: 5.7 10*3/uL (ref 4.0–10.5)
nRBC: 0 % (ref 0.0–0.2)

## 2023-06-19 LAB — CMP (CANCER CENTER ONLY)
ALT: 10 U/L (ref 0–44)
AST: 11 U/L — ABNORMAL LOW (ref 15–41)
Albumin: 4 g/dL (ref 3.5–5.0)
Alkaline Phosphatase: 105 U/L (ref 38–126)
Anion gap: 10 (ref 5–15)
BUN: 34 mg/dL — ABNORMAL HIGH (ref 8–23)
CO2: 25 mmol/L (ref 22–32)
Calcium: 9.8 mg/dL (ref 8.9–10.3)
Chloride: 102 mmol/L (ref 98–111)
Creatinine: 1.26 mg/dL — ABNORMAL HIGH (ref 0.61–1.24)
GFR, Estimated: 59 mL/min — ABNORMAL LOW (ref >60)
Glucose, Bld: 131 mg/dL — ABNORMAL HIGH (ref 70–99)
Potassium: 4.3 mmol/L (ref 3.5–5.1)
Sodium: 137 mmol/L (ref 135–145)
Total Bilirubin: 0.5 mg/dL (ref 0.0–1.2)
Total Protein: 6.5 g/dL (ref 6.5–8.1)

## 2023-06-19 LAB — LACTATE DEHYDROGENASE: LDH: 127 U/L (ref 98–192)

## 2023-06-19 MED ORDER — PREGABALIN 100 MG PO CAPS
100.0000 mg | ORAL_CAPSULE | Freq: Two times a day (BID) | ORAL | 0 refills | Status: DC
  Filled 2023-06-19: qty 60, 30d supply, fill #0

## 2023-06-19 NOTE — Progress Notes (Signed)
 Hematology and Oncology Follow Up Visit  Patrick Adams [id number] [birth year] 78 y.o. 06/19/2023   Principle Diagnosis:  Metastatic non-small cell lung cancer-adenocarcinoma. --- NO actionable mutations  Current Therapy:   Xgeva 120 mg subcu every 3 months-next dose in 05/2023  XRT to the right upper lobe nodule -definitive treatment on 03/09/2023     Interim History:  Patrick Adams is back for a follow-up.  He looks a whole lot better.  He feels a whole lot better.  I am so happy that the pain is doing well for him.  He is using Lyrica.  He is also using the Lidoderm patches.  He is trying to stop smoking.  He stopped for 18 days.  He then started back up.  He is going to stop for 18 more days after today.  He has had no nausea or vomiting.  He is gaining some weight.  Is eating well.  He has had no change in bowel or bladder habits.  He has had no leg swelling.  He has had no rashes.  There has been no bleeding.  Overall, I would say that his performance status is probably ECOG 1.    Medications:  Current Outpatient Medications:    acetaminophen (TYLENOL) 500 MG tablet, Take 1,000 mg by mouth every 6 (six) hours as needed for moderate pain or headache., Disp: , Rfl:    cholecalciferol (VITAMIN D3) 25 MCG (1000 UT) tablet, Take 1,000 Units by mouth daily., Disp: , Rfl:    clopidogrel (PLAVIX) 75 MG tablet, Take 1 tablet (75 mg total) by mouth daily., Disp: 90 tablet, Rfl: 3   ibuprofen (ADVIL) 200 MG tablet, Take 200 mg by mouth 2 (two) times daily. As needed, Disp: , Rfl:    lidocaine (LIDODERM) 5 %, Place 1 patch onto the skin daily. Remove & Discard patch within 12 hours or as directed by MD, Disp: 30 patch, Rfl: 4   losartan (COZAAR) 100 MG tablet, Take 1 tablet (100 mg total) by mouth daily., Disp: 90 tablet, Rfl: 3   nitroGLYCERIN (NITROSTAT) 0.4 MG SL tablet, Place 1 tablet (0.4 mg total) under the tongue every 5 (five) minutes as needed for chest pain., Disp: 25 tablet, Rfl: 4    polyethylene glycol (MIRALAX / GLYCOLAX) 17 g packet, Take 17 g by mouth daily as needed (Constipation)., Disp: 14 each, Rfl: 0   pregabalin (LYRICA) 75 MG capsule, Take 1 capsule (75 mg total) by mouth 2 (two) times daily., Disp: 60 capsule, Rfl: 0   sildenafil (VIAGRA) 100 MG tablet, Take 1 tablet (100 mg total) by mouth daily as needed for erectile dysfunction., Disp: 10 tablet, Rfl: 6  Allergies:  Allergies  Allergen Reactions   Brilinta [Ticagrelor] Shortness Of Breath     Patient was taking 60 mg  Twice a day - changed to Clopidogrel 75 mg  03/02/19   Ciprofloxacin Anaphylaxis   Statins Other (See Comments)    Joint pain.Myalgias -- HAS TRIED LIPITOR, ZOCOR & CRESTOR    Past Medical History, Surgical history, Social history, and Family History were reviewed and updated.  Review of Systems: Review of Systems  Constitutional:  Positive for unexpected weight change.  HENT:  Negative.    Eyes: Negative.   Respiratory: Negative.    Cardiovascular:  Positive for chest pain.  Gastrointestinal: Negative.   Endocrine: Negative.   Genitourinary: Negative.    Musculoskeletal: Negative.   Skin: Negative.   Neurological: Negative.   Hematological: Negative.   Psychiatric/Behavioral:  Negative.      Physical Exam: Vital signs show temperature 98.  Pulse 72.  Blood pressure 135/55.  Weight is 158 pounds.  Wt Readings from Last 3 Encounters:  06/02/23 151 lb (68.5 kg)  05/22/23 148 lb (67.1 kg)  05/12/23 148 lb 1.9 oz (67.2 kg)    Physical Exam Vitals reviewed.  HENT:     Head: Normocephalic and atraumatic.  Eyes:     Pupils: Pupils are equal, round, and reactive to light.  Cardiovascular:     Rate and Rhythm: Normal rate and regular rhythm.     Heart sounds: Normal heart sounds.  Pulmonary:     Effort: Pulmonary effort is normal.     Breath sounds: Normal breath sounds.  Abdominal:     General: Bowel sounds are normal.     Palpations: Abdomen is soft.  Musculoskeletal:         General: No tenderness or deformity. Normal range of motion.     Cervical back: Normal range of motion.     Comments: He does have a little bit of tenderness in the right lateral lower rib cage.  No masses are noted.  There is no swelling as noted.  Lymphadenopathy:     Cervical: No cervical adenopathy.  Skin:    General: Skin is warm and dry.     Findings: No erythema or rash.  Neurological:     Mental Status: He is alert and oriented to person, place, and time.  Psychiatric:        Behavior: Behavior normal.        Thought Content: Thought content normal.        Judgment: Judgment normal.      Lab Results  Component Value Date   WBC 5.7 06/19/2023   HGB 12.6 (L) 06/19/2023   HCT 37.4 (L) 06/19/2023   MCV 92.6 06/19/2023   PLT 207 06/19/2023     Chemistry      Component Value Date/Time   NA 137 05/22/2023 0834   NA 138 04/11/2021 1130   K 3.7 05/22/2023 0834   CL 100 05/22/2023 0834   CO2 30 05/22/2023 0834   BUN 19 05/22/2023 0834   BUN 20 04/11/2021 1130   CREATININE 1.13 05/22/2023 0834      Component Value Date/Time   CALCIUM 10.2 05/22/2023 0834   ALKPHOS 96 05/22/2023 0834   AST 9 (L) 05/22/2023 0834   ALT 8 05/22/2023 0834   BILITOT 0.4 05/22/2023 0834       Impression and Plan: Patrick Adams is a very nice 78 year old white male.  He has a history of adenocarcinoma of the lung.  He seemed to have an area of disease on MRI that was in the right eighth and 10th rib.  I think he had radiation for this as completed in February.  He has completed radiotherapy to his right upper lung.  I am happy that his pain is under better control.  We will increase his Lyrica a little bit.  This may help a little bit more with the pain control.  Hopefully, he will continue to stop smoking.  We will plan for a PET scan to be done in about a month.  I will see him back in about 6 weeks.     Josph Macho, MD 3/21/20258:00 AM

## 2023-07-17 ENCOUNTER — Other Ambulatory Visit (HOSPITAL_BASED_OUTPATIENT_CLINIC_OR_DEPARTMENT_OTHER): Payer: Self-pay

## 2023-07-17 ENCOUNTER — Other Ambulatory Visit: Payer: Self-pay | Admitting: *Deleted

## 2023-07-17 MED ORDER — PREGABALIN 100 MG PO CAPS
100.0000 mg | ORAL_CAPSULE | Freq: Two times a day (BID) | ORAL | 0 refills | Status: DC
  Filled 2023-07-17: qty 60, 30d supply, fill #0

## 2023-07-20 ENCOUNTER — Encounter (HOSPITAL_COMMUNITY)
Admission: RE | Admit: 2023-07-20 | Discharge: 2023-07-20 | Disposition: A | Discharge status: Home / Self Care | Source: Ambulatory Visit | Attending: Hematology & Oncology | Admitting: Hematology & Oncology

## 2023-07-20 DIAGNOSIS — C3490 Malignant neoplasm of unspecified part of unspecified bronchus or lung: Secondary | ICD-10-CM | POA: Insufficient documentation

## 2023-07-20 DIAGNOSIS — C7951 Secondary malignant neoplasm of bone: Secondary | ICD-10-CM | POA: Insufficient documentation

## 2023-07-20 LAB — GLUCOSE, CAPILLARY: Glucose-Capillary: 92 mg/dL (ref 70–99)

## 2023-07-20 MED ORDER — FLUDEOXYGLUCOSE F - 18 (FDG) INJECTION
7.8600 | Freq: Once | INTRAVENOUS | Status: AC
  Administered 2023-07-20: 7.86 via INTRAVENOUS

## 2023-07-21 ENCOUNTER — Ambulatory Visit: Admitting: Pharmacist

## 2023-07-24 ENCOUNTER — Telehealth: Payer: Self-pay

## 2023-07-24 NOTE — Telephone Encounter (Signed)
 Advised via MyChart.

## 2023-07-24 NOTE — Telephone Encounter (Signed)
-----   Message from [name] sent at 07/24/2023  3:13 PM EDT ----- Please call and let him know that the PET scan does not show any obvious active lung cancer.Patrick Adams

## 2023-07-31 ENCOUNTER — Inpatient Hospital Stay

## 2023-07-31 ENCOUNTER — Inpatient Hospital Stay: Admitting: Hematology & Oncology

## 2023-07-31 ENCOUNTER — Inpatient Hospital Stay: Attending: Hematology & Oncology

## 2023-07-31 ENCOUNTER — Other Ambulatory Visit: Payer: Self-pay

## 2023-07-31 DIAGNOSIS — C3411 Malignant neoplasm of upper lobe, right bronchus or lung: Secondary | ICD-10-CM | POA: Diagnosis present

## 2023-07-31 DIAGNOSIS — C7951 Secondary malignant neoplasm of bone: Secondary | ICD-10-CM | POA: Diagnosis not present

## 2023-07-31 LAB — CMP (CANCER CENTER ONLY)
ALT: 7 U/L (ref 0–44)
AST: 12 U/L — ABNORMAL LOW (ref 15–41)
Albumin: 4 g/dL (ref 3.5–5.0)
Alkaline Phosphatase: 108 U/L (ref 38–126)
Anion gap: 8 (ref 5–15)
BUN: 20 mg/dL (ref 8–23)
CO2: 27 mmol/L (ref 22–32)
Calcium: 9.3 mg/dL (ref 8.9–10.3)
Chloride: 104 mmol/L (ref 98–111)
Creatinine: 1.2 mg/dL (ref 0.61–1.24)
GFR, Estimated: >60 mL/min (ref >60)
Glucose, Bld: 91 mg/dL (ref 70–99)
Potassium: 3.9 mmol/L (ref 3.5–5.1)
Sodium: 139 mmol/L (ref 135–145)
Total Bilirubin: 0.5 mg/dL (ref 0.0–1.2)
Total Protein: 6.7 g/dL (ref 6.5–8.1)

## 2023-07-31 LAB — IRON AND IRON BINDING CAPACITY (CC-WL,HP ONLY)
Iron: 39 ug/dL — ABNORMAL LOW (ref 45–182)
Saturation Ratios: 19 % (ref 17.9–39.5)
TIBC: 210 ug/dL — ABNORMAL LOW (ref 250–450)
UIBC: 171 ug/dL (ref 117–376)

## 2023-07-31 LAB — CBC WITH DIFFERENTIAL (CANCER CENTER ONLY)
Abs Immature Granulocytes: 0.08 10*3/uL — ABNORMAL HIGH (ref 0.00–0.07)
Basophils Absolute: 0 10*3/uL (ref 0.0–0.1)
Basophils Relative: 0 %
Eosinophils Absolute: 0.1 10*3/uL (ref 0.0–0.5)
Eosinophils Relative: 2 %
HCT: 37.6 % — ABNORMAL LOW (ref 39.0–52.0)
Hemoglobin: 12.7 g/dL — ABNORMAL LOW (ref 13.0–17.0)
Immature Granulocytes: 1 %
Lymphocytes Relative: 11 %
Lymphs Abs: 0.7 10*3/uL (ref 0.7–4.0)
MCH: 30.8 pg (ref 26.0–34.0)
MCHC: 33.8 g/dL (ref 30.0–36.0)
MCV: 91 fL (ref 80.0–100.0)
Monocytes Absolute: 0.5 10*3/uL (ref 0.1–1.0)
Monocytes Relative: 7 %
Neutro Abs: 5.2 10*3/uL (ref 1.7–7.7)
Neutrophils Relative %: 79 %
Platelet Count: 248 10*3/uL (ref 150–400)
RBC: 4.13 MIL/uL — ABNORMAL LOW (ref 4.22–5.81)
RDW: 12.7 % (ref 11.5–15.5)
WBC Count: 6.6 10*3/uL (ref 4.0–10.5)
nRBC: 0 % (ref 0.0–0.2)

## 2023-07-31 LAB — LACTATE DEHYDROGENASE: LDH: 136 U/L (ref 98–192)

## 2023-07-31 LAB — RETICULOCYTES
Immature Retic Fract: 11.7 % (ref 2.3–15.9)
RBC.: 4.16 MIL/uL — ABNORMAL LOW (ref 4.22–5.81)
Retic Count, Absolute: 61.6 10*3/uL (ref 19.0–186.0)
Retic Ct Pct: 1.5 % (ref 0.4–3.1)

## 2023-07-31 LAB — FERRITIN: Ferritin: 155 ng/mL (ref 24–336)

## 2023-07-31 MED ORDER — DENOSUMAB 120 MG/1.7ML ~~LOC~~ SOLN
120.0000 mg | Freq: Once | SUBCUTANEOUS | Status: AC
  Administered 2023-07-31: 120 mg via SUBCUTANEOUS
  Filled 2023-07-31: qty 1.7

## 2023-07-31 NOTE — Progress Notes (Signed)
 Hematology and Oncology Follow Up Visit  Patrick Adams [id number] Sep 02, 1945 78 y.o. 07/31/2023   Principle Diagnosis:  Metastatic non-small cell lung cancer-adenocarcinoma. --- NO actionable mutations  Current Therapy:   Xgeva  120 mg subcu every 3 months-next dose in 10/2023  XRT to the right upper lobe nodule -definitive treatment on 03/09/2023     Interim History:  Patrick Adams is back for a follow-up.  The problem that he has right now is that he has limited mobility of his right shoulder.  This happened after he he had some stents placed by vascular surgery into the iliac arteries at [facility].  After that, he had limited range of motion of the right shoulder.  He has weakness in the right arm.  I suspect this is by some kind of neurological compromise.  He was told that this will get better.  As far as his cancer is concerned, we did do a PET scan on him.  This was done on 07/20/2023.  The PET scan did not show any evidence of active malignancy.  Everything really looked fantastic on the PET scan.  He does get tired easily.  I think he is still smoking a little bit.  His appetite is okay.  He has had no nausea or vomiting.  He has had no bleeding.  There has been no change in bowel or bladder habits.  Overall, I would say that his performance status is probably ECOG 1.   Medications:  Current Outpatient Medications:    acetaminophen  (TYLENOL ) 500 MG tablet, Take 1,000 mg by mouth every 6 (six) hours as needed for moderate pain or headache., Disp: , Rfl:    cholecalciferol (VITAMIN D3) 25 MCG (1000 UT) tablet, Take 1,000 Units by mouth daily., Disp: , Rfl:    clopidogrel  (PLAVIX ) 75 MG tablet, Take 1 tablet (75 mg total) by mouth daily., Disp: 90 tablet, Rfl: 3   ibuprofen (ADVIL) 200 MG tablet, Take 200 mg by mouth 2 (two) times daily. As needed, Disp: , Rfl:    lidocaine  (LIDODERM ) 5 %, Place 1 patch onto the skin daily. Remove & Discard patch within 12 hours or as directed  by MD, Disp: 30 patch, Rfl: 4   losartan  (COZAAR ) 100 MG tablet, Take 1 tablet (100 mg total) by mouth daily., Disp: 90 tablet, Rfl: 3   nitroGLYCERIN  (NITROSTAT ) 0.4 MG SL tablet, Place 1 tablet (0.4 mg total) under the tongue every 5 (five) minutes as needed for chest pain., Disp: 25 tablet, Rfl: 4   polyethylene glycol (MIRALAX  / GLYCOLAX ) 17 g packet, Take 17 g by mouth daily as needed (Constipation)., Disp: 14 each, Rfl: 0   pregabalin  (LYRICA ) 100 MG capsule, Take 1 capsule (100 mg total) by mouth 2 (two) times daily., Disp: 60 capsule, Rfl: 0   sildenafil  (VIAGRA ) 100 MG tablet, Take 1 tablet (100 mg total) by mouth daily as needed for erectile dysfunction., Disp: 10 tablet, Rfl: 6  Allergies:  Allergies  Allergen Reactions   Brilinta  [Ticagrelor ] Shortness Of Breath     Patient was taking 60 mg  Twice a day - changed to Clopidogrel  75 mg  03/02/19   Ciprofloxacin Anaphylaxis   Statins Other (See Comments)    Joint pain.Myalgias -- HAS TRIED LIPITOR, ZOCOR & CRESTOR     Past Medical History, Surgical history, Social history, and Family History were reviewed and updated.  Review of Systems: Review of Systems  Constitutional:  Positive for unexpected weight change.  HENT:  Negative.  Eyes: Negative.   Respiratory: Negative.    Cardiovascular:  Positive for chest pain.  Gastrointestinal: Negative.   Endocrine: Negative.   Genitourinary: Negative.    Musculoskeletal: Negative.   Skin: Negative.   Neurological: Negative.   Hematological: Negative.   Psychiatric/Behavioral: Negative.      Physical Exam: Vital signs show temperature 98.  Pulse 72.  Blood pressure 135/55.  Weight is 158 pounds.  Wt Readings from Last 3 Encounters:  06/19/23 158 lb (71.7 kg)  06/02/23 151 lb (68.5 kg)  05/22/23 148 lb (67.1 kg)    Physical Exam Vitals reviewed.  HENT:     Head: Normocephalic and atraumatic.  Eyes:     Pupils: Pupils are equal, round, and reactive to light.   Cardiovascular:     Rate and Rhythm: Normal rate and regular rhythm.     Heart sounds: Normal heart sounds.  Pulmonary:     Effort: Pulmonary effort is normal.     Breath sounds: Normal breath sounds.  Abdominal:     General: Bowel sounds are normal.     Palpations: Abdomen is soft.  Musculoskeletal:        General: No tenderness or deformity. Normal range of motion.     Cervical back: Normal range of motion.     Comments: He does have limited range of motion of the right shoulder.  There is some weakness in the right arm.    Lymphadenopathy:     Cervical: No cervical adenopathy.  Skin:    General: Skin is warm and dry.     Findings: No erythema or rash.  Neurological:     Mental Status: He is alert and oriented to person, place, and time.  Psychiatric:        Behavior: Behavior normal.        Thought Content: Thought content normal.        Judgment: Judgment normal.      Lab Results  Component Value Date   WBC 6.6 07/31/2023   HGB 12.7 (L) 07/31/2023   HCT 37.6 (L) 07/31/2023   MCV 91.0 07/31/2023   PLT 248 07/31/2023     Chemistry      Component Value Date/Time   NA 139 07/31/2023 0917   NA 138 04/11/2021 1130   K 3.9 07/31/2023 0917   CL 104 07/31/2023 0917   CO2 27 07/31/2023 0917   BUN 20 07/31/2023 0917   BUN 20 04/11/2021 1130   CREATININE 1.20 07/31/2023 0917      Component Value Date/Time   CALCIUM  9.3 07/31/2023 0917   ALKPHOS 108 07/31/2023 0917   AST 12 (L) 07/31/2023 0917   ALT 7 07/31/2023 0917   BILITOT 0.5 07/31/2023 0917       Impression and Plan: Patrick Adams is a very nice 78 year old white male.  He has a history of adenocarcinoma of the lung.  He seemed to have an area of disease on MRI that was in the right eighth and 10th rib.  I think he had radiation for this as completed in February.  Again, his cancer is under good control.  He is in remission.  I am happy about this.  We probably do not have to do another PET scan on him for  several more months.  I told him to call me in 1 week.  If his right shoulder is no better, we will help with this.  Again, stopping smoking we will help him more than anything.  I will  see him back in about 6 weeks.     [name], MD 5/2/202510:20 AM

## 2023-07-31 NOTE — Patient Instructions (Signed)
 Denosumab Injection (Oncology) What is this medication? DENOSUMAB (den oh SUE mab) prevents weakened bones caused by cancer. It may also be used to treat noncancerous bone tumors that cannot be removed by surgery. It can also be used to treat high calcium levels in the blood caused by cancer. It works by blocking a protein that causes bones to break down quickly. This slows down the release of calcium from bones, which lowers calcium levels in your blood. It also makes your bones stronger and less likely to break (fracture). This medicine may be used for other purposes; ask your health care provider or pharmacist if you have questions. COMMON BRAND NAME(S): XGEVA What should I tell my care team before I take this medication? They need to know if you have any of these conditions: Dental disease Having surgery or tooth extraction Infection Kidney disease Low levels of calcium or vitamin D in the blood Malnutrition On hemodialysis Skin conditions or sensitivity Thyroid or parathyroid disease An unusual reaction to denosumab, other medications, foods, dyes, or preservatives Pregnant or trying to get pregnant Breast-feeding How should I use this medication? This medication is for injection under the skin. It is given by your care team in a hospital or clinic setting. A special MedGuide will be given to you before each treatment. Be sure to read this information carefully each time. Talk to your care team about the use of this medication in children. While it may be prescribed for children as young as 13 years for selected conditions, precautions do apply. Overdosage: If you think you have taken too much of this medicine contact a poison control center or emergency room at once. NOTE: This medicine is only for you. Do not share this medicine with others. What if I miss a dose? Keep appointments for follow-up doses. It is important not to miss your dose. Call your care team if you are unable to  keep an appointment. What may interact with this medication? Do not take this medication with any of the following: Other medications containing denosumab This medication may also interact with the following: Medications that lower your chance of fighting infection Steroid medications, such as prednisone or cortisone This list may not describe all possible interactions. Give your health care provider a list of all the medicines, herbs, non-prescription drugs, or dietary supplements you use. Also tell them if you smoke, drink alcohol, or use illegal drugs. Some items may interact with your medicine. What should I watch for while using this medication? Your condition will be monitored carefully while you are receiving this medication. You may need blood work while taking this medication. This medication may increase your risk of getting an infection. Call your care team for advice if you get a fever, chills, sore throat, or other symptoms of a cold or flu. Do not treat yourself. Try to avoid being around people who are sick. You should make sure you get enough calcium and vitamin D while you are taking this medication, unless your care team tells you not to. Discuss the foods you eat and the vitamins you take with your care team. Some people who take this medication have severe bone, joint, or muscle pain. This medication may also increase your risk for jaw problems or a broken thigh bone. Tell your care team right away if you have severe pain in your jaw, bones, joints, or muscles. Tell your care team if you have any pain that does not go away or that gets worse. Talk  to your care team if you may be pregnant. Serious birth defects can occur if you take this medication during pregnancy and for 5 months after the last dose. You will need a negative pregnancy test before starting this medication. Contraception is recommended while taking this medication and for 5 months after the last dose. Your care team  can help you find the option that works for you. What side effects may I notice from receiving this medication? Side effects that you should report to your care team as soon as possible: Allergic reactions--skin rash, itching, hives, swelling of the face, lips, tongue, or throat Bone, joint, or muscle pain Low calcium level--muscle pain or cramps, confusion, tingling, or numbness in the hands or feet Osteonecrosis of the jaw--pain, swelling, or redness in the mouth, numbness of the jaw, poor healing after dental work, unusual discharge from the mouth, visible bones in the mouth Side effects that usually do not require medical attention (report to your care team if they continue or are bothersome): Cough Diarrhea Fatigue Headache Nausea This list may not describe all possible side effects. Call your doctor for medical advice about side effects. You may report side effects to FDA at 1-800-FDA-1088. Where should I keep my medication? This medication is given in a hospital or clinic. It will not be stored at home. NOTE: This sheet is a summary. It may not cover all possible information. If you have questions about this medicine, talk to your doctor, pharmacist, or health care provider.  2024 Elsevier/Gold Standard (2021-08-07 00:00:00)

## 2023-08-06 ENCOUNTER — Encounter: Payer: Self-pay | Admitting: *Deleted

## 2023-08-06 LAB — HEMOCHROMATOSIS DNA-PCR(C282Y,H63D)

## 2023-08-18 ENCOUNTER — Other Ambulatory Visit: Payer: Self-pay

## 2023-08-18 ENCOUNTER — Encounter: Payer: Self-pay | Admitting: Hematology & Oncology

## 2023-08-18 ENCOUNTER — Other Ambulatory Visit (HOSPITAL_BASED_OUTPATIENT_CLINIC_OR_DEPARTMENT_OTHER): Payer: Self-pay

## 2023-08-18 MED ORDER — PREGABALIN 100 MG PO CAPS
100.0000 mg | ORAL_CAPSULE | Freq: Two times a day (BID) | ORAL | 0 refills | Status: DC
  Filled 2023-08-18: qty 60, 30d supply, fill #0

## 2023-08-18 MED ORDER — PREGABALIN 100 MG PO CAPS: 100.0000 mg | ORAL_CAPSULE | Freq: Two times a day (BID) | ORAL | 0 refills | Status: DC

## 2023-09-08 ENCOUNTER — Encounter: Payer: Self-pay | Admitting: Hematology & Oncology

## 2023-09-08 ENCOUNTER — Inpatient Hospital Stay: Attending: Hematology & Oncology

## 2023-09-08 ENCOUNTER — Other Ambulatory Visit: Payer: Self-pay

## 2023-09-08 ENCOUNTER — Inpatient Hospital Stay: Admitting: Hematology & Oncology

## 2023-09-08 VITALS — BP 104/53 | HR 70 | Temp 97.5°F | Resp 18 | Ht 72.0 in | Wt 156.0 lb

## 2023-09-08 DIAGNOSIS — C3490 Malignant neoplasm of unspecified part of unspecified bronchus or lung: Secondary | ICD-10-CM | POA: Diagnosis not present

## 2023-09-08 DIAGNOSIS — F17210 Nicotine dependence, cigarettes, uncomplicated: Secondary | ICD-10-CM | POA: Diagnosis not present

## 2023-09-08 DIAGNOSIS — C7951 Secondary malignant neoplasm of bone: Secondary | ICD-10-CM | POA: Diagnosis not present

## 2023-09-08 DIAGNOSIS — Z85118 Personal history of other malignant neoplasm of bronchus and lung: Secondary | ICD-10-CM | POA: Diagnosis present

## 2023-09-08 LAB — CBC WITH DIFFERENTIAL (CANCER CENTER ONLY)
Abs Immature Granulocytes: 0.02 10*3/uL (ref 0.00–0.07)
Basophils Absolute: 0 10*3/uL (ref 0.0–0.1)
Basophils Relative: 0 %
Eosinophils Absolute: 0.1 10*3/uL (ref 0.0–0.5)
Eosinophils Relative: 1 %
HCT: 39.1 % (ref 39.0–52.0)
Hemoglobin: 13.2 g/dL (ref 13.0–17.0)
Immature Granulocytes: 0 %
Lymphocytes Relative: 13 %
Lymphs Abs: 0.8 10*3/uL (ref 0.7–4.0)
MCH: 30.7 pg (ref 26.0–34.0)
MCHC: 33.8 g/dL (ref 30.0–36.0)
MCV: 90.9 fL (ref 80.0–100.0)
Monocytes Absolute: 0.5 10*3/uL (ref 0.1–1.0)
Monocytes Relative: 8 %
Neutro Abs: 5 10*3/uL (ref 1.7–7.7)
Neutrophils Relative %: 78 %
Platelet Count: 254 10*3/uL (ref 150–400)
RBC: 4.3 MIL/uL (ref 4.22–5.81)
RDW: 13 % (ref 11.5–15.5)
WBC Count: 6.4 10*3/uL (ref 4.0–10.5)
nRBC: 0 % (ref 0.0–0.2)

## 2023-09-08 LAB — CMP (CANCER CENTER ONLY)
ALT: 6 U/L (ref 0–44)
AST: 8 U/L — ABNORMAL LOW (ref 15–41)
Albumin: 4.1 g/dL (ref 3.5–5.0)
Alkaline Phosphatase: 111 U/L (ref 38–126)
Anion gap: 10 (ref 5–15)
BUN: 25 mg/dL — ABNORMAL HIGH (ref 8–23)
CO2: 26 mmol/L (ref 22–32)
Calcium: 10.1 mg/dL (ref 8.9–10.3)
Chloride: 103 mmol/L (ref 98–111)
Creatinine: 1.4 mg/dL — ABNORMAL HIGH (ref 0.61–1.24)
GFR, Estimated: 52 mL/min — ABNORMAL LOW (ref >60)
Glucose, Bld: 141 mg/dL — ABNORMAL HIGH (ref 70–99)
Potassium: 4.5 mmol/L (ref 3.5–5.1)
Sodium: 139 mmol/L (ref 135–145)
Total Bilirubin: 0.5 mg/dL (ref 0.0–1.2)
Total Protein: 7 g/dL (ref 6.5–8.1)

## 2023-09-08 LAB — LACTATE DEHYDROGENASE: LDH: 116 U/L (ref 98–192)

## 2023-09-08 NOTE — Progress Notes (Signed)
 Hematology and Oncology Follow Up Visit  Patrick Adams [id number] [birth year] 78 y.o. 09/08/2023   Principle Diagnosis:  Metastatic non-small cell lung cancer-adenocarcinoma. --- NO actionable mutations  Current Therapy:   Xgeva  120 mg subcu every 3 months-next dose in 10/2023  XRT to the right upper lobe nodule -definitive treatment on 03/09/2023     Interim History:  Patrick Adams is back for a follow-up.  The problem that he has right now is that he has limited mobility of his right shoulder.  This happened after he he had some stents placed by vascular surgery into the iliac arteries at [facility].  After that, he had limited range of motion of the right shoulder.  He has weakness in the right arm.  I suspect this is by some kind of neurological compromise.  He was told that this will get better.  So far, it is not gotten better.  He really needs to see Orthopedic Surgery.  I will see about making referral to Dr. Dalldorf.  Otherwise, he is doing okay.  He still smokes.  However, he is only smoking maybe 2 or 3 cigarettes a day.  He has had a good appetite.  He has had no nausea or vomiting.  He has had no change in bowel or bladder habits.  There has been no fever.  He has had no leg swelling.  He has had no rashes.  He has had no headache.  Overall, I would say that his performance status about ECOG 1.  Medications:  Current Outpatient Medications:    losartan  (COZAAR ) 100 MG tablet, Take 50 mg by mouth., Disp: , Rfl:    acetaminophen  (TYLENOL ) 500 MG tablet, Take 1,000 mg by mouth every 6 (six) hours as needed for moderate pain or headache., Disp: , Rfl:    cholecalciferol (VITAMIN D3) 25 MCG (1000 UT) tablet, Take 1,000 Units by mouth daily., Disp: , Rfl:    clopidogrel  (PLAVIX ) 75 MG tablet, Take 1 tablet (75 mg total) by mouth daily., Disp: 90 tablet, Rfl: 3   ibuprofen (ADVIL) 200 MG tablet, Take 200 mg by mouth 2 (two) times daily. As needed, Disp: , Rfl:    lidocaine   (LIDODERM ) 5 %, Place 1 patch onto the skin daily. Remove & Discard patch within 12 hours or as directed by MD, Disp: 30 patch, Rfl: 4   nitroGLYCERIN  (NITROSTAT ) 0.4 MG SL tablet, Place 1 tablet (0.4 mg total) under the tongue every 5 (five) minutes as needed for chest pain., Disp: 25 tablet, Rfl: 4   pantoprazole  (PROTONIX ) 40 MG tablet, Take 40 mg by mouth daily., Disp: , Rfl:    polyethylene glycol (MIRALAX  / GLYCOLAX ) 17 g packet, Take 17 g by mouth daily as needed (Constipation)., Disp: 14 each, Rfl: 0   pregabalin  (LYRICA ) 100 MG capsule, Take 1 capsule (100 mg total) by mouth 2 (two) times daily., Disp: 60 capsule, Rfl: 0   pregabalin  (LYRICA ) 100 MG capsule, Take 1 capsule (100 mg total) by mouth 2 (two) times daily., Disp: 60 capsule, Rfl: 0   sildenafil  (VIAGRA ) 100 MG tablet, Take 1 tablet (100 mg total) by mouth daily as needed for erectile dysfunction., Disp: 10 tablet, Rfl: 6   simethicone  (MYLICON) 80 MG chewable tablet, Chew 80 mg by mouth as needed for flatulence., Disp: , Rfl:   Allergies:  Allergies  Allergen Reactions   Brilinta  [Ticagrelor ] Shortness Of Breath     Patient was taking 60 mg  Twice a day -  changed to Clopidogrel  75 mg  03/02/19   Ciprofloxacin Anaphylaxis   Statins Other (See Comments)    Joint pain.Myalgias -- HAS TRIED LIPITOR, ZOCOR & CRESTOR     Past Medical History, Surgical history, Social history, and Family History were reviewed and updated.  Review of Systems: Review of Systems  Constitutional:  Positive for unexpected weight change.  HENT:  Negative.    Eyes: Negative.   Respiratory: Negative.    Cardiovascular:  Positive for chest pain.  Gastrointestinal: Negative.   Endocrine: Negative.   Genitourinary: Negative.    Musculoskeletal: Negative.   Skin: Negative.   Neurological: Negative.   Hematological: Negative.   Psychiatric/Behavioral: Negative.      Physical Exam: Vital signs show temperature 97.5.  Pulse 70.  Blood pressure  104/53.  Weight is 156 pounds.  Wt Readings from Last 3 Encounters:  09/08/23 156 lb (70.8 kg)  07/31/23 157 lb (71.2 kg)  06/19/23 158 lb (71.7 kg)    Physical Exam Vitals reviewed.  HENT:     Head: Normocephalic and atraumatic.  Eyes:     Pupils: Pupils are equal, round, and reactive to light.  Cardiovascular:     Rate and Rhythm: Normal rate and regular rhythm.     Heart sounds: Normal heart sounds.  Pulmonary:     Effort: Pulmonary effort is normal.     Breath sounds: Normal breath sounds.  Abdominal:     General: Bowel sounds are normal.     Palpations: Abdomen is soft.  Musculoskeletal:        General: No tenderness or deformity. Normal range of motion.     Cervical back: Normal range of motion.     Comments: He does have limited range of motion of the right shoulder.  There is some weakness in the right arm.    Lymphadenopathy:     Cervical: No cervical adenopathy.  Skin:    General: Skin is warm and dry.     Findings: No erythema or rash.  Neurological:     Mental Status: He is alert and oriented to person, place, and time.  Psychiatric:        Behavior: Behavior normal.        Thought Content: Thought content normal.        Judgment: Judgment normal.     Lab Results  Component Value Date   WBC 6.4 09/08/2023   HGB 13.2 09/08/2023   HCT 39.1 09/08/2023   MCV 90.9 09/08/2023   PLT 254 09/08/2023     Chemistry      Component Value Date/Time   NA 139 09/08/2023 0823   NA 138 04/11/2021 1130   K 4.5 09/08/2023 0823   CL 103 09/08/2023 0823   CO2 26 09/08/2023 0823   BUN 25 (H) 09/08/2023 0823   BUN 20 04/11/2021 1130   CREATININE 1.40 (H) 09/08/2023 0823      Component Value Date/Time   CALCIUM  10.1 09/08/2023 0823   ALKPHOS 111 09/08/2023 0823   AST 8 (L) 09/08/2023 0823   ALT 6 09/08/2023 0823   BILITOT 0.5 09/08/2023 0823       Impression and Plan: Patrick Adams is a very nice 78 year old white male.  He has a history of adenocarcinoma of the  lung.  He seemed to have an area of disease on MRI that was in the right eighth and 10th rib.  I think he had radiation for this as completed in February.  Again, his cancer is under  good control.  He is in remission.  I am happy about this.  We probably have to repeat another PET scan on him sometime this Summer.  Again we will make the referral to Orthopedic Surgery.  I know that Dr. Dalldorf will be able to help him out.  Hopefully he will be able to stop smoking.  I think we can bike him through most of the Summer.  We will see about doing the PET scan sometime in September.  I am just happy that so far, cancer is not a problem.   Patrick Mars, MD 6/10/20259:06 AM

## 2023-09-16 ENCOUNTER — Other Ambulatory Visit: Payer: Self-pay

## 2023-09-16 ENCOUNTER — Other Ambulatory Visit (HOSPITAL_BASED_OUTPATIENT_CLINIC_OR_DEPARTMENT_OTHER): Payer: Self-pay

## 2023-09-16 MED ORDER — PREGABALIN 100 MG PO CAPS: 100.0000 mg | ORAL_CAPSULE | Freq: Two times a day (BID) | ORAL | 0 refills | Status: DC

## 2023-09-16 MED ORDER — PREGABALIN 100 MG PO CAPS
100.0000 mg | ORAL_CAPSULE | Freq: Two times a day (BID) | ORAL | 2 refills | Status: DC
  Filled 2023-09-16: qty 60, 30d supply, fill #0
  Filled 2023-10-19: qty 60, 30d supply, fill #1

## 2023-10-19 ENCOUNTER — Other Ambulatory Visit (HOSPITAL_BASED_OUTPATIENT_CLINIC_OR_DEPARTMENT_OTHER): Payer: Self-pay

## 2023-10-19 ENCOUNTER — Other Ambulatory Visit: Payer: Self-pay | Admitting: *Deleted

## 2023-10-19 MED ORDER — PREGABALIN 100 MG PO CAPS
100.0000 mg | ORAL_CAPSULE | Freq: Two times a day (BID) | ORAL | 2 refills | Status: DC
  Filled 2023-10-19 – 2023-11-17 (×2): qty 60, 30d supply, fill #0
  Filled 2023-12-18: qty 60, 30d supply, fill #1
  Filled 2024-01-16 (×2): qty 60, 30d supply, fill #0

## 2023-11-09 ENCOUNTER — Encounter (HOSPITAL_COMMUNITY)
Admission: RE | Admit: 2023-11-09 | Discharge: 2023-11-09 | Disposition: A | Discharge status: Home / Self Care | Source: Ambulatory Visit | Attending: Hematology & Oncology | Admitting: Hematology & Oncology

## 2023-11-09 DIAGNOSIS — C7951 Secondary malignant neoplasm of bone: Secondary | ICD-10-CM | POA: Diagnosis present

## 2023-11-09 DIAGNOSIS — C3490 Malignant neoplasm of unspecified part of unspecified bronchus or lung: Secondary | ICD-10-CM | POA: Diagnosis present

## 2023-11-09 LAB — GLUCOSE, CAPILLARY: Glucose-Capillary: 93 mg/dL (ref 70–99)

## 2023-11-09 MED ORDER — FLUDEOXYGLUCOSE F - 18 (FDG) INJECTION
7.7800 | Freq: Once | INTRAVENOUS | Status: AC | PRN
  Administered 2023-11-09 (×2): 7.78 via INTRAVENOUS

## 2023-11-17 ENCOUNTER — Other Ambulatory Visit (HOSPITAL_BASED_OUTPATIENT_CLINIC_OR_DEPARTMENT_OTHER): Payer: Self-pay

## 2023-11-17 ENCOUNTER — Ambulatory Visit: Payer: Self-pay | Admitting: Hematology & Oncology

## 2023-11-18 ENCOUNTER — Encounter: Payer: Self-pay | Admitting: Hematology & Oncology

## 2023-11-18 NOTE — Telephone Encounter (Signed)
 error

## 2023-11-18 NOTE — Telephone Encounter (Signed)
-----   Message from Maude JONELLE Crease sent at 11/17/2023  7:31 PM EDT ----- Call - the PET scan looks great!!   No obvious active cancer!!  Jeralyn ----- Message ----- From: Interface, Rad Results In Sent: 11/17/2023   1:52 PM EDT To: Maude JONELLE Crease, MD

## 2023-11-23 ENCOUNTER — Other Ambulatory Visit: Payer: Self-pay

## 2023-11-23 ENCOUNTER — Inpatient Hospital Stay: Admitting: Hematology & Oncology

## 2023-11-23 ENCOUNTER — Inpatient Hospital Stay: Attending: Hematology & Oncology

## 2023-11-23 ENCOUNTER — Encounter: Payer: Self-pay | Admitting: Hematology & Oncology

## 2023-11-23 ENCOUNTER — Inpatient Hospital Stay

## 2023-11-23 ENCOUNTER — Other Ambulatory Visit (HOSPITAL_BASED_OUTPATIENT_CLINIC_OR_DEPARTMENT_OTHER): Payer: Self-pay

## 2023-11-23 VITALS — BP 130/62 | HR 78 | Temp 98.1°F | Resp 18 | Ht 72.0 in | Wt 161.0 lb

## 2023-11-23 DIAGNOSIS — C7951 Secondary malignant neoplasm of bone: Secondary | ICD-10-CM

## 2023-11-23 DIAGNOSIS — N5201 Erectile dysfunction due to arterial insufficiency: Secondary | ICD-10-CM

## 2023-11-23 DIAGNOSIS — C3411 Malignant neoplasm of upper lobe, right bronchus or lung: Secondary | ICD-10-CM | POA: Diagnosis not present

## 2023-11-23 DIAGNOSIS — C3490 Malignant neoplasm of unspecified part of unspecified bronchus or lung: Secondary | ICD-10-CM | POA: Diagnosis not present

## 2023-11-23 DIAGNOSIS — M791 Myalgia, unspecified site: Secondary | ICD-10-CM | POA: Diagnosis not present

## 2023-11-23 LAB — CMP (CANCER CENTER ONLY)
ALT: 7 U/L (ref 0–44)
AST: 14 U/L — ABNORMAL LOW (ref 15–41)
Albumin: 4.2 g/dL (ref 3.5–5.0)
Alkaline Phosphatase: 125 U/L (ref 38–126)
Anion gap: 12 (ref 5–15)
BUN: 24 mg/dL — ABNORMAL HIGH (ref 8–23)
CO2: 24 mmol/L (ref 22–32)
Calcium: 10 mg/dL (ref 8.9–10.3)
Chloride: 103 mmol/L (ref 98–111)
Creatinine: 1.21 mg/dL (ref 0.61–1.24)
GFR, Estimated: >60 mL/min (ref >60)
Glucose, Bld: 129 mg/dL — ABNORMAL HIGH (ref 70–99)
Potassium: 4.5 mmol/L (ref 3.5–5.1)
Sodium: 139 mmol/L (ref 135–145)
Total Bilirubin: 0.4 mg/dL (ref 0.0–1.2)
Total Protein: 6.9 g/dL (ref 6.5–8.1)

## 2023-11-23 LAB — CBC WITH DIFFERENTIAL (CANCER CENTER ONLY)
Abs Immature Granulocytes: 0.02 K/uL (ref 0.00–0.07)
Basophils Absolute: 0 K/uL (ref 0.0–0.1)
Basophils Relative: 0 %
Eosinophils Absolute: 0.1 K/uL (ref 0.0–0.5)
Eosinophils Relative: 2 %
HCT: 40.9 % (ref 39.0–52.0)
Hemoglobin: 13.8 g/dL (ref 13.0–17.0)
Immature Granulocytes: 0 %
Lymphocytes Relative: 14 %
Lymphs Abs: 0.9 K/uL (ref 0.7–4.0)
MCH: 30.3 pg (ref 26.0–34.0)
MCHC: 33.7 g/dL (ref 30.0–36.0)
MCV: 89.9 fL (ref 80.0–100.0)
Monocytes Absolute: 0.5 K/uL (ref 0.1–1.0)
Monocytes Relative: 7 %
Neutro Abs: 5.1 K/uL (ref 1.7–7.7)
Neutrophils Relative %: 77 %
Platelet Count: 261 K/uL (ref 150–400)
RBC: 4.55 MIL/uL (ref 4.22–5.81)
RDW: 13.6 % (ref 11.5–15.5)
WBC Count: 6.6 K/uL (ref 4.0–10.5)
nRBC: 0 % (ref 0.0–0.2)

## 2023-11-23 MED ORDER — SILDENAFIL CITRATE 100 MG PO TABS: 100.0000 mg | ORAL_TABLET | Freq: Every day | ORAL | 3 refills | Status: DC | PRN

## 2023-11-23 MED ORDER — DENOSUMAB 120 MG/1.7ML ~~LOC~~ SOLN
120.0000 mg | Freq: Once | SUBCUTANEOUS | Status: AC
Start: 2023-11-23 — End: 2023-11-23
  Administered 2023-11-23: 120 mg via SUBCUTANEOUS
  Filled 2023-11-23: qty 1.7

## 2023-11-23 NOTE — Progress Notes (Signed)
 Hematology and Oncology Follow Up Visit  Patrick Adams [id number] [birth year] 78 y.o. 11/23/2023   Principle Diagnosis:  Metastatic non-small cell lung cancer-adenocarcinoma. --- NO actionable mutations  Current Therapy:   Xgeva  120 mg subcu every 3 months-next dose in 01/2024  XRT to the right upper lobe nodule -definitive treatment on 03/09/2023     Interim History:  Patrick Adams is back for a follow-up.  The problem that he has right now is that he has limited mobility of his right shoulder.  This happened after he he had some stents placed by vascular surgery into the iliac arteries at [facility].  After that, he had limited range of motion of the right shoulder.  He has weakness in the right arm.  I suspect this is by some kind of neurological compromise.  He was told that this will get better.  So far, it is not gotten better.  He really needs to see Orthopedic Surgery.  I will see about making referral to Patrick Adams.  He also has pain in the left inner thigh.  Sounds like he may have a groin pull.  He says that the only thing that works is ibuprofen.  I told him that if he can take ibuprofen that he must take it with food.  He is still smoking.  Again, I am not sure how much he smokes but I suspect it probably is quite a bit.  He unfortunately had problems with erectile dysfunction.  He had an opportunity to enjoy some intimacy but unfortunately could not perform..  We will send in some Viagra  for him.  He did have a PET scan that was done.  PET scan was done on 11/09/2023.  The PET scan did not show any evidence of recurrent disease.  Everything looked very stable.  There was some mild hypermetabolism and subcentimeter mediastinal nodes.  There is the prostate which has some slight uptake.  He is eating okay.  He has had no problems with nausea or vomiting.  Overall, I will have to say that his performance status probably ECOG 1.   Medications:  Current Outpatient  Medications:    methocarbamol (ROBAXIN) 500 MG tablet, Take 500 mg by mouth 3 (three) times daily as needed., Disp: , Rfl:    acetaminophen  (TYLENOL ) 500 MG tablet, Take 1,000 mg by mouth every 6 (six) hours as needed for moderate pain or headache., Disp: , Rfl:    cholecalciferol (VITAMIN D3) 25 MCG (1000 UT) tablet, Take 1,000 Units by mouth daily., Disp: , Rfl:    clopidogrel  (PLAVIX ) 75 MG tablet, Take 1 tablet (75 mg total) by mouth daily., Disp: 90 tablet, Rfl: 3   ibuprofen (ADVIL) 200 MG tablet, Take 200 mg by mouth 2 (two) times daily. As needed, Disp: , Rfl:    lidocaine  (LIDODERM ) 5 %, Place 1 patch onto the skin daily. Remove & Discard patch within 12 hours or as directed by MD, Disp: 30 patch, Rfl: 4   losartan  (COZAAR ) 100 MG tablet, Take 50 mg by mouth., Disp: , Rfl:    nitroGLYCERIN  (NITROSTAT ) 0.4 MG SL tablet, Place 1 tablet (0.4 mg total) under the tongue every 5 (five) minutes as needed for chest pain., Disp: 25 tablet, Rfl: 4   pantoprazole  (PROTONIX ) 40 MG tablet, Take 40 mg by mouth daily., Disp: , Rfl:    polyethylene glycol (MIRALAX  / GLYCOLAX ) 17 g packet, Take 17 g by mouth daily as needed (Constipation)., Disp: 14 each, Rfl: 0  pregabalin  (LYRICA ) 100 MG capsule, Take 1 capsule (100 mg total) by mouth 2 (two) times daily., Disp: 60 capsule, Rfl: 2   sildenafil  (VIAGRA ) 100 MG tablet, Take 1 tablet (100 mg total) by mouth daily as needed for erectile dysfunction., Disp: 10 tablet, Rfl: 6   simethicone  (MYLICON) 80 MG chewable tablet, Chew 80 mg by mouth as needed for flatulence., Disp: , Rfl:   Allergies:  Allergies  Allergen Reactions   Brilinta  [Ticagrelor ] Shortness Of Breath     Patient was taking 60 mg  Twice a day - changed to Clopidogrel  75 mg  03/02/19   Ciprofloxacin Anaphylaxis   Statins Other (See Comments)    Joint pain.Myalgias -- HAS TRIED LIPITOR, ZOCOR & CRESTOR     Past Medical History, Surgical history, Social history, and Family History were  reviewed and updated.  Review of Systems: Review of Systems  Constitutional:  Positive for unexpected weight change.  HENT:  Negative.    Eyes: Negative.   Respiratory: Negative.    Cardiovascular:  Positive for chest pain.  Gastrointestinal: Negative.   Endocrine: Negative.   Genitourinary: Negative.    Musculoskeletal: Negative.   Skin: Negative.   Neurological: Negative.   Hematological: Negative.   Psychiatric/Behavioral: Negative.      Physical Exam: Vital signs show temperature 98.1.  Pulse 78.  Blood pressure 130/62.  Weight is 161 pounds.    Wt Readings from Last 3 Encounters:  11/23/23 161 lb (73 kg)  09/08/23 156 lb (70.8 kg)  07/31/23 157 lb (71.2 kg)    Physical Exam Vitals reviewed.  HENT:     Head: Normocephalic and atraumatic.  Eyes:     Pupils: Pupils are equal, round, and reactive to light.  Cardiovascular:     Rate and Rhythm: Normal rate and regular rhythm.     Heart sounds: Normal heart sounds.  Pulmonary:     Effort: Pulmonary effort is normal.     Breath sounds: Normal breath sounds.  Abdominal:     General: Bowel sounds are normal.     Palpations: Abdomen is soft.  Musculoskeletal:        General: No tenderness or deformity. Normal range of motion.     Cervical back: Normal range of motion.     Comments: He does have limited range of motion of the right shoulder.  There is some weakness in the right arm.    Lymphadenopathy:     Cervical: No cervical adenopathy.  Skin:    General: Skin is warm and dry.     Findings: No erythema or rash.  Neurological:     Mental Status: He is alert and oriented to person, place, and time.  Psychiatric:        Behavior: Behavior normal.        Thought Content: Thought content normal.        Judgment: Judgment normal.      Lab Results  Component Value Date   WBC 6.6 11/23/2023   HGB 13.8 11/23/2023   HCT 40.9 11/23/2023   MCV 89.9 11/23/2023   PLT 261 11/23/2023     Chemistry      Component  Value Date/Time   NA 139 09/08/2023 0823   NA 138 04/11/2021 1130   K 4.5 09/08/2023 0823   CL 103 09/08/2023 0823   CO2 26 09/08/2023 0823   BUN 25 (H) 09/08/2023 0823   BUN 20 04/11/2021 1130   CREATININE 1.40 (H) 09/08/2023 9176  Component Value Date/Time   CALCIUM  10.1 09/08/2023 0823   ALKPHOS 111 09/08/2023 0823   AST 8 (L) 09/08/2023 0823   ALT 6 09/08/2023 0823   BILITOT 0.5 09/08/2023 0823       Impression and Plan: Mr. Talarico is a very nice 78 year old white male.  He has a history of adenocarcinoma of the lung.  He seemed to have an area of disease on MRI that was in the right eighth and 10th rib.  I think he had radiation for this as completed in February.  Again, his cancer is under good control.  He is in remission.  I am happy about this.  We probably do not have to do another PET scan probably until next year.  I just feel better is having these issues with his myalgias.  Hopefully, Orthopedic Surgery can help.  These are nothing to do with his underlying malignancy.  He will get his Xgeva  today.  I will have him come back in about 3 months.   Maude JONELLE Crease, MD 8/25/20259:16 AM

## 2023-11-23 NOTE — Patient Instructions (Signed)
 Denosumab  Injection (Oncology) What is this medication? DENOSUMAB  (den oh SUE mab) prevents weakened bones caused by cancer. It may also be used to treat noncancerous bone tumors that cannot be removed by surgery. It can also be used to treat high calcium  levels in the blood caused by cancer. It works by blocking a protein that causes bones to break down quickly. This slows down the release of calcium  from bones, which lowers calcium  levels in your blood. It also makes your bones stronger and less likely to break (fracture). This medicine may be used for other purposes; ask your health care provider or pharmacist if you have questions. COMMON BRAND NAME(S): XGEVA  What should I tell my care team before I take this medication? They need to know if you have any of these conditions: Dental disease Having surgery or tooth extraction Infection Kidney disease Low levels of calcium  or vitamin D  in the blood Malnutrition On hemodialysis Skin conditions or sensitivity Thyroid  or parathyroid disease An unusual reaction to denosumab , other medications, foods, dyes, or preservatives Pregnant or trying to get pregnant Breast-feeding How should I use this medication? This medication is for injection under the skin. It is given by your care team in a hospital or clinic setting. A special MedGuide will be given to you before each treatment. Be sure to read this information carefully each time. Talk to your care team about the use of this medication in children. While it may be prescribed for children as young as 13 years for selected conditions, precautions do apply. Overdosage: If you think you have taken too much of this medicine contact a poison control center or emergency room at once. NOTE: This medicine is only for you. Do not share this medicine with others. What if I miss a dose? Keep appointments for follow-up doses. It is important not to miss your dose. Call your care team if you are unable to  keep an appointment. What may interact with this medication? Do not take this medication with any of the following: Other medications containing denosumab  This medication may also interact with the following: Medications that lower your chance of fighting infection Steroid medications, such as prednisone  or cortisone This list may not describe all possible interactions. Give your health care provider a list of all the medicines, herbs, non-prescription drugs, or dietary supplements you use. Also tell them if you smoke, drink alcohol, or use illegal drugs. Some items may interact with your medicine. What should I watch for while using this medication? Your condition will be monitored carefully while you are receiving this medication. You may need blood work while taking this medication. This medication may increase your risk of getting an infection. Call your care team for advice if you get a fever, chills, sore throat, or other symptoms of a cold or flu. Do not treat yourself. Try to avoid being around people who are sick. You should make sure you get enough calcium  and vitamin D  while you are taking this medication, unless your care team tells you not to. Discuss the foods you eat and the vitamins you take with your care team. Some people who take this medication have severe bone, joint, or muscle pain. This medication may also increase your risk for jaw problems or a broken thigh bone. Tell your care team right away if you have severe pain in your jaw, bones, joints, or muscles. Tell your care team if you have any pain that does not go away or that gets worse. Talk  to your care team if you may be pregnant. Serious birth defects can occur if you take this medication during pregnancy and for 5 months after the last dose. You will need a negative pregnancy test before starting this medication. Contraception is recommended while taking this medication and for 5 months after the last dose. Your care team  can help you find the option that works for you. What side effects may I notice from receiving this medication? Side effects that you should report to your care team as soon as possible: Allergic reactions--skin rash, itching, hives, swelling of the face, lips, tongue, or throat Bone, joint, or muscle pain Low calcium  level--muscle pain or cramps, confusion, tingling, or numbness in the hands or feet Osteonecrosis of the jaw--pain, swelling, or redness in the mouth, numbness of the jaw, poor healing after dental work, unusual discharge from the mouth, visible bones in the mouth Side effects that usually do not require medical attention (report to your care team if they continue or are bothersome): Cough Diarrhea Fatigue Headache Nausea This list may not describe all possible side effects. Call your doctor for medical advice about side effects. You may report side effects to FDA at 1-800-FDA-1088. Where should I keep my medication? This medication is given in a hospital or clinic. It will not be stored at home. NOTE: This sheet is a summary. It may not cover all possible information. If you have questions about this medicine, talk to your doctor, pharmacist, or health care provider.  2024 Elsevier/Gold Standard (2021-08-07 00:00:00)

## 2023-12-18 ENCOUNTER — Other Ambulatory Visit (HOSPITAL_BASED_OUTPATIENT_CLINIC_OR_DEPARTMENT_OTHER): Payer: Self-pay

## 2024-01-16 ENCOUNTER — Other Ambulatory Visit (HOSPITAL_BASED_OUTPATIENT_CLINIC_OR_DEPARTMENT_OTHER): Payer: Self-pay

## 2024-01-18 ENCOUNTER — Emergency Department (HOSPITAL_COMMUNITY)

## 2024-01-18 ENCOUNTER — Other Ambulatory Visit: Payer: Self-pay

## 2024-01-18 ENCOUNTER — Observation Stay (HOSPITAL_COMMUNITY)
Admission: EM | Admit: 2024-01-18 | Discharge: 2024-01-19 | Disposition: A | Discharge status: Home / Self Care | Attending: Student | Admitting: Student

## 2024-01-18 ENCOUNTER — Encounter (HOSPITAL_COMMUNITY): Payer: Self-pay

## 2024-01-18 DIAGNOSIS — I711 Thoracic aortic aneurysm, ruptured, unspecified: Principal | ICD-10-CM | POA: Insufficient documentation

## 2024-01-18 DIAGNOSIS — Z8679 Personal history of other diseases of the circulatory system: Secondary | ICD-10-CM

## 2024-01-18 DIAGNOSIS — F17210 Nicotine dependence, cigarettes, uncomplicated: Secondary | ICD-10-CM | POA: Insufficient documentation

## 2024-01-18 DIAGNOSIS — Z720 Tobacco use: Secondary | ICD-10-CM | POA: Diagnosis present

## 2024-01-18 DIAGNOSIS — Z7902 Long term (current) use of antithrombotics/antiplatelets: Secondary | ICD-10-CM | POA: Diagnosis not present

## 2024-01-18 DIAGNOSIS — I428 Other cardiomyopathies: Secondary | ICD-10-CM | POA: Insufficient documentation

## 2024-01-18 DIAGNOSIS — Z79899 Other long term (current) drug therapy: Secondary | ICD-10-CM | POA: Diagnosis not present

## 2024-01-18 DIAGNOSIS — F17220 Nicotine dependence, chewing tobacco, uncomplicated: Secondary | ICD-10-CM | POA: Insufficient documentation

## 2024-01-18 DIAGNOSIS — I712 Thoracic aortic aneurysm, without rupture, unspecified: Principal | ICD-10-CM | POA: Insufficient documentation

## 2024-01-18 DIAGNOSIS — C7951 Secondary malignant neoplasm of bone: Secondary | ICD-10-CM | POA: Insufficient documentation

## 2024-01-18 DIAGNOSIS — R0789 Other chest pain: Secondary | ICD-10-CM | POA: Diagnosis present

## 2024-01-18 DIAGNOSIS — Z959 Presence of cardiac and vascular implant and graft, unspecified: Secondary | ICD-10-CM | POA: Insufficient documentation

## 2024-01-18 DIAGNOSIS — Y712 Prosthetic and other implants, materials and accessory cardiovascular devices associated with adverse incidents: Secondary | ICD-10-CM | POA: Insufficient documentation

## 2024-01-18 DIAGNOSIS — C3490 Malignant neoplasm of unspecified part of unspecified bronchus or lung: Secondary | ICD-10-CM | POA: Insufficient documentation

## 2024-01-18 DIAGNOSIS — C7800 Secondary malignant neoplasm of unspecified lung: Secondary | ICD-10-CM | POA: Insufficient documentation

## 2024-01-18 DIAGNOSIS — Z9861 Coronary angioplasty status: Secondary | ICD-10-CM | POA: Diagnosis not present

## 2024-01-18 DIAGNOSIS — I2510 Atherosclerotic heart disease of native coronary artery without angina pectoris: Secondary | ICD-10-CM | POA: Diagnosis not present

## 2024-01-18 DIAGNOSIS — J90 Pleural effusion, not elsewhere classified: Secondary | ICD-10-CM | POA: Diagnosis not present

## 2024-01-18 DIAGNOSIS — T82310A Breakdown (mechanical) of aortic (bifurcation) graft (replacement), initial encounter: Secondary | ICD-10-CM | POA: Insufficient documentation

## 2024-01-18 DIAGNOSIS — M545 Low back pain, unspecified: Secondary | ICD-10-CM | POA: Diagnosis present

## 2024-01-18 DIAGNOSIS — I10 Essential (primary) hypertension: Secondary | ICD-10-CM | POA: Diagnosis present

## 2024-01-18 DIAGNOSIS — I255 Ischemic cardiomyopathy: Secondary | ICD-10-CM | POA: Insufficient documentation

## 2024-01-18 LAB — CBC WITH DIFFERENTIAL/PLATELET
Abs Immature Granulocytes: 0.03 K/uL (ref 0.00–0.07)
Basophils Absolute: 0 K/uL (ref 0.0–0.1)
Basophils Relative: 0 %
Eosinophils Absolute: 0 K/uL (ref 0.0–0.5)
Eosinophils Relative: 1 %
HCT: 42.6 % (ref 39.0–52.0)
Hemoglobin: 14.1 g/dL (ref 13.0–17.0)
Immature Granulocytes: 0 %
Lymphocytes Relative: 4 %
Lymphs Abs: 0.3 K/uL — ABNORMAL LOW (ref 0.7–4.0)
MCH: 30.4 pg (ref 26.0–34.0)
MCHC: 33.1 g/dL (ref 30.0–36.0)
MCV: 91.8 fL (ref 80.0–100.0)
Monocytes Absolute: 0.1 K/uL (ref 0.1–1.0)
Monocytes Relative: 1 %
Neutro Abs: 8.3 K/uL — ABNORMAL HIGH (ref 1.7–7.7)
Neutrophils Relative %: 94 %
Platelets: 328 K/uL (ref 150–400)
RBC: 4.64 MIL/uL (ref 4.22–5.81)
RDW: 13 % (ref 11.5–15.5)
WBC: 8.8 K/uL (ref 4.0–10.5)
nRBC: 0 % (ref 0.0–0.2)

## 2024-01-18 LAB — COMPREHENSIVE METABOLIC PANEL WITH GFR
ALT: 12 U/L (ref 0–44)
AST: 13 U/L — ABNORMAL LOW (ref 15–41)
Albumin: 3.5 g/dL (ref 3.5–5.0)
Alkaline Phosphatase: 111 U/L (ref 38–126)
Anion gap: 14 (ref 5–15)
BUN: 20 mg/dL (ref 8–23)
CO2: 17 mmol/L — ABNORMAL LOW (ref 22–32)
Calcium: 9.3 mg/dL (ref 8.9–10.3)
Chloride: 104 mmol/L (ref 98–111)
Creatinine, Ser: 1.1 mg/dL (ref 0.61–1.24)
GFR, Estimated: >60 mL/min (ref >60)
Glucose, Bld: 140 mg/dL — ABNORMAL HIGH (ref 70–99)
Potassium: 4.9 mmol/L (ref 3.5–5.1)
Sodium: 135 mmol/L (ref 135–145)
Total Bilirubin: 0.7 mg/dL (ref 0.0–1.2)
Total Protein: 7 g/dL (ref 6.5–8.1)

## 2024-01-18 LAB — PROTIME-INR
INR: 1 (ref 0.8–1.2)
Prothrombin Time: 13.7 s (ref 11.4–15.2)

## 2024-01-18 LAB — APTT: aPTT: 38 s — ABNORMAL HIGH (ref 24–36)

## 2024-01-18 MED ORDER — IOHEXOL 350 MG/ML SOLN
100.0000 mL | Freq: Once | INTRAVENOUS | Status: AC | PRN
  Administered 2024-01-18: 100 mL via INTRAVENOUS

## 2024-01-18 MED ORDER — OXYCODONE-ACETAMINOPHEN 5-325 MG PO TABS
1.0000 | ORAL_TABLET | Freq: Once | ORAL | Status: AC
  Administered 2024-01-18: 1 via ORAL
  Filled 2024-01-18: qty 1

## 2024-01-18 MED ORDER — FENTANYL CITRATE (PF) 50 MCG/ML IJ SOSY
50.0000 ug | PREFILLED_SYRINGE | Freq: Once | INTRAMUSCULAR | Status: AC
  Administered 2024-01-19: 50 ug via INTRAVENOUS
  Filled 2024-01-18: qty 1

## 2024-01-18 MED ORDER — ONDANSETRON HCL 4 MG/2ML IJ SOLN
4.0000 mg | Freq: Once | INTRAMUSCULAR | Status: AC
Start: 2024-01-19 — End: 2024-01-19
  Administered 2024-01-19: 4 mg via INTRAVENOUS
  Filled 2024-01-18: qty 2

## 2024-01-18 NOTE — ED Triage Notes (Signed)
 PER EMS: pt is from home with c/o right sided back pain x 4 months. He has been seen by pulmonologist and orthopedic doctors with no diagnosis; was told to go to the ER for a possible spinal stent leakage.   BP-172/86, HR-88, 99% RA, CBG-199

## 2024-01-18 NOTE — ED Provider Triage Note (Signed)
 Emergency Medicine Provider Triage Evaluation Note  Patrick Adams , a 78 y.o. male  was evaluated in triage.  Pt complains of right-sided thoracic back pain.  Has been seen by orthopedic surgery as well as pulmonary.  No clear diagnosis.  Was told by orthopedic surgery to come here for evaluation abroad, graft leak given previous vascular pathology.  Review of Systems  Positive: Back pain  Negative: CP/SOB  Physical Exam  BP (!) 177/88 (BP Location: Left Arm)   Pulse 78   Temp 97.7 F (36.5 C) (Oral)   Resp 18   Ht 6' (1.829 m)   Wt 72.6 kg   SpO2 98%   BMI 21.70 kg/m  Gen:   Awake, no distress   Resp:  Normal effort  MSK:   Moves extremities without difficulty  Other:    Medical Decision Making  Medically screening exam initiated at 8:08 PM.  Appropriate orders placed.  Patrick Adams was informed that the remainder of the evaluation will be completed by another provider, this initial triage assessment does not replace that evaluation, and the importance of remaining in the ED until their evaluation is complete.  lab workup ordered.  CTA of the chest abdomen pelvis ordered as well   Patrick Lavonia SAILOR, MD 01/18/24 2009

## 2024-01-19 ENCOUNTER — Inpatient Hospital Stay (HOSPITAL_COMMUNITY): Admitting: Certified Registered Nurse Anesthetist

## 2024-01-19 ENCOUNTER — Encounter (HOSPITAL_COMMUNITY)
Admission: EM | Disposition: A | Discharge status: Home / Self Care | Payer: Self-pay | Source: Home / Self Care | Attending: Emergency Medicine

## 2024-01-19 ENCOUNTER — Inpatient Hospital Stay (HOSPITAL_COMMUNITY)

## 2024-01-19 ENCOUNTER — Encounter (HOSPITAL_COMMUNITY): Payer: Self-pay | Admitting: Internal Medicine

## 2024-01-19 DIAGNOSIS — I255 Ischemic cardiomyopathy: Secondary | ICD-10-CM

## 2024-01-19 DIAGNOSIS — I10 Essential (primary) hypertension: Secondary | ICD-10-CM

## 2024-01-19 DIAGNOSIS — I7123 Aneurysm of the descending thoracic aorta, without rupture: Secondary | ICD-10-CM

## 2024-01-19 DIAGNOSIS — J90 Pleural effusion, not elsewhere classified: Secondary | ICD-10-CM

## 2024-01-19 DIAGNOSIS — C3490 Malignant neoplasm of unspecified part of unspecified bronchus or lung: Secondary | ICD-10-CM

## 2024-01-19 DIAGNOSIS — Z8679 Personal history of other diseases of the circulatory system: Secondary | ICD-10-CM

## 2024-01-19 DIAGNOSIS — I712 Thoracic aortic aneurysm, without rupture, unspecified: Secondary | ICD-10-CM | POA: Diagnosis present

## 2024-01-19 DIAGNOSIS — I7121 Aneurysm of the ascending aorta, without rupture: Secondary | ICD-10-CM | POA: Diagnosis not present

## 2024-01-19 DIAGNOSIS — I2510 Atherosclerotic heart disease of native coronary artery without angina pectoris: Secondary | ICD-10-CM

## 2024-01-19 DIAGNOSIS — C7951 Secondary malignant neoplasm of bone: Secondary | ICD-10-CM

## 2024-01-19 DIAGNOSIS — I711 Thoracic aortic aneurysm, ruptured, unspecified: Secondary | ICD-10-CM | POA: Diagnosis not present

## 2024-01-19 DIAGNOSIS — Z720 Tobacco use: Secondary | ICD-10-CM

## 2024-01-19 DIAGNOSIS — Z9861 Coronary angioplasty status: Secondary | ICD-10-CM

## 2024-01-19 LAB — CBC WITH DIFFERENTIAL/PLATELET
Abs Immature Granulocytes: 0.04 K/uL (ref 0.00–0.07)
Basophils Absolute: 0 K/uL (ref 0.0–0.1)
Basophils Relative: 0 %
Eosinophils Absolute: 0 K/uL (ref 0.0–0.5)
Eosinophils Relative: 0 %
HCT: 37.5 % — ABNORMAL LOW (ref 39.0–52.0)
Hemoglobin: 12.8 g/dL — ABNORMAL LOW (ref 13.0–17.0)
Immature Granulocytes: 0 %
Lymphocytes Relative: 5 %
Lymphs Abs: 0.5 K/uL — ABNORMAL LOW (ref 0.7–4.0)
MCH: 30.3 pg (ref 26.0–34.0)
MCHC: 34.1 g/dL (ref 30.0–36.0)
MCV: 88.9 fL (ref 80.0–100.0)
Monocytes Absolute: 0.5 K/uL (ref 0.1–1.0)
Monocytes Relative: 5 %
Neutro Abs: 9 K/uL — ABNORMAL HIGH (ref 1.7–7.7)
Neutrophils Relative %: 90 %
Platelets: 333 K/uL (ref 150–400)
RBC: 4.22 MIL/uL (ref 4.22–5.81)
RDW: 13.1 % (ref 11.5–15.5)
WBC: 10 K/uL (ref 4.0–10.5)
nRBC: 0 % (ref 0.0–0.2)

## 2024-01-19 LAB — BASIC METABOLIC PANEL WITH GFR
Anion gap: 9 (ref 5–15)
BUN: 20 mg/dL (ref 8–23)
CO2: 20 mmol/L — ABNORMAL LOW (ref 22–32)
Calcium: 8.9 mg/dL (ref 8.9–10.3)
Chloride: 105 mmol/L (ref 98–111)
Creatinine, Ser: 1.08 mg/dL (ref 0.61–1.24)
GFR, Estimated: >60 mL/min (ref >60)
Glucose, Bld: 100 mg/dL — ABNORMAL HIGH (ref 70–99)
Potassium: 4.4 mmol/L (ref 3.5–5.1)
Sodium: 134 mmol/L — ABNORMAL LOW (ref 135–145)

## 2024-01-19 LAB — HEPATIC FUNCTION PANEL
ALT: 11 U/L (ref 0–44)
AST: 11 U/L — ABNORMAL LOW (ref 15–41)
Albumin: 3.2 g/dL — ABNORMAL LOW (ref 3.5–5.0)
Alkaline Phosphatase: 97 U/L (ref 38–126)
Bilirubin, Direct: <0.1 mg/dL (ref 0.0–0.2)
Total Bilirubin: 0.7 mg/dL (ref 0.0–1.2)
Total Protein: 6.4 g/dL — ABNORMAL LOW (ref 6.5–8.1)

## 2024-01-19 LAB — TYPE AND SCREEN
ABO/RH(D): O POS
Antibody Screen: NEGATIVE

## 2024-01-19 LAB — SURGICAL PCR SCREEN
MRSA, PCR: NEGATIVE
Staphylococcus aureus: NEGATIVE

## 2024-01-19 LAB — TROPONIN I (HIGH SENSITIVITY)
Troponin I (High Sensitivity): 4 ng/L (ref <18)
Troponin I (High Sensitivity): 4 ng/L (ref <18)

## 2024-01-19 SURGERY — CANCELLED PROCEDURE
Anesthesia: General

## 2024-01-19 MED ORDER — FENTANYL CITRATE (PF) 250 MCG/5ML IJ SOLN
INTRAMUSCULAR | Status: AC
  Filled 2024-01-19: qty 5

## 2024-01-19 MED ORDER — CHLORHEXIDINE GLUCONATE 0.12 % MT SOLN
OROMUCOSAL | Status: AC
  Administered 2024-01-19: 15 mL via OROMUCOSAL
  Filled 2024-01-19: qty 15

## 2024-01-19 MED ORDER — LACTATED RINGERS IV SOLN: INTRAVENOUS | Status: DC

## 2024-01-19 MED ORDER — SODIUM CHLORIDE 0.9 % IV SOLN
INTRAVENOUS | Status: AC | PRN
Start: 2024-01-19 — End: ?

## 2024-01-19 MED ORDER — CHLORHEXIDINE GLUCONATE 0.12 % MT SOLN: 15.0000 mL | OROMUCOSAL | Status: AC

## 2024-01-19 MED ORDER — FENTANYL CITRATE (PF) 50 MCG/ML IJ SOSY
50.0000 ug | PREFILLED_SYRINGE | Freq: Once | INTRAMUSCULAR | Status: AC
  Administered 2024-01-19: 50 ug via INTRAVENOUS
  Filled 2024-01-19: qty 1

## 2024-01-19 MED ORDER — KETAMINE HCL 50 MG/5ML IJ SOSY
PREFILLED_SYRINGE | INTRAMUSCULAR | Status: AC
  Filled 2024-01-19: qty 5

## 2024-01-19 MED ORDER — LACTATED RINGERS IV SOLN: INTRAVENOUS | Status: AC | PRN

## 2024-01-19 MED ORDER — PROPOFOL 10 MG/ML IV BOLUS
INTRAVENOUS | Status: AC
  Filled 2024-01-19: qty 20

## 2024-01-19 MED ORDER — FENTANYL CITRATE (PF) 50 MCG/ML IJ SOSY
25.0000 ug | PREFILLED_SYRINGE | INTRAMUSCULAR | Status: DC | PRN
  Administered 2024-01-19 (×2): 25 ug via INTRAVENOUS
  Filled 2024-01-19 (×2): qty 1

## 2024-01-19 MED ORDER — ACETAMINOPHEN 10 MG/ML IV SOLN
INTRAVENOUS | Status: AC
  Filled 2024-01-19: qty 100

## 2024-01-19 MED ORDER — PREGABALIN 100 MG PO CAPS
100.0000 mg | ORAL_CAPSULE | Freq: Two times a day (BID) | ORAL | Status: DC
  Administered 2024-01-19: 100 mg via ORAL
  Filled 2024-01-19: qty 1

## 2024-01-19 MED ORDER — ONDANSETRON HCL 4 MG/2ML IJ SOLN
INTRAMUSCULAR | Status: AC
  Filled 2024-01-19: qty 2

## 2024-01-19 MED ORDER — ACETAMINOPHEN 500 MG PO TABS: 1000.0000 mg | ORAL_TABLET | Freq: Four times a day (QID) | ORAL | Status: DC | PRN

## 2024-01-19 MED ORDER — HEPARIN 6000 UNIT IRRIGATION SOLUTION
Status: AC
  Filled 2024-01-19: qty 500

## 2024-01-19 MED ORDER — LABETALOL HCL 5 MG/ML IV SOLN: 5.0000 mg | INTRAVENOUS | Status: DC | PRN

## 2024-01-19 MED ORDER — ROCURONIUM BROMIDE 10 MG/ML (PF) SYRINGE
PREFILLED_SYRINGE | INTRAVENOUS | Status: AC
Start: 2024-01-19 — End: 2024-01-19
  Filled 2024-01-19: qty 10

## 2024-01-19 MED ORDER — CLOPIDOGREL BISULFATE 75 MG PO TABS
75.0000 mg | ORAL_TABLET | Freq: Every day | ORAL | Status: DC
  Administered 2024-01-19: 75 mg via ORAL
  Filled 2024-01-19: qty 1

## 2024-01-19 MED ORDER — LIDOCAINE 2% (20 MG/ML) 5 ML SYRINGE
INTRAMUSCULAR | Status: AC
  Filled 2024-01-19: qty 5

## 2024-01-19 NOTE — Progress Notes (Signed)
 Patient brought to [facility] east from short stay.  01/19/24 0918  Vitals  Temp 98.6 F (37 C)  Temp Source Oral  BP (!) 141/69  MAP (mmHg) 90  BP Location Left Arm  BP Method Automatic  Patient Position (if appropriate) Lying  Pulse Rate 66  Pulse Rate Source Monitor  ECG Heart Rate 66  Resp 18  Level of Consciousness  Level of Consciousness Alert  MEWS COLOR  MEWS Score Color Green  Oxygen Therapy  SpO2 96 %  O2 Device Room Air  Pain Assessment  Pain Scale 0-10  Pain Score 10  Pain Type Acute pain  Pain Location Back  Pain Descriptors / Indicators Aching  Pain Frequency Constant  Pain Onset On-going  Patients Stated Pain Goal 2  Pain Intervention(s) Medication (See eMAR)  MEWS Score  MEWS Temp 0  MEWS Systolic 0  MEWS Pulse 0  MEWS RR 0  MEWS LOC 0  MEWS Score 0

## 2024-01-19 NOTE — Care Management CC44 (Signed)
 Condition Code 44 Documentation Completed  Patient Details  Name: Patrick Adams MRN: [id number] Date of Birth: Sep 25, 1945   Condition Code 44 given:  Yes Patient signature on Condition Code 44 notice:  Yes Documentation of 2 MD's agreement:  Yes Code 44 added to claim:  Yes    Charlann Rayfield Hurst, RN 01/19/2024, 12:59 PM

## 2024-01-19 NOTE — Anesthesia Preprocedure Evaluation (Addendum)
 Anesthesia Evaluation  Patient identified by MRN, date of birth, ID band Patient awake    Reviewed: Allergy & Precautions, NPO status , Patient's Chart, lab work & pertinent test results  Airway        Dental   Pulmonary Current Smoker and Patient abstained from smoking. Non small cell lung CA          Cardiovascular hypertension, Pt. on medications + CAD, + Past MI, + Cardiac Stents and + Peripheral Vascular Disease    TTE 2025 1. Left ventricular ejection fraction, by estimation, is 45 to 50%. The  left ventricle has mildly decreased function. The left ventricle  demonstrates regional wall motion abnormalities (see scoring  diagram/findings for description). Left ventricular  diastolic parameters are indeterminate. There is moderate hypokinesis of  the left ventricular, mid-apical anterior wall, anteroseptal wall and  apical segment.   2. Right ventricular systolic function is normal. The right ventricular  size is normal.   3. The mitral valve is normal in structure. Trivial mitral valve  regurgitation. No evidence of mitral stenosis.   4. The aortic valve is tricuspid. Aortic valve regurgitation is not  visualized. Mild to moderate aortic valve sclerosis/calcification is  present, without any evidence of aortic stenosis.   Cath 2019  Non-stenotic Prox LAD to Mid LAD lesion was previously treated.  Non-stenotic Prox LAD lesion was previously treated.  Ost 1st Mrg lesion is 65% stenosed.  Previously placed Post Atrio stent (unknown type) is widely patent.  Previously placed Mid RCA-2 stent (unknown type) is widely patent.  Previously placed Mid RCA-1 stent (unknown type) is widely patent.  Previously placed Prox RCA to Mid RCA stent (unknown type) is widely patent.  Dist LAD lesion is 70% stenosed.  The left ventricular systolic function is normal.  LV end diastolic pressure is normal.  The left ventricular  ejection fraction is 55-65% by visual estimate.   1. No significant obstructive CAD. Moderate disease in very distal LAD. Continued patency of all stents. 2. Good LV function 3. Normal LVEDP      Neuro/Psych  PSYCHIATRIC DISORDERS  Depression    negative neurological ROS     GI/Hepatic negative GI ROS, Neg liver ROS,,,  Endo/Other  negative endocrine ROS    Renal/GU negative Renal ROS  negative genitourinary   Musculoskeletal negative musculoskeletal ROS (+)    Abdominal   Peds  Hematology  (+) Blood dyscrasia (plavix )   Anesthesia Other Findings 78 y.o. 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 Missy and Dr. Luwanna at [facility].  This repair includes physician modified endograft (FEVAR), right sided iliac branch endoprosthesis.  Graft extends from zone 4 of the aorta through the external iliac arteries.  Reproductive/Obstetrics                              Anesthesia Physical Anesthesia Plan  ASA: 3  Anesthesia Plan: General   Post-op Pain Management: Ofirmev  IV (intra-op)*   Induction: Intravenous  PONV Risk Score and Plan: 1 and Dexamethasone , Ondansetron  and Treatment may vary due to age or medical condition  Airway Management Planned: Oral ETT  Additional Equipment: Arterial line  Intra-op Plan:   Post-operative Plan: Extubation in OR  Informed Consent: I have reviewed the patients History and Physical, chart, labs and discussed the procedure including the risks, benefits and alternatives for the proposed anesthesia with the patient  or authorized representative who has indicated his/her understanding and acceptance.     Dental advisory given  Plan Discussed with: CRNA  Anesthesia Plan Comments:          Anesthesia Quick Evaluation

## 2024-01-19 NOTE — Consult Note (Signed)
 Hospital Consult    Reason for Consult: Concern for acute aortic syndrome Requesting Physician: ED MRN #:  [id number]  History of Present Illness: This is a 78 y.o. 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 Missy and Dr. Luwanna at [facility].  This repair includes physician modified endograft (FEVAR), right sided iliac branch endoprosthesis.  Graft extends from zone 4 of the aorta through the external iliac arteries.  Surgical history 2008: open aorto-bi-iliac repair of AAA at [facility]SH 02/23/19: TAAA repair with 4 vessel CMD FEVAR (CA, SMA, RRA, LRA) with Dr. Missy 05/09/19: TEVAR with Bluford Heath 34 x 34 x 209 mm due to type 1a endoleak with Dr. Luwanna 05/12/19: TEVAR proximal cuff extension with Gore 37 mm x 15 cm due to type III endoleak with Dr. Luwanna. 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 Missy.   Patient has a history of metastatic lung cancer with what appears to be lytic bone lesions.  He completed radiation several months ago due to recurrence.  He presents today with a 42-month history of pain in the right flank pain.  He describes this as constant, and he has worked with several physicians in an effort to define the etiology.  Unfortunately, they have been unsuccessful.  The pain is located inferior to the right scapula, and is reproducible with palpation.    Past Medical History:  Diagnosis Date   CAD S/P PCI 01/2018   a) 01/31/2018: p-mLAD 60-100% (DES PCI with 2.75 mm x 38 mm Synergy DES - 4.0-3.6.3.0 mm tapered post-dilation); b) p-mRCA 55%-85%-40% - Synergy DES 3.5 mm x 32 mm -> 4.0 mm; & RPAV 90% --> Synergy DES 2.5 mm x 12 mm -> 3.0 mm;; c) stents patent on re-look Cath 03/19/2018   Diverticulitis    Erectile dysfunction    Hemochromatosis    History of radiation therapy    Right lung-02/18/23-03/06/23- Dr. Lynwood Nasuti   Hyperlipidemia    Hypertension     Ischemic cardiomyopathy 01/31/2018   Initial post STEMI EF by echo 40 to 45% --> by cath 1 month later LV gram showed EF 55 to 60%. -- most Recent Echo: EF 45-50%. Mid-Apical Anterior/Anterospetal HKc.a LAD infarct.   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   Non-small cell lung cancer, right (HCC) 05/2019   Proven by CT-guided biopsy-stage I   PAD (peripheral artery disease)    Prostate nodule    SBO (small bowel obstruction) (HCC) 05/2019   Mostly postop ileus   ST elevation (STEMI) myocardial infarction involving left anterior descending coronary artery (HCC) 01/31/2018   Found to have pLAD 60%-mLAD 100% (DES PCI), distal LAD diffuse 60-70% (med Rx), OM1 65%; also signifiicant p-m RCA & RPAV disease  --> Initial EF ~45% with elevated LVEDP   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 Glean Serve, annual scans -> until summer/fall 2021 -> noted progression to 6.2 mm aneurysm --> 11/25/221 [facility]- Dr. Missy): 4 V branched FEVAR (CA,SMA, R&L Renal) -> 05/09/19: Type 1a Endoleak  - TEVAR & f/u TEVAR cuff Extension (2/11)    Past Surgical History:  Procedure Laterality Date   ABDOMINAL AORTIC ANEURYSM REPAIR  2008   Aortobiiliac repair ->  Dr.  Glean Serve, annual scans   CARDIAC EVENT MONITOR  10/2018   Baseline sinus rhythm with 1 degree AVB.  PVCs noted.  Average heart rate 73 bpm.  3 critical: 1 serious and 8 stable events noted: Critical-4.0-second pauses x3, serious 1 run 6 beat PVCs (NSVT), 3 pauses of 3.2 to 3.5 seconds.  Pauses do have P waves that appear to be blocked on some occasions.  Both junctional escape and normal sinus beats after pauses.   COLONOSCOPY  2016   CORONARY STENT INTERVENTION N/A 02/03/2018   Procedure: CORONARY STENT  INTERVENTION;  Surgeon: Claudene Victory ORN, MD;  Location: [facility] INVASIVE CV LAB;;   staged PCI p-mRCA (Synergy DES 3.5 mm x 32 mm--4.0 mm covering entire diseased segment from  proximal 55 through the 85 and 40% lesions in the mid vessel), RPAV (Synergy DES 2.5 mm x 12 mm--3.0 mm)   CORONARY/GRAFT ACUTE MI REVASCULARIZATION N/A 01/31/2018   Procedure: Coronary/Graft Acute MI Revascularization;  Surgeon: Anner Alm ORN, MD;  Location: [facility] INVASIVE CV LAB;; ANTERIOR-INFERIOR STEMI: pLAD 60% - mLAD 100% (PCI-Synergy DES 2.75 mm at 38 mm--4.1-3.6-3.0 mm tapered post dilation)   ENDOVASCULAR RIGHT INTERNAL ILIAC ANEURYSM REPAIR Right 02/06/2020   [facility]; Dr. Missy - Stent Graft Placed   LEFT HEART CATH AND CORONARY ANGIOGRAPHY N/A 01/31/2018   Procedure: LEFT HEART CATH AND CORONARY ANGIOGRAPHY;  Surgeon: Anner Alm ORN, MD;  Location: [facility] INVASIVE CV LAB;;; (anterior inferior STEMI) -> pLAD 60% - mLAD 100% (DES PCI); p-m RCA 55%, mRCA 85%, mRCA 40% (diffuse segmental disease), RPAV 90% (ulcerated), OM1 65%.  EF 35-45%, moderately elevated LVEDP --acute combined systolic and diastolic heart failure, ischemic cardiomyopathy   LEFT HEART CATH AND CORONARY ANGIOGRAPHY N/A 02/03/2018   Procedure: LEFT HEART CATH AND CORONARY ANGIOGRAPHY;  Surgeon: Claudene Victory ORN, MD;  Location: [facility] INVASIVE CV LAB;  Service: Cardiovascular;  Laterality: N/A;   LEFT HEART CATH AND CORONARY ANGIOGRAPHY N/A 03/19/2018   Procedure: LEFT HEART CATH AND CORONARY ANGIOGRAPHY;  Surgeon: Swaziland, Peter M, MD;  Location: [facility] INVASIVE CV LAB;  Service: Cardiovascular;  Patent stent in p-mLAD, patent p-m RCA stent and RPAV stent.  Distal LAD 70%, OM1 65%.  EF 55-60%.   TEVAR  2/8 & 05/12/2019   [facility]-Dr. Missy: (for type I a endoleak of FEVAR from 01/2019)  05/02/2019: TEVAR - COOK Alpha 34 x 34 x 209 (RFA); 05/12/19 - prox cuff extension TEVAR (Gore 37 mm x 15 mm, LCFA).   THORACOABDOMINAL AORTIC ANEURYSM REPAIR  02/23/2019   [facility]-Dr. Missy: admitted for  lumbar drain insertion followed by aneurysmal repair 4V FEVAR (CA, SMA, RRA, LRA - Cook Branced Device - Bilateral Femoral Access & L Brachial Cutdown).   TRANSTHORACIC ECHOCARDIOGRAM  02/01/2018   (Insetting of anterior-inferior STEMI) --> moderate LVH.  EF 40 to 45%.  Anterior, anteroseptal and apical severe hypokinesis to akinesis.  Indeterminate LV filling pressures.  Aortic sclerosis, no stenosis.   TRANSTHORACIC ECHOCARDIOGRAM  01/17/2019   [facility]: 01/17/2019: EF 55%-normal wall motion..  Mild RV dilation.  Aortic sclerosis but no stenosis   TRANSTHORACIC ECHOCARDIOGRAM  07/2019   EF 45-50%.Moderate HK of mid-apical anterior-anteroseptal wall (c/w LAD MI). Mild-Mod AoV sclerosis w/o Stenosis. .    Allergies  Allergen Reactions   Brilinta  [Ticagrelor ] Shortness Of Breath     Patient was taking 60 mg  Twice a day - changed to Clopidogrel  75 mg  03/02/19   Ciprofloxacin Anaphylaxis   Statins Other (See Comments)  Joint pain.Myalgias -- HAS TRIED LIPITOR, ZOCOR & CRESTOR     Prior to Admission medications   Medication Sig Start Date End Date Taking? Authorizing Provider  acetaminophen  (TYLENOL ) 500 MG tablet Take 1,000 mg by mouth every 6 (six) hours as needed for moderate pain or headache.    [provider]  cholecalciferol (VITAMIN D3) 25 MCG (1000 UT) tablet Take 1,000 Units by mouth daily.    [provider]  clopidogrel  (PLAVIX ) 75 MG tablet Take 1 tablet (75 mg total) by mouth daily. 02/25/23   Jerilynn Lamarr HERO, NP  ibuprofen (ADVIL) 200 MG tablet Take 200 mg by mouth 2 (two) times daily. As needed    [provider]  lidocaine  (LIDODERM ) 5 % Place 1 patch onto the skin daily. Remove & Discard patch within 12 hours or as directed by MD 05/12/23   Timmy Maude SAUNDERS, MD  losartan  (COZAAR ) 100 MG tablet Take 50 mg by mouth. 03/18/21   [provider]  methocarbamol (ROBAXIN) 500 MG tablet Take 500 mg by mouth 3 (three) times daily as  needed. 11/16/23   [provider]  nitroGLYCERIN  (NITROSTAT ) 0.4 MG SL tablet Place 1 tablet (0.4 mg total) under the tongue every 5 (five) minutes as needed for chest pain. 06/02/23 06/01/24  Anner Alm ORN, MD  pantoprazole  (PROTONIX ) 40 MG tablet Take 40 mg by mouth daily.    [provider]  polyethylene glycol (MIRALAX  / GLYCOLAX ) 17 g packet Take 17 g by mouth daily as needed (Constipation). 05/24/19   Frann Mabel Mt, DO  pregabalin  (LYRICA ) 100 MG capsule Take 1 capsule (100 mg total) by mouth 2 (two) times daily. 10/19/23   Timmy Maude SAUNDERS, MD  sildenafil  (VIAGRA ) 100 MG tablet Take 1 tablet (100 mg total) by mouth daily as needed for erectile dysfunction. 09/10/20   Anner Alm ORN, MD  sildenafil  (VIAGRA ) 100 MG tablet Take 1 tablet (100 mg total) by mouth daily as needed for erectile dysfunction. 11/23/23   Timmy Maude SAUNDERS, MD  simethicone  (MYLICON) 80 MG chewable tablet Chew 80 mg by mouth as needed for flatulence.    [provider]    Social History   Socioeconomic History   Marital status: Single    Spouse name: Not on file   Number of children: Not on file   Years of education: Not on file   Highest education level: Not on file  Occupational History   Not on file  Tobacco Use   Smoking status: Every Day    Current packs/day: 1.00    Types: Cigarettes   Smokeless tobacco: Never  Vaping Use   Vaping status: Never Used  Substance and Sexual Activity   Alcohol use: No   Drug use: No   Sexual activity: Not Currently    Partners: Female    Comment: Divorced  Other Topics Concern   Not on file  Social History Narrative   Divorced, retired   Lives alone   1 daughter   Smoker   No drugs/EtOH   Social Drivers of Corporate investment banker Strain: Low Risk  (04/07/2023)   Received from [facility]   Overall Financial Resource Strain (CARDIA)    Difficulty of Paying Living Expenses: Not hard at all  Food Insecurity: No Food  Insecurity (04/07/2023)   Received from [facility]   Hunger Vital Sign    Within the past 12 months, you worried that your food would run out before you got the money  to buy more.: Never true    Within the past 12 months, the food you bought just didn't last and you didn't have money to get more.: Never true  Transportation Needs: No Transportation Needs (04/07/2023)   Received from [facility]   PRAPARE - Transportation    Lack of Transportation (Medical): No    Lack of Transportation (Non-Medical): No  Physical Activity: Unknown (04/07/2023)   Received from [facility]   Exercise Vital Sign    On average, how many days per week do you engage in moderate to strenuous exercise (like a brisk walk)?: 0 days    Minutes of Exercise per Session: Not on file  Stress: No Stress Concern Present (04/07/2023)   Received from [facility] - Occupational Stress Questionnaire    Feeling of Stress : Only a little  Social Connections: Socially Integrated (04/07/2023)   Received from [facility]   Social Network    How would you rate your social network (family, work, friends)?: Good participation with social networks  Intimate Partner Violence: Not At Risk (04/07/2023)   Received from [facility]   HITS    Over the last 12 months how often did your partner physically hurt you?: Never    Over the last 12 months how often did your partner insult you or talk down to you?: Never    Over the last 12 months how often did your partner threaten you with physical harm?: Never    Over the last 12 months how often did your partner scream or curse at you?: Never   Family History  Problem Relation Age of Onset   Colonic polyp Mother    Heart disease Father    Liver disease Father     ROS: Otherwise negative unless mentioned in HPI  Physical Examination  Vitals:   01/18/24 1925 01/18/24 2327  BP: (!) 177/88 (!) 145/83  Pulse: 78 67  Resp: 18 16  Temp: 97.7  F (36.5 C) 98 F (36.7 C)  SpO2: 98% 95%   Body mass index is 21.7 kg/m.  General:  WDWN in NAD Gait: Not observed HENT: WNL, normocephalic Pulmonary: normal non-labored breathing, without Rales, rhonchi,  wheezing Cardiac: regular, Abdomen:  soft, NT/ND, no masses Skin: without rashes Vascular Exam/Pulses: 2+ femorals, 2+Dps Extremities: without ischemic changes, without Gangrene , without cellulitis; without open wounds;  Musculoskeletal: no muscle wasting or atrophy  Neurologic: A&O X 3;  No focal weakness or paresthesias are detected; speech is fluent/normal Psychiatric:  The pt has Normal affect. Lymph:  Unremarkable  CBC    Component Value Date/Time   WBC 8.8 01/18/2024 2013   RBC 4.64 01/18/2024 2013   HGB 14.1 01/18/2024 2013   HGB 13.8 11/23/2023 0853   HGB CANCELED 03/18/2018 1146   HCT 42.6 01/18/2024 2013   HCT CANCELED 03/18/2018 1146   PLT 328 01/18/2024 2013   PLT 261 11/23/2023 0853   PLT CANCELED 03/18/2018 1146   MCV 91.8 01/18/2024 2013   MCV 88 03/18/2018 0000   MCH 30.4 01/18/2024 2013   MCHC 33.1 01/18/2024 2013   RDW 13.0 01/18/2024 2013   RDW 14.6 03/18/2018 0000   LYMPHSABS 0.3 (L) 01/18/2024 2013   MONOABS 0.1 01/18/2024 2013   EOSABS 0.0 01/18/2024 2013   BASOSABS 0.0 01/18/2024 2013    BMET    Component Value Date/Time   NA 135 01/18/2024 2013   NA 138 04/11/2021 1130   K 4.9 01/18/2024  Feb 23, 2012   CL 104 01/18/2024 2013   CO2 17 (L) 01/18/2024 2013   GLUCOSE 140 (H) 01/18/2024 2013   BUN 20 01/18/2024 2013   BUN 20 04/11/2021 1130   CREATININE 1.10 01/18/2024 2013   CREATININE 1.21 11/23/2023 0853   CALCIUM  9.3 01/18/2024 2013   GFRNONAA >60 01/18/2024 2013   GFRNONAA >60 11/23/2023 0853   GFRAA >60 05/24/2019 0204    COAGS: Lab Results  Component Value Date   INR 1.0 01/18/2024   INR 1.1 05/07/2019   INR 1.15 02/02/2018       ASSESSMENT/PLAN: This is a 78 y.o. male with extensive aortic repair history including  FEVAR, extended proximally due to endoleak,, as well as distally into the right hypogastric artery due to expanding aneurysm.  Unfortunately, I do not have imaging from [facility].  I have evaluated notes from Dr. Missy, as well as notes from his oncologist.  It appears that the aneurysm was 8.6 x 7 at its largest.  On exam today, it is measuring much smaller, 7.6 cm in the abdomen. There is a question of endoleak, however our protocol does not include a noncontrasted scan, so that I cannot evaluate whether this contrast is hung up from previous operations.  There is present in both arterial and venous phase and what appears to be a previous embolization site.  The proximal aspect of his endovascular aortic repair demonstrates a type Ia endoleak.  There appears to have been a small leak present as far back as 2022/02/22.  SABRA  It is more pronounced on scan today, with a very small amount of growth within the descending thoracic aorta - 4 cm to 4.5 cm in the last 2 years.  It appears as though the leak ends and mural thrombus.  I do not think that this is related to his right sided back pain, which has been present for 4 months, however the back pain continues to worsen, and I think the only way to completely rule this out is treatment.  I had a long conversation with Gillis regarding the above.  From a vascular standpoint, I think that the prior repair needs to be extended proximally to exclude the type Ia endoleak which has caused a small amount of growth in the descending thoracic aorta.  This is is not acute, and could be followed with Dr. Missy.  I gave Daegon the option of calling Dr. Missy and setting up a new appointment prior to his appointment in December to discuss the findings of today's visit.  I think the 1A endoleak can be repaired electively. Cas is getting admitted for his back pain, therefore I discussed that I am also happy to treat this lesion with extension of his aortic endograft tomorrow.   After discussing both options, Dimitrius felt more comfortable having the lesion repaired while in house.  After discussing the risks and benefits, Christiano elected to proceed.  He understands that this comes with a risk of paralysis.  My plan is to call Dr. Missy in the AM to discuss Tyberius's case prior to TEVAR. Please make n.p.o. midnight  Fonda FORBES Rim MD [facility] [phone] 01/19/2024  1:17 AM

## 2024-01-19 NOTE — Care Management Obs Status (Signed)
 MEDICARE OBSERVATION STATUS NOTIFICATION   Patient Details  Name: Patrick Adams MRN: [id number] Date of Birth: May 19, 1945   Medicare Observation Status Notification Given:  Yes    Charlann Rayfield Hurst, RN 01/19/2024, 12:58 PM

## 2024-01-19 NOTE — Discharge Summary (Signed)
 Physician Discharge Summary  Patrick Adams [name]:[id number] DOB: 1945-05-24 DOA: 01/18/2024  PCP: Lemon Patrick Flavors, MD  Admit date: 01/18/2024 Discharge date: 01/19/24  Admitted From: Home Disposition: Home Recommendations for Outpatient Follow-up:  Outpatient follow-up with PCP, vascular surgery and oncology Consider palliative consultation for symptom management outpatient Please follow up on the following pending results: None  Home Health: No need identified Equipment/Devices: No need identified  Discharge Condition: Stable CODE STATUS: DNR Diet Orders (From admission, onward)     Start     Ordered   01/19/24 1209  Diet regular Room service appropriate? Yes; Fluid consistency: Thin  Diet effective now       Question Answer Comment  Room service appropriate? Yes   Fluid consistency: Thin      01/19/24 1209             Follow-up Information     Toborg, Patrick Flavors, MD .   Specialty: Family Medicine Contact information: 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 Timmy, CAD/PCI, ICM, AAA, HTN and ongoing tobacco use presenting with low back pain.  Reportedly had burning low back pain for the last 4 months with pain in the right side rib also.  On admission, he reported worsening back pain for the last 4 to 5 days with no difficulty walking or localized weakness or bowel or bladder habit change.  CT angio chest, abdomen and pelvis showed type I endoleak of the thoracic aortic stent with slight interval enlargement of the excluded descending thoracic aneurysm sac and excluded abdominal aortic aneurysm now measuring 7.7 cm (was 7.1 cm previously) and large loculated right pleural effusion.  Vascular surgery consulted.   The next day, evaluated by vascular surgery.  After discussion with his primary vascular surgeon at [facility],  Dr. Missy, it was felt that intervention comes with significant risk due to four-vessel fenestration and risk of dislodging multiple stents during the proximal repair.  The consensus was to continue following outpatient.  Patient is cleared for discharge by vascular surgery.  On my evaluation, patient reports back pain stating that this is chronic issue for him.  He said there is no acute change and he has been managing this with pain medication at home.  He is eager to go home.  In regards to loculated right pleural effusion which is likely from underlying lung cancer.  Patient has no respiratory distress.  See individual problem list below for more.   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           Consultations: Vascular surgery  Time spent 35  minutes  Vital signs Vitals:   01/19/24 0545 01/19/24 0621 01/19/24 0625 01/19/24 0918  BP: 137/69 (!) 155/70  (!) 141/69  Pulse: (!) 59 (!) 59  66  Temp:  98.1 F (36.7 C)  98.6 F (  37 C)  Resp: 15 18  18   Height:  6' (1.829 m) 6' (1.829 m)   Weight:  72.6 kg 72.6 kg   SpO2: 95% 93%  96%  TempSrc:  Oral  Oral  BMI (Calculated):  21.7 21.7      Discharge exam  GENERAL: No apparent distress.  Nontoxic. HEENT: MMM.  Vision and hearing grossly intact.  NECK: Supple.  No apparent JVD.  RESP:  No IWOB.  Fair aeration bilaterally. CVS:  RRR. Heart sounds normal.  ABD/GI/GU: BS+. Abd soft, NTND.  MSK/EXT:  Moves extremities. No apparent deformity. No  edema.  SKIN: no apparent skin lesion or wound NEURO: Awake and alert. Oriented appropriately.  No apparent focal neuro deficit. PSYCH: Calm. Normal affect.   Discharge Instructions Discharge Instructions     Discharge instructions   Complete by: As directed    Follow up with your vascular surgeon at [facility] Follow up with your Oncologist Not that Ibuprofen can increase your risk of bleeding   Increase activity slowly   Complete by: As directed       Allergies as of 01/19/2024       Reactions   Brilinta  [ticagrelor ] Shortness Of Breath    Patient was taking 60 mg  Twice a day - changed to Clopidogrel  75 mg  03/02/19   Ciprofloxacin Anaphylaxis   Alirocumab  Other (See Comments)   Fatigue, diarrhea **Praluent **   Statins Other (See Comments)   Joint pain.Myalgias -- HAS TRIED LIPITOR, ZOCOR & CRESTOR         Medication List     TAKE these medications    acetaminophen  500 MG tablet Commonly known as: TYLENOL  Take 1,000 mg by mouth every 6 (six) hours as needed for moderate pain or headache.   cholecalciferol 25 MCG (1000 UNIT) tablet Commonly known as: VITAMIN D3 Take 1,000 Units by mouth daily.   clopidogrel  75 MG tablet Commonly known as: PLAVIX  Take 1 tablet (75 mg total) by mouth daily.   ibuprofen 200 MG tablet Commonly known as: ADVIL Take 200 mg by mouth every 4 (four) hours. As needed   lidocaine  5 % Commonly known as: LIDODERM  Place 1 patch onto the skin daily. Remove & Discard patch within 12 hours or as directed by MD   losartan  100 MG tablet Commonly known as: COZAAR  Take 50 mg by mouth at bedtime.   nitroGLYCERIN  0.4 MG SL tablet Commonly known as: Nitrostat  Place 1 tablet (0.4 mg total) under the tongue every 5 (five) minutes as needed for chest pain.   polyethylene glycol 17 g packet Commonly known as: MIRALAX  / GLYCOLAX  Take 17 g by mouth daily as needed (Constipation).   pregabalin  100 MG capsule Commonly known as: Lyrica  Take 1 capsule  (100 mg total) by mouth 2 (two) times daily.   sildenafil  100 MG tablet Commonly known as: VIAGRA  Take 1 tablet (100 mg total) by mouth daily as needed for erectile dysfunction.   sildenafil  100 MG tablet Commonly known as: Viagra  Take 1 tablet (100 mg total) by mouth daily as needed for erectile dysfunction.   simethicone  80 MG chewable tablet Commonly known as: MYLICON Chew 80 mg by mouth as needed for flatulence.         Procedures/Studies:   HYBRID OR IMAGING ([facility] ONLY) Result Date: 01/19/2024 There is no interpretation for this exam.  This order is for images obtained during a surgical procedure.  Please See Surgeries Tab for more information regarding the procedure.   CT Angio  Chest/Abd/Pel for Dissection W and/or Wo Contrast Result Date: 01/18/2024 EXAM: CTA CHEST, ABDOMEN AND PELVIS WITHOUT AND WITH CONTRAST 01/18/2024 10:37:45 PM TECHNIQUE: CTA of the chest was performed without and with the administration of 100 mL of intravenous contrast (iohexol  (OMNIPAQUE ) 350 MG/ML injection). CTA of the abdomen and pelvis was performed without and with the administration of 100 mL of intravenous contrast (iohexol  (OMNIPAQUE ) 350 MG/ML injection). Multiplanar reformatted images are provided for review. MIP images are provided for review. Automated exposure control, iterative reconstruction, and/or weight based adjustment of the mA/kV was utilized to reduce the radiation dose to as low as reasonably achievable. COMPARISON: CT 12/31/2021, CT 07/15/2022, and PET/CT 11/09/2023. 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 Right-sided back pain for 4 months. FINDINGS: VASCULATURE: AORTA: Postoperative change of thoracic and abdominal endovascular aortic repair with fenestrated graft repair  of the abdominal aortic aneurysm. Thoracic Aorta: Type Ia endoleak of the thoracic aortic stent. This leak is more anterior compared to the leak seen on 12/31/2021. The diameter of the excluded aneurysm sac in the proximal descending thoracic aorta has increased in size from 12/31/2021, measuring 44 mm today, previously 42 mm. The distal descending thoracic aorta is unchanged in size, again measuring 46 mm in diameter. The stent grafts are widely patent. Patent fenestrated grafts for the celiac axis, SMA, and bilateral renal arteries. Abdominal Aorta: Excluded aneurysm sac in the abdomen has a maximum diameter of 7.7 cm. This has increased from 07/15/2022 when it measured 7.1 cm. No evidence of endoleak. Aortobiiliac endograft with patent bilateral common and external iliac arteries. PULMONARY ARTERIES: No pulmonary embolism with the limits of this exam. GREAT VESSELS OF AORTIC ARCH: No acute finding. No dissection. No arterial occlusion or significant stenosis. ILIAC ARTERIES: Aortobiiliac endograft with patent bilateral common and external iliac arteries. Patent right internal iliac artery stent across a excluded aneurysm . 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 CHEST: MEDIASTINUM: No mediastinal lymphadenopathy. The heart and pericardium demonstrate no acute abnormality. LUNGS AND PLEURA: Large loculated right pleural effusion. Post-radiation fibrosis and atelectasis in the right lung. Status post right lower lobectomy. No pneumothorax. THORACIC BONES AND SOFT TISSUES: No acute bone or soft tissue abnormality. ABDOMEN AND PELVIS: LIVER: Indeterminate 0.9 cm arterial hyper-enhancing lesion in the anterior right hepatic lobe (series 5, image 143). GALLBLADDER AND BILE DUCTS: Cholecystectomy. No biliary ductal dilatation. SPLEEN: The spleen is unremarkable. PANCREAS: The pancreas  is unremarkable. ADRENAL GLANDS: Bilateral adrenal glands demonstrate no acute abnormality. KIDNEYS, URETERS AND BLADDER: No stones in the kidneys or ureters. No hydronephrosis. No perinephric or periureteral stranding. Urinary bladder is unremarkable. GI AND BOWEL: Stomach and duodenal sweep demonstrate no acute abnormality. There is no bowel obstruction. No abnormal bowel wall thickening or distension. REPRODUCTIVE: Enlarged prostate. PERITONEUM AND RETROPERITONEUM: No ascites or free air. LYMPH NODES: No lymphadenopathy. ABDOMINAL BONES AND SOFT TISSUES: No acute abnormality of the bones. No acute soft tissue abnormality. IMPRESSION: 1. Type 1a endoleak of the thoracic aortic stent, more anterior than on 12/31/21, with slight interval enlargement of the excluded proximal descending thoracic aneurysm sac (44 mm, previously 42 mm). 2. Excluded abdominal aortic aneurysm now 7.7 cm (previously 7.1 cm on 07/15/22); no evidence of endoleak. 3. Large  loculated right pleural effusion, increased from 11/09/23. Electronically signed by: Norman Gatlin MD 01/18/2024 11:10 PM EDT RP Workstation: [id number]       The results of significant diagnostics from this hospitalization (including imaging, microbiology, ancillary and laboratory) are listed below for reference.     Microbiology: Recent Results (from the past 240 hours)  Surgical pcr screen     Status: None   Collection Time: 01/19/24  6:00 AM   Specimen: Nasal Mucosa; Nasal Swab  Result Value Ref Range Status   MRSA, PCR NEGATIVE NEGATIVE Final   Staphylococcus aureus NEGATIVE NEGATIVE Final    Comment: (NOTE) The Xpert SA Assay (FDA approved for NASAL specimens in patients 53 years of age and older), is one component of a comprehensive surveillance program. It is not intended to diagnose infection nor to guide or monitor treatment. Performed at [facility], [address] [place], [place] [address]      Labs:  CBC: Recent Labs  Lab  01/18/24 2013 01/19/24 0947  WBC 8.8 10.0  NEUTROABS 8.3* 9.0*  HGB 14.1 12.8*  HCT 42.6 37.5*  MCV 91.8 88.9  PLT 328 333   BMP &GFR Recent Labs  Lab 01/18/24 2013 01/19/24 0947  NA 135 134*  K 4.9 4.4  CL 104 105  CO2 17* 20*  GLUCOSE 140* 100*  BUN 20 20  CREATININE 1.10 1.08  CALCIUM  9.3 8.9   Estimated Creatinine Clearance: 59.8 mL/min (by C-G formula based on SCr of 1.08 mg/dL). Liver & Pancreas: Recent Labs  Lab 01/18/24 2013 01/19/24 0947  AST 13* 11*  ALT 12 11  ALKPHOS 111 97  BILITOT 0.7 0.7  PROT 7.0 6.4*  ALBUMIN 3.5 3.2*   No results for input(s): LIPASE, AMYLASE in the last 168 hours. No results for input(s): AMMONIA in the last 168 hours. Diabetic: No results for input(s): HGBA1C in the last 72 hours. No results for input(s): GLUCAP in the last 168 hours. Cardiac Enzymes: No results for input(s): CKTOTAL, CKMB, CKMBINDEX, TROPONINI in the last 168 hours. No results for input(s): PROBNP in the last 8760 hours. Coagulation Profile: Recent Labs  Lab 01/18/24 2013  INR 1.0   Thyroid Function Tests: No results for input(s): TSH, T4TOTAL, FREET4, T3FREE, THYROIDAB in the last 72 hours. Lipid Profile: No results for input(s): CHOL, HDL, LDLCALC, TRIG, CHOLHDL, LDLDIRECT in the last 72 hours. Anemia Panel: No results for input(s): VITAMINB12, FOLATE, FERRITIN, TIBC, IRON, RETICCTPCT in the last 72 hours. Urine analysis:    Component Value Date/Time   COLORURINE YELLOW 07/15/2022 1645   APPEARANCEUR CLEAR 07/15/2022 1645   LABSPEC >1.046 (H) 07/15/2022 1645   PHURINE 6.0 07/15/2022 1645   GLUCOSEU NEGATIVE 07/15/2022 1645   HGBUR NEGATIVE 07/15/2022 1645   BILIRUBINUR NEGATIVE 07/15/2022 1645   KETONESUR NEGATIVE 07/15/2022 1645   PROTEINUR 30 (A) 07/15/2022 1645   NITRITE NEGATIVE 07/15/2022 1645   LEUKOCYTESUR NEGATIVE 07/15/2022 1645   Sepsis Labs: Invalid input(s):  PROCALCITONIN, LACTICIDVEN   SIGNED:  Mignon ONEIDA Bump, MD  [facility] 01/19/2024, 2:04 PM

## 2024-01-19 NOTE — Progress Notes (Signed)
 Patient seen and examined this morning.  Prior to this, I called his primary vascular surgeon, Dr. Oneil Phlegm to discuss imaging findings from last night.  As noted at the time of consultation, I did not have prior imaging, the most recent of which was completed in May.  Dr. Phlegm was aware of the proximal aortic lesion.  The graft used, was a Terumo relay, which has a portion of bare-metal prior to transitioning into the graft.  Dr. Phlegm not have access to our imaging, but stated the lesion had been followed for quite some time.  Intervention comes with significant risk as outlined previously, due to the four-vessel fenestration, and risk of dislodging multiple stents during the proximal repair.  We both agreed that the lesion can continue to be followed, especially as the patient's back pain is musculoskeletal with point tenderness below the right scapula.  I have canceled Patrick Adams's case.  Patient can continue follow-up with Dr. Phlegm at [facility].  - Cheryle Rim MD

## 2024-01-19 NOTE — ED Notes (Signed)
 Short stay nurse called to retrieve report. Patient ready for transport.

## 2024-01-19 NOTE — Anesthesia Procedure Notes (Signed)
 Arterial Line Insertion Start/End10/21/2025 6:30 AM, 01/19/2024 6:35 AM Performed by: Harrold Macintosh, CRNA, CRNA  Patient location: Pre-op. Preanesthetic checklist: patient identified, IV checked, site marked, risks and benefits discussed, surgical consent, monitors and equipment checked, pre-op evaluation, timeout performed and anesthesia consent Lidocaine  1% used for infiltration Right, radial was placed Catheter size: 20 G Hand hygiene performed  and maximum sterile barriers used  Allen's test indicative of satisfactory collateral circulation Attempts: 1 Following insertion, Biopatch and dressing applied. Post procedure assessment: normal and unchanged  Patient tolerated the procedure well with no immediate complications.

## 2024-01-19 NOTE — H&P (Signed)
 History and Physical    Patrick Adams FMW:[id number] DOB: 08/01/1945 DOA: 01/18/2024  Patient coming from: Home.  Chief Complaint: Low back pain.  HPI: Patrick Adams is a 78 y.o. male with history of metastatic non-small cell lung cancer being followed by Dr. Maude Crease presently on Xgeva  subcutaneous every 3 months, CAD status post PCI, hypertension, ongoing tobacco abuse, ischemic cardiomyopathy with extensive aortic repair history presents with low back pain.  Patient states he has burning low back pain for the last 4 months with pain in the right side ribs also.  The low back pain has acutely worsened over the last 4 to 5 days with no difficulty walking or any weakness of the lower extremities or incontinence of urine or bowel.  Denies any fever or chills.  ED Course: In the ER patient had a CT angiogram chest abdomen pelvis which shows type I endoleak of the thoracic aortic stent with slight interval enlargement of the excluded descending thoracic aneurysm sac.  Excluded abdominal aortic aneurysm now 7.7 cm previously it was 7.1 cm.  Large loculated right pleural effusion.  ER physician discussed with the on-call vascular surgeon Dr. Silver who is planning to do procedure later today.  Admitted for further workup.  Labs are largely unremarkable.  Patient was given pain relief medications.   Review of Systems: As per HPI, rest all negative.   Past Medical History:  Diagnosis Date   CAD S/P PCI 01/2018   a) 01/31/2018: p-mLAD 60-100% (DES PCI with 2.75 mm x 38 mm Synergy DES - 4.0-3.6.3.0 mm tapered post-dilation); b) p-mRCA 55%-85%-40% - Synergy DES 3.5 mm x 32 mm -> 4.0 mm; & RPAV 90% --> Synergy DES 2.5 mm x 12 mm -> 3.0 mm;; c) stents patent on re-look Cath 03/19/2018   Diverticulitis    Erectile dysfunction    Hemochromatosis    History of radiation therapy    Right lung-02/18/23-03/06/23- Dr. Lynwood Nasuti   Hyperlipidemia    Hypertension    Ischemic cardiomyopathy  01/31/2018   Initial post STEMI EF by echo 40 to 45% --> by cath 1 month later LV gram showed EF 55 to 60%. -- most Recent Echo: EF 45-50%. Mid-Apical Anterior/Anterospetal HKc.a LAD infarct.   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   Non-small cell lung cancer, right (HCC) 05/2019   Proven by CT-guided biopsy-stage I   PAD (peripheral artery disease)    Prostate nodule    SBO (small bowel obstruction) (HCC) 05/2019   Mostly postop ileus   ST elevation (STEMI) myocardial infarction involving left anterior descending coronary artery (HCC) 01/31/2018   Found to have pLAD 60%-mLAD 100% (DES PCI), distal LAD diffuse 60-70% (med Rx), OM1 65%; also signifiicant p-m RCA & RPAV disease  --> Initial EF ~45% with elevated LVEDP   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 Glean Serve, annual scans -> until summer/fall 2021 -> noted progression to 6.2 mm aneurysm --> 11/25/221 [facility]- Dr. Missy): 4 V branched FEVAR (CA,SMA, R&L Renal) -> 05/09/19: Type 1a Endoleak  - TEVAR & f/u TEVAR cuff Extension (2/11)    Past Surgical History:  Procedure Laterality Date   ABDOMINAL AORTIC ANEURYSM REPAIR  2008   Aortobiiliac repair ->  Dr. Glean Serve, annual scans   CARDIAC  EVENT MONITOR  10/2018   Baseline sinus rhythm with 1 degree AVB.  PVCs noted.  Average heart rate 73 bpm.  3 critical: 1 serious and 8 stable events noted: Critical-4.0-second pauses x3, serious 1 run 6 beat PVCs (NSVT), 3 pauses of 3.2 to 3.5 seconds.  Pauses do have P waves that appear to be blocked on some occasions.  Both junctional escape and normal sinus beats after pauses.   COLONOSCOPY  2016   CORONARY STENT INTERVENTION N/A 02/03/2018   Procedure: CORONARY STENT INTERVENTION;  Surgeon: Claudene Victory ORN, MD;  Location: [facility] INVASIVE CV LAB;;   staged PCI p-mRCA (Synergy DES 3.5 mm x 32 mm--4.0 mm covering entire diseased segment from  proximal 55 through the 85 and 40% lesions in the mid vessel), RPAV (Synergy DES 2.5 mm x 12 mm--3.0 mm)   CORONARY/GRAFT ACUTE MI REVASCULARIZATION N/A 01/31/2018   Procedure: Coronary/Graft Acute MI Revascularization;  Surgeon: Anner Alm ORN, MD;  Location: [facility] INVASIVE CV LAB;; ANTERIOR-INFERIOR STEMI: pLAD 60% - mLAD 100% (PCI-Synergy DES 2.75 mm at 38 mm--4.1-3.6-3.0 mm tapered post dilation)   ENDOVASCULAR RIGHT INTERNAL ILIAC ANEURYSM REPAIR Right 02/06/2020   [facility]; Dr. Missy - Stent Graft Placed   LEFT HEART CATH AND CORONARY ANGIOGRAPHY N/A 01/31/2018   Procedure: LEFT HEART CATH AND CORONARY ANGIOGRAPHY;  Surgeon: Anner Alm ORN, MD;  Location: [facility] INVASIVE CV LAB;;; (anterior inferior STEMI) -> pLAD 60% - mLAD 100% (DES PCI); p-m RCA 55%, mRCA 85%, mRCA 40% (diffuse segmental disease), RPAV 90% (ulcerated), OM1 65%.  EF 35-45%, moderately elevated LVEDP --acute combined systolic and diastolic heart failure, ischemic cardiomyopathy   LEFT HEART CATH AND CORONARY ANGIOGRAPHY N/A 02/03/2018   Procedure: LEFT HEART CATH AND CORONARY ANGIOGRAPHY;  Surgeon: Claudene Victory ORN, MD;  Location: [facility] INVASIVE CV LAB;  Service: Cardiovascular;  Laterality: N/A;   LEFT HEART CATH AND CORONARY ANGIOGRAPHY N/A 03/19/2018   Procedure: LEFT HEART CATH AND CORONARY ANGIOGRAPHY;  Surgeon: [place], Peter M, MD;  Location: [facility] INVASIVE CV LAB;  Service: Cardiovascular;  Patent stent in p-mLAD, patent p-m RCA stent and RPAV stent.  Distal LAD 70%, OM1 65%.  EF 55-60%.   TEVAR  2/8 & 05/12/2019   [facility]-Dr. Missy: (for type I a endoleak of FEVAR from 01/2019)  05/02/2019: TEVAR - COOK Alpha 34 x 34 x 209 (RFA); 05/12/19 - prox cuff extension TEVAR (Gore 37 mm x 15 mm, LCFA).   THORACOABDOMINAL AORTIC ANEURYSM REPAIR  02/23/2019   [facility]-Dr. Missy: admitted for lumbar drain insertion followed  by aneurysmal repair 4V FEVAR (CA, SMA, RRA, LRA - Cook Branced Device - Bilateral Femoral Access & L Brachial Cutdown).   TRANSTHORACIC ECHOCARDIOGRAM  02/01/2018   (Insetting of anterior-inferior STEMI) --> moderate LVH.  EF 40 to 45%.  Anterior, anteroseptal and apical severe hypokinesis to akinesis.  Indeterminate LV filling pressures.  Aortic sclerosis, no stenosis.   TRANSTHORACIC ECHOCARDIOGRAM  01/17/2019   [facility]: 01/17/2019: EF 55%-normal wall motion..  Mild RV dilation.  Aortic sclerosis but no stenosis   TRANSTHORACIC ECHOCARDIOGRAM  07/2019   EF 45-50%.Moderate HK of mid-apical anterior-anteroseptal wall (c/w LAD MI). Mild-Mod AoV sclerosis w/o Stenosis. SABRA     reports that he has been smoking cigarettes. He has never used smokeless tobacco. He reports that he does not drink alcohol and does not use drugs.  Allergies  Allergen Reactions   Brilinta  [Ticagrelor ] Shortness Of Breath     Patient was taking 60 mg  Twice a day -  changed to Clopidogrel  75 mg  03/02/19   Ciprofloxacin Anaphylaxis   Alirocumab  Other (See Comments)    Fatigue, diarrhea **Praluent **   Statins Other (See Comments)    Joint pain.Myalgias -- HAS TRIED LIPITOR, ZOCOR & CRESTOR     Family History  Problem Relation Age of Onset   Colonic polyp Mother    Heart disease Father    Liver disease Father     Prior to Admission medications   Medication Sig Start Date End Date Taking? Authorizing Provider  acetaminophen  (TYLENOL ) 500 MG tablet Take 1,000 mg by mouth every 6 (six) hours as needed for moderate pain or headache.   Yes [provider]  cholecalciferol (VITAMIN D3) 25 MCG (1000 UT) tablet Take 1,000 Units by mouth daily.   Yes [provider]  clopidogrel  (PLAVIX ) 75 MG tablet Take 1 tablet (75 mg total) by mouth daily. 02/25/23  Yes Jerilynn Lamarr HERO, NP  ibuprofen (ADVIL) 200 MG tablet Take 200 mg by mouth every 4 (four) hours. As needed   Yes [provider]   lidocaine  (LIDODERM ) 5 % Place 1 patch onto the skin daily. Remove & Discard patch within 12 hours or as directed by MD 05/12/23  Yes Ennever, Maude SAUNDERS, MD  losartan  (COZAAR ) 100 MG tablet Take 50 mg by mouth at bedtime. 03/18/21  Yes [provider]  polyethylene glycol (MIRALAX  / GLYCOLAX ) 17 g packet Take 17 g by mouth daily as needed (Constipation). 05/24/19  Yes Frann Mabel Mt, DO  pregabalin  (LYRICA ) 100 MG capsule Take 1 capsule (100 mg total) by mouth 2 (two) times daily. 10/19/23  Yes Timmy Maude SAUNDERS, MD  simethicone  (MYLICON) 80 MG chewable tablet Chew 80 mg by mouth as needed for flatulence.   Yes [provider]  nitroGLYCERIN  (NITROSTAT ) 0.4 MG SL tablet Place 1 tablet (0.4 mg total) under the tongue every 5 (five) minutes as needed for chest pain. Patient not taking: Reported on 01/19/2024 06/02/23 06/01/24  Anner Alm ORN, MD  sildenafil  (VIAGRA ) 100 MG tablet Take 1 tablet (100 mg total) by mouth daily as needed for erectile dysfunction. 09/10/20   Anner Alm ORN, MD  sildenafil  (VIAGRA ) 100 MG tablet Take 1 tablet (100 mg total) by mouth daily as needed for erectile dysfunction. Patient not taking: Reported on 01/19/2024 11/23/23   Timmy Maude SAUNDERS, MD    Physical Exam: Constitutional: Moderately built and nourished. Vitals:   01/18/24 1925 01/18/24 2327 01/19/24 0245 01/19/24 0340  BP: (!) 177/88 (!) 145/83 135/72   Pulse: 78 67 (!) 59   Resp: 18 16 13    Temp: 97.7 F (36.5 C) 98 F (36.7 C)  98.3 F (36.8 C)  TempSrc: Oral   Temporal  SpO2: 98% 95% 93%   Weight:      Height:       Eyes: Anicteric no pallor. ENMT: No discharge from the ears eyes nose or mouth. Neck: No mass felt.  No neck rigidity. Respiratory: No rhonchi or crepitations. Cardiovascular: S1-S2 heard. Abdomen: Soft nontender bowel sound present. Musculoskeletal: No edema. Skin: No rash. Neurologic: Alert awake oriented to time place and person.  Moves all  extremities. Psychiatric: Appears normal.  Normal affect.   Labs on Admission: I have personally reviewed following labs and imaging studies  CBC: Recent Labs  Lab 01/18/24 2013  WBC 8.8  NEUTROABS 8.3*  HGB 14.1  HCT 42.6  MCV 91.8  PLT 328   Basic Metabolic Panel: Recent Labs  Lab 01/18/24 2013  NA 135  K 4.9  CL 104  CO2 17*  GLUCOSE 140*  BUN 20  CREATININE 1.10  CALCIUM  9.3   GFR: Estimated Creatinine Clearance: 58.7 mL/min (by C-G formula based on SCr of 1.1 mg/dL). Liver Function Tests: Recent Labs  Lab 01/18/24 2013  AST 13*  ALT 12  ALKPHOS 111  BILITOT 0.7  PROT 7.0  ALBUMIN 3.5   No results for input(s): LIPASE, AMYLASE in the last 168 hours. No results for input(s): AMMONIA in the last 168 hours. Coagulation Profile: Recent Labs  Lab 01/18/24 2013  INR 1.0   Cardiac Enzymes: No results for input(s): CKTOTAL, CKMB, CKMBINDEX, TROPONINI in the last 168 hours. BNP (last 3 results) No results for input(s): PROBNP in the last 8760 hours. HbA1C: No results for input(s): HGBA1C in the last 72 hours. CBG: No results for input(s): GLUCAP in the last 168 hours. Lipid Profile: No results for input(s): CHOL, HDL, LDLCALC, TRIG, CHOLHDL, LDLDIRECT in the last 72 hours. Thyroid Function Tests: No results for input(s): TSH, T4TOTAL, FREET4, T3FREE, THYROIDAB in the last 72 hours. Anemia Panel: No results for input(s): VITAMINB12, FOLATE, FERRITIN, TIBC, IRON, RETICCTPCT in the last 72 hours. Urine analysis:    Component Value Date/Time   COLORURINE YELLOW 07/15/2022 1645   APPEARANCEUR CLEAR 07/15/2022 1645   LABSPEC >1.046 (H) 07/15/2022 1645   PHURINE 6.0 07/15/2022 1645   GLUCOSEU NEGATIVE 07/15/2022 1645   HGBUR NEGATIVE 07/15/2022 1645   BILIRUBINUR NEGATIVE 07/15/2022 1645   KETONESUR NEGATIVE 07/15/2022 1645   PROTEINUR 30 (A) 07/15/2022 1645   NITRITE NEGATIVE 07/15/2022 1645    LEUKOCYTESUR NEGATIVE 07/15/2022 1645   Sepsis Labs: @LABRCNTIP (procalcitonin:4,lacticidven:4) )No results found for this or any previous visit (from the past 240 hours).   Radiological Exams on Admission: CT Angio Chest/Abd/Pel for Dissection W and/or Wo Contrast Result Date: 01/18/2024 EXAM: CTA CHEST, ABDOMEN AND PELVIS WITHOUT AND WITH CONTRAST 01/18/2024 10:37:45 PM TECHNIQUE: CTA of the chest was performed without and with the administration of 100 mL of intravenous contrast (iohexol  (OMNIPAQUE ) 350 MG/ML injection). CTA of the abdomen and pelvis was performed without and with the administration of 100 mL of intravenous contrast (iohexol  (OMNIPAQUE ) 350 MG/ML injection). Multiplanar reformatted images are provided for review. MIP images are provided for review. Automated exposure control, iterative reconstruction, and/or weight based adjustment of the mA/kV was utilized to reduce the radiation dose to as low as reasonably achievable. COMPARISON: CT 12/31/2021, CT 07/15/2022, and PET/CT 11/09/2023. 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 Right-sided back pain for 4 months. FINDINGS: VASCULATURE: AORTA: Postoperative change of thoracic and abdominal endovascular aortic repair with fenestrated graft repair of the abdominal aortic aneurysm. Thoracic Aorta: Type Ia endoleak of the thoracic aortic stent. This leak is more anterior compared to the leak seen on 12/31/2021. The diameter of the excluded aneurysm sac in the proximal descending thoracic aorta has increased in size from 12/31/2021, measuring 44 mm today, previously 42 mm. The distal descending thoracic aorta is unchanged in size, again measuring 46 mm in diameter. The stent grafts are widely patent. Patent fenestrated grafts for the  celiac axis, SMA, and bilateral renal arteries. Abdominal Aorta: Excluded aneurysm sac in the abdomen has a maximum diameter of 7.7 cm. This has increased from 07/15/2022 when it measured 7.1 cm. No evidence of endoleak. Aortobiiliac endograft  with patent bilateral common and external iliac arteries. PULMONARY ARTERIES: No pulmonary embolism with the limits of this exam. GREAT VESSELS OF AORTIC ARCH: No acute finding. No dissection. No arterial occlusion or significant stenosis. ILIAC ARTERIES: Aortobiiliac endograft with patent bilateral common and external iliac arteries. Patent right internal iliac artery stent across a excluded aneurysm . 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 CHEST: MEDIASTINUM: No mediastinal lymphadenopathy. The heart and pericardium demonstrate no acute abnormality. LUNGS AND PLEURA: Large loculated right pleural effusion. Post-radiation fibrosis and atelectasis in the right lung. Status post right lower lobectomy. No pneumothorax. THORACIC BONES AND SOFT TISSUES: No acute bone or soft tissue abnormality. ABDOMEN AND PELVIS: LIVER: Indeterminate 0.9 cm arterial hyper-enhancing lesion in the anterior right hepatic lobe (series 5, image 143). GALLBLADDER AND BILE DUCTS: Cholecystectomy. No biliary ductal dilatation. SPLEEN: The spleen is unremarkable. PANCREAS: The pancreas is unremarkable. ADRENAL GLANDS: Bilateral adrenal glands demonstrate no acute abnormality. KIDNEYS, URETERS AND BLADDER: No stones in the kidneys or ureters. No hydronephrosis. No perinephric or periureteral stranding. Urinary bladder is unremarkable. GI AND BOWEL: Stomach and duodenal sweep demonstrate no acute abnormality. There is no bowel obstruction. No abnormal bowel wall thickening or distension. REPRODUCTIVE: Enlarged prostate. PERITONEUM AND RETROPERITONEUM: No ascites or free  air. LYMPH NODES: No lymphadenopathy. ABDOMINAL BONES AND SOFT TISSUES: No acute abnormality of the bones. No acute soft tissue abnormality. IMPRESSION: 1. Type 1a endoleak of the thoracic aortic stent, more anterior than on 12/31/21, with slight interval enlargement of the excluded proximal descending thoracic aneurysm sac (44 mm, previously 42 mm). 2. Excluded abdominal aortic aneurysm now 7.7 cm (previously 7.1 cm on 07/15/22); no evidence of endoleak. 3. Large loculated right pleural effusion, increased from 11/09/23. 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 Silver vascular surgery consult planning for procedure today.  Patient will be kept n.p.o. and on pain relief medications. Low back pain with history of metastatic adenocarcinoma of the lung.  Patient likely will need MRI of the spine to further study on the back pain.  Patient presently appears nonfocal. Large right sided loculated pleural effusion likely from metastatic adenocarcinoma of the lung.  Will need thoracentesis.  Presently not hypoxic. Metastatic adenocarcinoma of the lung being followed by oncologist Dr. Maude Crease presently on Xgeva . Hypertension takes ARB.  Will keep patient on as needed IV labetalol  for now while NPO. CAD status post PCI denies any chest pain.  EKG is pending.  Plavix  on hold in anticipation of procedure.  Per cardiology notes okay to hold Plavix  for procedure.   Since patient has type Ia  endoleak of the thoracic aortic stent will need close monitoring further workup and more than 2 midnight stay.   DVT prophylaxis: SCDs. Code Status: DNR confirmed with patient. Family Communication: Discussed with patient. Disposition Plan: Progressive care. Consults called: Vascular surgery. Admission status: Inpatient.

## 2024-01-19 NOTE — ED Provider Notes (Signed)
  EMERGENCY DEPARTMENT AT [facility] Provider Note   CSN: [id number] Arrival date & time: 01/18/24  8079     Patient presents with: Back Pain   Patrick Adams is a 78 y.o. male.   The history is provided by the patient.  Back Pain Location:  Thoracic spine (right of center) Radiates to:  Does not radiate Pain severity:  Severe Pain is:  Same all the time Timing:  Constant Progression:  Unchanged Chronicity:  New Context: not MCA, not MVA and not occupational injury   Relieved by:  Nothing Worsened by:  Nothing Ineffective treatments:  None tried Associated symptoms: no abdominal pain, no chest pain, no fever, no perianal numbness, no tingling and no weakness   Risk factors: hx of cancer   Patient with aneurysm repair and metastatic lung CA presents with right sided thoracic back pain.      Past Medical History:  Diagnosis Date   CAD S/P PCI 01/2018   a) 01/31/2018: p-mLAD 60-100% (DES PCI with 2.75 mm x 38 mm Synergy DES - 4.0-3.6.3.0 mm tapered post-dilation); b) p-mRCA 55%-85%-40% - Synergy DES 3.5 mm x 32 mm -> 4.0 mm; & RPAV 90% --> Synergy DES 2.5 mm x 12 mm -> 3.0 mm;; c) stents patent on re-look Cath 03/19/2018   Diverticulitis    Erectile dysfunction    Hemochromatosis    History of radiation therapy    Right lung-02/18/23-03/06/23- Dr. Lynwood Nasuti   Hyperlipidemia    Hypertension    Ischemic cardiomyopathy 01/31/2018   Initial post STEMI EF by echo 40 to 45% --> by cath 1 month later LV gram showed EF 55 to 60%. -- most Recent Echo: EF 45-50%. Mid-Apical Anterior/Anterospetal HKc.a LAD infarct.   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 Glean Serve, annual scans -> until summer/fall 2021 -> noted progression to 6.2 mm aneurysm --> 11/25/221 [facility]- Dr. Missy): 4 V branched FEVAR (CA,SMA, R&L Renal) -> 05/09/19: Type 1a Endoleak  - TEVAR & f/u TEVAR cuff Extension (2/11)     Prior to Admission medications   Medication Sig Start Date End Date Taking? Authorizing Provider  acetaminophen  (TYLENOL ) 500 MG tablet Take 1,000 mg by mouth every 6 (six) hours as needed for moderate pain or headache.    [provider]  cholecalciferol (VITAMIN D3) 25 MCG (1000 UT) tablet Take 1,000 Units by mouth daily.    [provider]  clopidogrel  (PLAVIX ) 75 MG tablet Take 1 tablet (75 mg total) by mouth daily. 02/25/23   Jerilynn Lamarr HERO, NP  ibuprofen (ADVIL) 200 MG tablet Take 200 mg by mouth 2 (two) times daily. As needed    [provider]  lidocaine  (  LIDODERM ) 5 % Place 1 patch onto the skin daily. Remove & Discard patch within 12 hours or as directed by MD 05/12/23   Timmy Maude SAUNDERS, MD  losartan  (COZAAR ) 100 MG tablet Take 50 mg by mouth. 03/18/21   [provider]  methocarbamol (ROBAXIN) 500 MG tablet Take 500 mg by mouth 3 (three) times daily as needed. 11/16/23   [provider]  nitroGLYCERIN  (NITROSTAT ) 0.4 MG SL tablet Place 1 tablet (0.4 mg total) under the tongue every 5 (five) minutes as needed for chest pain. 06/02/23 06/01/24  Anner Alm ORN, MD  pantoprazole  (PROTONIX ) 40 MG tablet Take 40 mg by mouth daily.    [provider]  polyethylene glycol (MIRALAX  / GLYCOLAX ) 17 g packet Take 17 g by mouth daily as needed (Constipation). 05/24/19   Frann Mabel Mt, DO  pregabalin  (LYRICA ) 100 MG capsule Take 1 capsule (100 mg total) by mouth 2 (two) times daily. 10/19/23   Timmy Maude SAUNDERS, MD  sildenafil  (VIAGRA ) 100 MG tablet Take 1 tablet (100 mg total) by mouth daily as needed for erectile dysfunction. 09/10/20   Anner Alm ORN, MD  sildenafil  (VIAGRA ) 100 MG tablet Take 1 tablet (100 mg total) by mouth daily as needed for erectile dysfunction. 11/23/23   Timmy Maude SAUNDERS, MD  simethicone  (MYLICON) 80 MG chewable tablet Chew 80 mg by mouth as needed for flatulence.    [provider]    Allergies: Brilinta  [ticagrelor ], Ciprofloxacin, and Statins    Review of Systems  Constitutional:  Negative for fever.  Respiratory:  Negative for shortness of breath.   Cardiovascular:  Negative for chest pain.  Gastrointestinal:  Negative for abdominal pain.  Musculoskeletal:  Positive for back pain.  Neurological:  Negative for tingling and weakness.  All other systems reviewed and are negative.   Updated Vital Signs BP (!) 145/83 (BP Location: Left Arm)   Pulse 67   Temp 98 F (36.7 C)   Resp 16   Ht 6' (1.829 m)   Wt 72.6 kg   SpO2 95%   BMI 21.70 kg/m   Physical Exam Vitals and nursing note reviewed.  Constitutional:      General: He is not in acute distress.    Appearance: Normal appearance. He is well-developed. He is not diaphoretic.  HENT:     Head: Normocephalic and atraumatic.     Nose: Nose normal.  Eyes:     Conjunctiva/sclera: Conjunctivae normal.     Pupils: Pupils are equal, round, and reactive to light.  Cardiovascular:     Rate and Rhythm: Normal rate and regular rhythm.     Pulses: Normal pulses.     Heart sounds: Normal heart sounds.  Pulmonary:     Effort: Pulmonary effort is normal.     Breath sounds: Normal breath sounds. No wheezing or rales.  Abdominal:      General: Bowel sounds are normal.     Palpations: Abdomen is soft.     Tenderness: There is no abdominal tenderness. There is no guarding or rebound.  Musculoskeletal:        General: Normal range of motion.     Cervical back: Normal range of motion and neck supple.  Skin:    General: Skin is warm and dry.     Capillary Refill: Capillary refill takes less than 2 seconds.  Neurological:     General: No focal deficit present.     Mental Status: He is alert and oriented to person,  place, and time.     Deep Tendon Reflexes: Reflexes normal.  Psychiatric:        Mood and Affect: Mood normal.     (all labs ordered are listed, but only abnormal results are displayed) Results for orders placed or performed during the hospital encounter of 01/18/24  CBC with Differential   Collection Time: 01/18/24  8:13 PM  Result Value Ref Range   WBC 8.8 4.0 - 10.5 K/uL   RBC 4.64 4.22 - 5.81 MIL/uL   Hemoglobin 14.1 13.0 - 17.0 g/dL   HCT 57.3 60.9 - 47.9 %   MCV 91.8 80.0 - 100.0 fL   MCH 30.4 26.0 - 34.0 pg   MCHC 33.1 30.0 - 36.0 g/dL   RDW 86.9 88.4 - 84.4 %   Platelets 328 150 - 400 K/uL   nRBC 0.0 0.0 - 0.2 %   Neutrophils Relative % 94 %   Neutro Abs 8.3 (H) 1.7 - 7.7 K/uL   Lymphocytes Relative 4 %   Lymphs Abs 0.3 (L) 0.7 - 4.0 K/uL   Monocytes Relative 1 %   Monocytes Absolute 0.1 0.1 - 1.0 K/uL   Eosinophils Relative 1 %   Eosinophils Absolute 0.0 0.0 - 0.5 K/uL   Basophils Relative 0 %   Basophils Absolute 0.0 0.0 - 0.1 K/uL   Immature Granulocytes 0 %   Abs Immature Granulocytes 0.03 0.00 - 0.07 K/uL  Comprehensive metabolic panel   Collection Time: 01/18/24  8:13 PM  Result Value Ref Range   Sodium 135 135 - 145 mmol/L   Potassium 4.9 3.5 - 5.1 mmol/L   Chloride 104 98 - 111 mmol/L   CO2 17 (L) 22 - 32 mmol/L   Glucose, Bld 140 (H) 70 - 99 mg/dL   BUN 20 8 - 23 mg/dL   Creatinine, Ser 8.89 0.61 - 1.24 mg/dL   Calcium  9.3 8.9 - 10.3 mg/dL   Total Protein 7.0 6.5 -  8.1 g/dL   Albumin 3.5 3.5 - 5.0 g/dL   AST 13 (L) 15 - 41 U/L   ALT 12 0 - 44 U/L   Alkaline Phosphatase 111 38 - 126 U/L   Total Bilirubin 0.7 0.0 - 1.2 mg/dL   GFR, Estimated >39 >39 mL/min   Anion gap 14 5 - 15  Protime-INR   Collection Time: 01/18/24  8:13 PM  Result Value Ref Range   Prothrombin Time 13.7 11.4 - 15.2 seconds   INR 1.0 0.8 - 1.2  APTT   Collection Time: 01/18/24  8:13 PM  Result Value Ref Range   aPTT 38 (H) 24 - 36 seconds  Troponin I (High Sensitivity)   Collection Time: 01/19/24 12:12 AM  Result Value Ref Range   Troponin I (High Sensitivity) 4 <18 ng/L  Type and screen Ordered by PROVIDER DEFAULT   Collection Time: 01/19/24 12:43 AM  Result Value Ref Range   ABO/RH(D) O POS    Antibody Screen NEG    Sample Expiration      01/22/2024,2359 Performed at [facility], [address], [place], [place] [address]    CT Angio Chest/Abd/Pel for Dissection W and/or Wo Contrast Result Date: 01/18/2024 EXAM: CTA CHEST, ABDOMEN AND PELVIS WITHOUT AND WITH CONTRAST 01/18/2024 10:37:45 PM TECHNIQUE: CTA of the chest was performed without and with the administration of 100 mL of intravenous contrast (iohexol  (OMNIPAQUE ) 350 MG/ML injection). CTA of the abdomen and pelvis was performed without and with the administration of 100 mL  of intravenous contrast (iohexol  (OMNIPAQUE ) 350 MG/ML injection). Multiplanar reformatted images are provided for review. MIP images are provided for review. Automated exposure control, iterative reconstruction, and/or weight based adjustment of the mA/kV was utilized to reduce the radiation dose to as low as reasonably achievable. COMPARISON: CT 12/31/2021, CT 07/15/2022, and PET/CT 11/09/2023. 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 Right-sided back pain for 4 months. FINDINGS: VASCULATURE: AORTA: Postoperative change of thoracic and abdominal endovascular aortic repair with fenestrated graft repair of the abdominal aortic aneurysm. Thoracic Aorta: Type Ia endoleak of the thoracic aortic stent. This leak is more anterior compared to the leak seen on 12/31/2021. The diameter of the excluded aneurysm sac in the proximal descending thoracic aorta has increased in size from 12/31/2021, measuring 44 mm today, previously 42 mm. The distal descending thoracic aorta is unchanged in size, again measuring 46 mm in diameter. The stent grafts are widely patent. Patent fenestrated grafts for the celiac axis, SMA, and bilateral renal arteries. Abdominal Aorta: Excluded aneurysm sac in the abdomen has a maximum diameter of 7.7 cm. This has increased from 07/15/2022 when it measured 7.1 cm. No evidence of endoleak. Aortobiiliac endograft with patent bilateral common and external iliac arteries. PULMONARY ARTERIES: No pulmonary embolism with the limits of this exam. GREAT VESSELS OF AORTIC ARCH: No acute finding. No dissection. No arterial occlusion or significant stenosis. ILIAC ARTERIES: Aortobiiliac endograft with patent bilateral common and external iliac arteries. Patent right internal iliac artery stent across a excluded aneurysm . 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 CHEST: MEDIASTINUM: No mediastinal lymphadenopathy. The heart and pericardium demonstrate no acute abnormality. LUNGS AND PLEURA: Large loculated right pleural effusion. Post-radiation fibrosis and atelectasis in the right lung. Status post right lower lobectomy. No pneumothorax. THORACIC BONES AND SOFT TISSUES: No acute bone or soft tissue abnormality. ABDOMEN AND PELVIS: LIVER: Indeterminate 0.9 cm arterial hyper-enhancing lesion in the  anterior right hepatic lobe (series 5, image 143). GALLBLADDER AND BILE DUCTS: Cholecystectomy. No biliary ductal dilatation. SPLEEN: The spleen is unremarkable. PANCREAS: The pancreas is unremarkable. ADRENAL GLANDS: Bilateral adrenal glands demonstrate no acute abnormality. KIDNEYS, URETERS AND BLADDER: No stones in the kidneys or ureters. No hydronephrosis. No perinephric or periureteral stranding. Urinary bladder is unremarkable. GI AND BOWEL: Stomach and duodenal sweep demonstrate no acute abnormality. There is no bowel obstruction. No abnormal bowel wall thickening or distension. REPRODUCTIVE: Enlarged prostate. PERITONEUM AND RETROPERITONEUM: No ascites or free air. LYMPH NODES: No lymphadenopathy. ABDOMINAL BONES AND SOFT TISSUES: No acute abnormality of the bones. No acute soft tissue abnormality. IMPRESSION: 1. Type 1a endoleak of the thoracic aortic stent, more anterior than on 12/31/21, with slight interval enlargement of the excluded proximal descending thoracic aneurysm sac (44 mm, previously 42 mm). 2. Excluded abdominal aortic aneurysm now 7.7 cm (previously 7.1 cm on 07/15/22); no evidence of endoleak. 3. Large loculated right pleural effusion, increased from 11/09/23. Electronically signed by: Norman Gatlin MD 01/18/2024 11:10 PM EDT RP Workstation: [id number]     Radiology: CT Angio Chest/Abd/Pel for Dissection W and/or Wo Contrast Result Date: 01/18/2024 EXAM: CTA CHEST, ABDOMEN AND PELVIS WITHOUT AND WITH CONTRAST 01/18/2024 10:37:45 PM TECHNIQUE: CTA of the chest was performed without and with the administration  of 100 mL of intravenous contrast (iohexol  (OMNIPAQUE ) 350 MG/ML injection). CTA of the abdomen and pelvis was performed without and with the administration of 100 mL of intravenous contrast (iohexol  (OMNIPAQUE ) 350 MG/ML injection). Multiplanar reformatted images are provided for review. MIP images are provided for review. Automated exposure control, iterative reconstruction,  and/or weight based adjustment of the mA/kV was utilized to reduce the radiation dose to as low as reasonably achievable. COMPARISON: CT 12/31/2021, CT 07/15/2022, and PET/CT 11/09/2023. 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 Right-sided back pain for 4 months. FINDINGS: VASCULATURE: AORTA: Postoperative change of thoracic and abdominal endovascular aortic repair with fenestrated graft repair of the abdominal aortic aneurysm. Thoracic Aorta: Type Ia endoleak of the thoracic aortic stent. This leak is more anterior compared to the leak seen on 12/31/2021. The diameter of the excluded aneurysm sac in the proximal descending thoracic aorta has increased in size from 12/31/2021, measuring 44 mm today, previously 42 mm. The distal descending thoracic aorta is unchanged in size, again measuring 46 mm in diameter. The stent grafts are widely patent. Patent fenestrated grafts for the celiac axis, SMA, and bilateral renal arteries. Abdominal Aorta: Excluded aneurysm sac in the abdomen has a maximum diameter of 7.7 cm. This has increased from 07/15/2022 when it measured 7.1 cm. No evidence of endoleak. Aortobiiliac endograft with patent bilateral common and external iliac arteries. PULMONARY ARTERIES: No pulmonary embolism with the limits of this exam. GREAT VESSELS OF AORTIC ARCH: No acute finding. No dissection. No arterial occlusion or significant stenosis. ILIAC ARTERIES: Aortobiiliac endograft with patent bilateral common and external iliac arteries. Patent right internal iliac artery stent across a excluded aneurysm . 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 CHEST:  MEDIASTINUM: No mediastinal lymphadenopathy. The heart and pericardium demonstrate no acute abnormality. LUNGS AND PLEURA: Large loculated right pleural effusion. Post-radiation fibrosis and atelectasis in the right lung. Status post right lower lobectomy. No pneumothorax. THORACIC BONES AND SOFT TISSUES: No acute bone or soft tissue abnormality. ABDOMEN AND PELVIS: LIVER: Indeterminate 0.9 cm arterial hyper-enhancing lesion in the anterior right hepatic lobe (series 5, image 143). GALLBLADDER AND BILE DUCTS: Cholecystectomy. No biliary ductal dilatation. SPLEEN: The spleen is unremarkable. PANCREAS: The pancreas is unremarkable. ADRENAL GLANDS: Bilateral adrenal glands demonstrate no acute abnormality. KIDNEYS, URETERS AND BLADDER: No stones in the kidneys or ureters. No hydronephrosis. No perinephric or periureteral stranding. Urinary bladder is unremarkable. GI AND BOWEL: Stomach and duodenal sweep demonstrate no acute abnormality. There is no bowel obstruction. No abnormal bowel wall thickening or distension. REPRODUCTIVE: Enlarged prostate. PERITONEUM AND RETROPERITONEUM: No ascites or free air. LYMPH NODES: No lymphadenopathy. ABDOMINAL BONES AND SOFT TISSUES: No acute abnormality of the bones. No acute soft tissue abnormality. IMPRESSION: 1. Type 1a endoleak of the thoracic aortic stent, more anterior than on 12/31/21, with slight interval enlargement of the excluded proximal descending thoracic aneurysm sac (44 mm, previously 42 mm). 2. Excluded abdominal aortic aneurysm now 7.7 cm (previously 7.1 cm on 07/15/22); no evidence of endoleak. 3. Large loculated right pleural effusion, increased from 11/09/23. Electronically signed by: Norman Gatlin MD 01/18/2024 11:10 PM EDT RP Workstation: [id number]     .Critical Care  Performed by: Nettie Earing, MD Authorized by: Brena Windsor,  Criag Wicklund, MD   Critical care provider statement:    Critical care time (minutes):  30   Critical care end time:  01/19/2024 2:15  AM   Critical care time was exclusive of:  Separately billable procedures and treating other patients   Critical care was necessary to treat or prevent imminent or life-threatening deterioration of the following conditions:  Cardiac failure   Critical care was time spent personally by me on the following activities:  Development of treatment plan with patient or surrogate, discussions with consultants, evaluation of patient's response to treatment, examination of patient, ordering and review of laboratory studies, ordering and review of radiographic studies, ordering and performing treatments and interventions, pulse oximetry, re-evaluation of patient's condition and review of old charts   I assumed direction of critical care for this patient from another provider in my specialty: no     Care discussed with: admitting provider   Comments:     Consult to thoracic surgery Discussion with pharmacy  Discussion with critical care Dr. Layman Admission to medicine     Medications Ordered in the ED  fentaNYL  (SUBLIMAZE ) injection 50 mcg (has no administration in time range)  oxyCODONE-acetaminophen  (PERCOCET/ROXICET) 5-325 MG per tablet 1 tablet (1 tablet Oral Given 01/18/24 2023)  iohexol  (OMNIPAQUE ) 350 MG/ML injection 100 mL (100 mLs Intravenous Contrast Given 01/18/24 2238)  fentaNYL  (SUBLIMAZE ) injection 50 mcg (50 mcg Intravenous Given 01/19/24 0018)  ondansetron  (ZOFRAN ) injection 4 mg (4 mg Intravenous Given 01/19/24 0018)                                    Medical Decision Making Patient with previous aneurysm repair and known metastatic lung CA with thoracic back pain   Amount and/or Complexity of Data Reviewed External Data Reviewed: notes.    Details: Previous notes reviewed through care everywhere  Labs: ordered.    Details: Normal white count 8.8, normal hemoglobin 14.1, normal platelets.  Normal sodium 135, normal potassium 4.9, normal creatinine 1.1  Radiology: ordered and  independent interpretation performed.    Details: Leak of graft on CT Discussion of management or test interpretation with external provider(s): Case d/w With Dr. Grayce of vascular who will take patient to the OR in am, please admit to medicine   Risk Prescription drug management. Decision regarding hospitalization.     Final diagnoses:  Leaking thoracic aortic aneurysm (TAA) (HCC)  Pleural effusion  Primary malignant neoplasm of lung metastatic to other site, unspecified laterality [facility])   The patient appears reasonably stabilized for admission considering the current resources, flow, and capabilities available in the ED at this time, and I doubt any other [facility] requiring further screening and/or treatment in the ED prior to admission.  ED Discharge Orders     None          Mikhala Kenan, MD 01/19/24 [phone]

## 2024-02-01 ENCOUNTER — Inpatient Hospital Stay: Attending: Hematology & Oncology

## 2024-02-01 ENCOUNTER — Encounter: Payer: Self-pay | Admitting: Hematology & Oncology

## 2024-02-01 ENCOUNTER — Inpatient Hospital Stay

## 2024-02-01 ENCOUNTER — Inpatient Hospital Stay: Admitting: Hematology & Oncology

## 2024-02-01 ENCOUNTER — Other Ambulatory Visit (HOSPITAL_BASED_OUTPATIENT_CLINIC_OR_DEPARTMENT_OTHER): Payer: Self-pay

## 2024-02-01 ENCOUNTER — Other Ambulatory Visit: Payer: Self-pay

## 2024-02-01 VITALS — BP 141/80 | HR 73 | Temp 97.9°F | Resp 18 | Ht 72.0 in | Wt 158.0 lb

## 2024-02-01 DIAGNOSIS — R079 Chest pain, unspecified: Secondary | ICD-10-CM | POA: Diagnosis not present

## 2024-02-01 DIAGNOSIS — C3490 Malignant neoplasm of unspecified part of unspecified bronchus or lung: Secondary | ICD-10-CM

## 2024-02-01 DIAGNOSIS — Z79899 Other long term (current) drug therapy: Secondary | ICD-10-CM | POA: Insufficient documentation

## 2024-02-01 DIAGNOSIS — N5201 Erectile dysfunction due to arterial insufficiency: Secondary | ICD-10-CM

## 2024-02-01 DIAGNOSIS — C7951 Secondary malignant neoplasm of bone: Secondary | ICD-10-CM

## 2024-02-01 DIAGNOSIS — F17210 Nicotine dependence, cigarettes, uncomplicated: Secondary | ICD-10-CM | POA: Insufficient documentation

## 2024-02-01 DIAGNOSIS — J90 Pleural effusion, not elsewhere classified: Secondary | ICD-10-CM | POA: Insufficient documentation

## 2024-02-01 LAB — CBC WITH DIFFERENTIAL (CANCER CENTER ONLY)
Abs Immature Granulocytes: 0.04 K/uL (ref 0.00–0.07)
Basophils Absolute: 0 K/uL (ref 0.0–0.1)
Basophils Relative: 0 %
Eosinophils Absolute: 0.7 K/uL — ABNORMAL HIGH (ref 0.0–0.5)
Eosinophils Relative: 8 %
HCT: 39.5 % (ref 39.0–52.0)
Hemoglobin: 13.2 g/dL (ref 13.0–17.0)
Immature Granulocytes: 0 %
Lymphocytes Relative: 7 %
Lymphs Abs: 0.6 K/uL — ABNORMAL LOW (ref 0.7–4.0)
MCH: 30.3 pg (ref 26.0–34.0)
MCHC: 33.4 g/dL (ref 30.0–36.0)
MCV: 90.6 fL (ref 80.0–100.0)
Monocytes Absolute: 0.5 K/uL (ref 0.1–1.0)
Monocytes Relative: 5 %
Neutro Abs: 7.1 K/uL (ref 1.7–7.7)
Neutrophils Relative %: 80 %
Platelet Count: 356 K/uL (ref 150–400)
RBC: 4.36 MIL/uL (ref 4.22–5.81)
RDW: 13.1 % (ref 11.5–15.5)
WBC Count: 8.9 K/uL (ref 4.0–10.5)
nRBC: 0 % (ref 0.0–0.2)

## 2024-02-01 LAB — CMP (CANCER CENTER ONLY)
ALT: 6 U/L (ref 0–44)
AST: 13 U/L — ABNORMAL LOW (ref 15–41)
Albumin: 4 g/dL (ref 3.5–5.0)
Alkaline Phosphatase: 123 U/L (ref 38–126)
Anion gap: 11 (ref 5–15)
BUN: 18 mg/dL (ref 8–23)
CO2: 22 mmol/L (ref 22–32)
Calcium: 9.5 mg/dL (ref 8.9–10.3)
Chloride: 104 mmol/L (ref 98–111)
Creatinine: 1.1 mg/dL (ref 0.61–1.24)
GFR, Estimated: >60 mL/min (ref >60)
Glucose, Bld: 82 mg/dL (ref 70–99)
Potassium: 4.4 mmol/L (ref 3.5–5.1)
Sodium: 137 mmol/L (ref 135–145)
Total Bilirubin: 0.3 mg/dL (ref 0.0–1.2)
Total Protein: 6.9 g/dL (ref 6.5–8.1)

## 2024-02-01 LAB — IRON AND IRON BINDING CAPACITY (CC-WL,HP ONLY)
Iron: 47 ug/dL (ref 45–182)
Saturation Ratios: 21 % (ref 17.9–39.5)
TIBC: 230 ug/dL — ABNORMAL LOW (ref 250–450)
UIBC: 183 ug/dL

## 2024-02-01 LAB — SAVE SMEAR(SSMR), FOR PROVIDER SLIDE REVIEW

## 2024-02-01 LAB — SAMPLE TO BLOOD BANK

## 2024-02-01 LAB — FERRITIN: Ferritin: 217 ng/mL (ref 24–336)

## 2024-02-01 MED ORDER — PREGABALIN 100 MG PO CAPS
100.0000 mg | ORAL_CAPSULE | Freq: Three times a day (TID) | ORAL | 2 refills | Status: DC
  Filled 2024-02-01 – 2024-02-03 (×2): qty 90, 30d supply, fill #0

## 2024-02-01 MED ORDER — DENOSUMAB 120 MG/1.7ML ~~LOC~~ SOLN: 120.0000 mg | Freq: Once | SUBCUTANEOUS | Status: DC

## 2024-02-01 NOTE — Progress Notes (Signed)
 Hematology and Oncology Follow Up Visit  Patrick Adams [id number] 1945-12-22 78 y.o. 02/01/2024   Principle Diagnosis:  Metastatic non-small cell lung cancer-adenocarcinoma. --- NO actionable mutations  Current Therapy:   Xgeva  120 mg subcu every 3 months-next dose in 05/2024   XRT to the right upper lobe nodule -definitive treatment on 03/09/2023     Interim History:  Patrick Adams is back for a follow-up.  Unfortunately, he now is having problems over on the right side.  He is having severe pain over the right rib cage.  This seems to be where he had his thoracotomy.  Again, I am not sure what can really be done about this.  I think that he went to the ER I think 10 days or so ago.  He had a CT scan that was done.  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  He was about to have surgery for but the surgeon stopped the surgery because it was known that he had had this aneurysm before and the vascular surgeons at [facility] were not worried about it.  Again, he is really debilitated by this pain.  I am does not sure what we can do about this.  He is on Lyrica .  We can try to increase the Lyrica  dose.  I looked at the CT scan that he had done.  There is no evidence of cancer.  However he has a large loculated pleural effusion.  I am not sure if this might be causing some of the pain.  I will have to see if we can get this effusion drained if possible.  Again, I do not see any evidence of malignancy.  He says Tylenol  and Advil seem to help the pain but only temporarily.  He does have a Lidoderm  patch but cannot reach the area that the pain is in.  He has had no nausea or vomiting.  He still smokes.  He is trying to cut back.  There is been no change in bowel or bladder habits.  He has had no bleeding.  He is not coughing up anything.  For right now, I would say his performance status is probably ECOG 2.   Medications:  Current Outpatient Medications:    acetaminophen  (TYLENOL )  500 MG tablet, Take 1,000 mg by mouth every 6 (six) hours as needed for moderate pain or headache., Disp: , Rfl:    cholecalciferol (VITAMIN D3) 25 MCG (1000 UT) tablet, Take 1,000 Units by mouth daily., Disp: , Rfl:    clopidogrel  (PLAVIX ) 75 MG tablet, Take 1 tablet (75 mg total) by mouth daily., Disp: 90 tablet, Rfl: 3   ibuprofen (ADVIL) 200 MG tablet, Take 200 mg by mouth every 4 (four) hours. As needed, Disp: , Rfl:    lidocaine  (LIDODERM ) 5 %, Place 1 patch onto the skin daily. Remove & Discard patch within 12 hours or as directed by MD, Disp: 30 patch, Rfl: 4   losartan  (COZAAR ) 100 MG tablet, Take 50 mg by mouth at bedtime., Disp: , Rfl:    nitroGLYCERIN  (NITROSTAT ) 0.4 MG SL tablet, Place 1 tablet (0.4 mg total) under the tongue every 5 (five) minutes as needed for chest pain. (Patient not taking: Reported on 01/19/2024), Disp: 25 tablet, Rfl: 4   polyethylene glycol (MIRALAX  / GLYCOLAX ) 17 g packet, Take 17 g by mouth daily as needed (Constipation)., Disp: 14 each, Rfl: 0   pregabalin  (LYRICA ) 100 MG capsule, Take 1 capsule (100 mg total) by mouth  2 (two) times daily., Disp: 60 capsule, Rfl: 2   sildenafil  (VIAGRA ) 100 MG tablet, Take 1 tablet (100 mg total) by mouth daily as needed for erectile dysfunction., Disp: 10 tablet, Rfl: 6   sildenafil  (VIAGRA ) 100 MG tablet, Take 1 tablet (100 mg total) by mouth daily as needed for erectile dysfunction. (Patient not taking: Reported on 01/19/2024), Disp: 10 tablet, Rfl: 3   simethicone  (MYLICON) 80 MG chewable tablet, Chew 80 mg by mouth as needed for flatulence., Disp: , Rfl:  No current facility-administered medications for this visit.  Facility-Administered Medications Ordered in Other Visits:    0.9 %  sodium chloride  infusion, , Intra-arterial, Continuous PRN, Harrold Macintosh, CRNA, Last Rate: 5 mL/hr at 01/19/24 0635, New Bag at 01/19/24 9364   lactated ringers  infusion, , Intravenous, Continuous PRN, Harrold Macintosh, CRNA, New Bag at  01/19/24 0710  Allergies:  Allergies  Allergen Reactions   Brilinta  [Ticagrelor ] Shortness Of Breath     Patient was taking 60 mg  Twice a day - changed to Clopidogrel  75 mg  03/02/19   Ciprofloxacin Anaphylaxis   Alirocumab  Other (See Comments)    Fatigue, diarrhea **Praluent **   Statins Other (See Comments)    Joint pain.Myalgias -- HAS TRIED LIPITOR, ZOCOR & CRESTOR     Past Medical History, Surgical history, Social history, and Family History were reviewed and updated.  Review of Systems: Review of Systems  Constitutional:  Positive for unexpected weight change.  HENT:  Negative.    Eyes: Negative.   Respiratory: Negative.    Cardiovascular:  Positive for chest pain.  Gastrointestinal: Negative.   Endocrine: Negative.   Genitourinary: Negative.    Musculoskeletal: Negative.   Skin: Negative.   Neurological: Negative.   Hematological: Negative.   Psychiatric/Behavioral: Negative.      Physical Exam: Vital signs show temperature 97.9.  Pulse 73.  Blood pressure 141/80.  Weight is 158 pounds.     Wt Readings from Last 3 Encounters:  01/19/24 160 lb (72.6 kg)  11/23/23 161 lb (73 kg)  09/08/23 156 lb (70.8 kg)    Physical Exam Vitals reviewed.  HENT:     Head: Normocephalic and atraumatic.  Eyes:     Pupils: Pupils are equal, round, and reactive to light.  Cardiovascular:     Rate and Rhythm: Normal rate and regular rhythm.     Heart sounds: Normal heart sounds.  Pulmonary:     Effort: Pulmonary effort is normal.     Breath sounds: Normal breath sounds.  Abdominal:     General: Bowel sounds are normal.     Palpations: Abdomen is soft.  Musculoskeletal:        General: No tenderness or deformity. Normal range of motion.     Cervical back: Normal range of motion.     Comments: He does have limited range of motion of the right shoulder.  There is some weakness in the right arm.    Lymphadenopathy:     Cervical: No cervical adenopathy.  Skin:    General:  Skin is warm and dry.     Findings: No erythema or rash.  Neurological:     Mental Status: He is alert and oriented to person, place, and time.  Psychiatric:        Behavior: Behavior normal.        Thought Content: Thought content normal.        Judgment: Judgment normal.      Lab Results  Component Value Date  WBC 8.9 02/01/2024   HGB 13.2 02/01/2024   HCT 39.5 02/01/2024   MCV 90.6 02/01/2024   PLT 356 02/01/2024     Chemistry      Component Value Date/Time   NA 134 (L) 01/19/2024 0947   NA 138 04/11/2021 1130   K 4.4 01/19/2024 0947   CL 105 01/19/2024 0947   CO2 20 (L) 01/19/2024 0947   BUN 20 01/19/2024 0947   BUN 20 04/11/2021 1130   CREATININE 1.08 01/19/2024 0947   CREATININE 1.21 11/23/2023 0853      Component Value Date/Time   CALCIUM  8.9 01/19/2024 0947   ALKPHOS 97 01/19/2024 0947   AST 11 (L) 01/19/2024 0947   AST 14 (L) 11/23/2023 0853   ALT 11 01/19/2024 0947   ALT 7 11/23/2023 0853   BILITOT 0.7 01/19/2024 0947   BILITOT 0.4 11/23/2023 0853       Impression and Plan: Patrick Adams is a very nice 78 year old white male.  He has a history of adenocarcinoma of the lung.  He seemed to have an area of disease on MRI that was in the right eighth and 10th rib.  I think he had radiation for this as completed in February.  Again, I am not sure exactly why is having all this pain.  It certainly sounds like it might be some kind of nerve pain.  I find it no questions that this is where he had a thoracotomy incision.  I am not sure what can be done for this.  He said that he is seeing orthopedic surgery.  He said that they did do injections which did not help.  We will see if we cannot get IR to do a thoracentesis.  I know that he will get his Xgeva  today.  Again I feel bad is having all this pain.  Maybe, having the fluid taken out might help if that can be done.  Unfortunately, we will have to get him back sooner to see him.  He is having a lot of  problems.  I just do not want to see him be miserable.   Maude JONELLE Crease, MD 11/3/20253:24 PM

## 2024-02-01 NOTE — Patient Instructions (Signed)
 Denosumab  Injection (Oncology) What is this medication? DENOSUMAB  (den oh SUE mab) prevents weakened bones caused by cancer. It may also be used to treat noncancerous bone tumors that cannot be removed by surgery. It can also be used to treat high calcium  levels in the blood caused by cancer. It works by blocking a protein that causes bones to break down quickly. This slows down the release of calcium  from bones, which lowers calcium  levels in your blood. It also makes your bones stronger and less likely to break (fracture). This medicine may be used for other purposes; ask your health care provider or pharmacist if you have questions. COMMON BRAND NAME(S): XGEVA  What should I tell my care team before I take this medication? They need to know if you have any of these conditions: Dental disease Having surgery or tooth extraction Infection Kidney disease Low levels of calcium  or vitamin D  in the blood Malnutrition On hemodialysis Skin conditions or sensitivity Thyroid  or parathyroid disease An unusual reaction to denosumab , other medications, foods, dyes, or preservatives Pregnant or trying to get pregnant Breast-feeding How should I use this medication? This medication is for injection under the skin. It is given by your care team in a hospital or clinic setting. A special MedGuide will be given to you before each treatment. Be sure to read this information carefully each time. Talk to your care team about the use of this medication in children. While it may be prescribed for children as young as 13 years for selected conditions, precautions do apply. Overdosage: If you think you have taken too much of this medicine contact a poison control center or emergency room at once. NOTE: This medicine is only for you. Do not share this medicine with others. What if I miss a dose? Keep appointments for follow-up doses. It is important not to miss your dose. Call your care team if you are unable to  keep an appointment. What may interact with this medication? Do not take this medication with any of the following: Other medications containing denosumab  This medication may also interact with the following: Medications that lower your chance of fighting infection Steroid medications, such as prednisone  or cortisone This list may not describe all possible interactions. Give your health care provider a list of all the medicines, herbs, non-prescription drugs, or dietary supplements you use. Also tell them if you smoke, drink alcohol, or use illegal drugs. Some items may interact with your medicine. What should I watch for while using this medication? Your condition will be monitored carefully while you are receiving this medication. You may need blood work while taking this medication. This medication may increase your risk of getting an infection. Call your care team for advice if you get a fever, chills, sore throat, or other symptoms of a cold or flu. Do not treat yourself. Try to avoid being around people who are sick. You should make sure you get enough calcium  and vitamin D  while you are taking this medication, unless your care team tells you not to. Discuss the foods you eat and the vitamins you take with your care team. Some people who take this medication have severe bone, joint, or muscle pain. This medication may also increase your risk for jaw problems or a broken thigh bone. Tell your care team right away if you have severe pain in your jaw, bones, joints, or muscles. Tell your care team if you have any pain that does not go away or that gets worse. Talk  to your care team if you may be pregnant. Serious birth defects can occur if you take this medication during pregnancy and for 5 months after the last dose. You will need a negative pregnancy test before starting this medication. Contraception is recommended while taking this medication and for 5 months after the last dose. Your care team  can help you find the option that works for you. What side effects may I notice from receiving this medication? Side effects that you should report to your care team as soon as possible: Allergic reactions--skin rash, itching, hives, swelling of the face, lips, tongue, or throat Bone, joint, or muscle pain Low calcium  level--muscle pain or cramps, confusion, tingling, or numbness in the hands or feet Osteonecrosis of the jaw--pain, swelling, or redness in the mouth, numbness of the jaw, poor healing after dental work, unusual discharge from the mouth, visible bones in the mouth Side effects that usually do not require medical attention (report to your care team if they continue or are bothersome): Cough Diarrhea Fatigue Headache Nausea This list may not describe all possible side effects. Call your doctor for medical advice about side effects. You may report side effects to FDA at 1-800-FDA-1088. Where should I keep my medication? This medication is given in a hospital or clinic. It will not be stored at home. NOTE: This sheet is a summary. It may not cover all possible information. If you have questions about this medicine, talk to your doctor, pharmacist, or health care provider.  2024 Elsevier/Gold Standard (2021-08-07 00:00:00)

## 2024-02-02 ENCOUNTER — Other Ambulatory Visit (HOSPITAL_BASED_OUTPATIENT_CLINIC_OR_DEPARTMENT_OTHER): Payer: Self-pay

## 2024-02-02 ENCOUNTER — Ambulatory Visit: Payer: Self-pay | Admitting: Hematology & Oncology

## 2024-02-02 ENCOUNTER — Inpatient Hospital Stay

## 2024-02-02 VITALS — BP 108/55 | HR 86 | Temp 97.7°F | Resp 18

## 2024-02-02 DIAGNOSIS — C7951 Secondary malignant neoplasm of bone: Secondary | ICD-10-CM | POA: Diagnosis not present

## 2024-02-02 LAB — PSA, TOTAL AND FREE
PSA, Free Pct: 47.2 %
PSA, Free: 2.69 ng/mL
Prostate Specific Ag, Serum: 5.7 ng/mL — ABNORMAL HIGH (ref 0.0–4.0)

## 2024-02-02 MED ORDER — DENOSUMAB 120 MG/1.7ML ~~LOC~~ SOLN
120.0000 mg | Freq: Once | SUBCUTANEOUS | Status: AC
  Administered 2024-02-02: 120 mg via SUBCUTANEOUS
  Filled 2024-02-02: qty 1.7

## 2024-02-02 NOTE — Patient Instructions (Signed)
 Denosumab  Injection (Oncology) What is this medication? DENOSUMAB  (den oh SUE mab) prevents weakened bones caused by cancer. It may also be used to treat noncancerous bone tumors that cannot be removed by surgery. It can also be used to treat high calcium  levels in the blood caused by cancer. It works by blocking a protein that causes bones to break down quickly. This slows down the release of calcium  from bones, which lowers calcium  levels in your blood. It also makes your bones stronger and less likely to break (fracture). This medicine may be used for other purposes; ask your health care provider or pharmacist if you have questions. COMMON BRAND NAME(S): XGEVA  What should I tell my care team before I take this medication? They need to know if you have any of these conditions: Dental disease Having surgery or tooth extraction Infection Kidney disease Low levels of calcium  or vitamin D  in the blood Malnutrition On hemodialysis Skin conditions or sensitivity Thyroid  or parathyroid disease An unusual reaction to denosumab , other medications, foods, dyes, or preservatives Pregnant or trying to get pregnant Breast-feeding How should I use this medication? This medication is for injection under the skin. It is given by your care team in a hospital or clinic setting. A special MedGuide will be given to you before each treatment. Be sure to read this information carefully each time. Talk to your care team about the use of this medication in children. While it may be prescribed for children as young as 13 years for selected conditions, precautions do apply. Overdosage: If you think you have taken too much of this medicine contact a poison control center or emergency room at once. NOTE: This medicine is only for you. Do not share this medicine with others. What if I miss a dose? Keep appointments for follow-up doses. It is important not to miss your dose. Call your care team if you are unable to  keep an appointment. What may interact with this medication? Do not take this medication with any of the following: Other medications containing denosumab  This medication may also interact with the following: Medications that lower your chance of fighting infection Steroid medications, such as prednisone  or cortisone This list may not describe all possible interactions. Give your health care provider a list of all the medicines, herbs, non-prescription drugs, or dietary supplements you use. Also tell them if you smoke, drink alcohol, or use illegal drugs. Some items may interact with your medicine. What should I watch for while using this medication? Your condition will be monitored carefully while you are receiving this medication. You may need blood work while taking this medication. This medication may increase your risk of getting an infection. Call your care team for advice if you get a fever, chills, sore throat, or other symptoms of a cold or flu. Do not treat yourself. Try to avoid being around people who are sick. You should make sure you get enough calcium  and vitamin D  while you are taking this medication, unless your care team tells you not to. Discuss the foods you eat and the vitamins you take with your care team. Some people who take this medication have severe bone, joint, or muscle pain. This medication may also increase your risk for jaw problems or a broken thigh bone. Tell your care team right away if you have severe pain in your jaw, bones, joints, or muscles. Tell your care team if you have any pain that does not go away or that gets worse. Talk  to your care team if you may be pregnant. Serious birth defects can occur if you take this medication during pregnancy and for 5 months after the last dose. You will need a negative pregnancy test before starting this medication. Contraception is recommended while taking this medication and for 5 months after the last dose. Your care team  can help you find the option that works for you. What side effects may I notice from receiving this medication? Side effects that you should report to your care team as soon as possible: Allergic reactions--skin rash, itching, hives, swelling of the face, lips, tongue, or throat Bone, joint, or muscle pain Low calcium  level--muscle pain or cramps, confusion, tingling, or numbness in the hands or feet Osteonecrosis of the jaw--pain, swelling, or redness in the mouth, numbness of the jaw, poor healing after dental work, unusual discharge from the mouth, visible bones in the mouth Side effects that usually do not require medical attention (report to your care team if they continue or are bothersome): Cough Diarrhea Fatigue Headache Nausea This list may not describe all possible side effects. Call your doctor for medical advice about side effects. You may report side effects to FDA at 1-800-FDA-1088. Where should I keep my medication? This medication is given in a hospital or clinic. It will not be stored at home. NOTE: This sheet is a summary. It may not cover all possible information. If you have questions about this medicine, talk to your doctor, pharmacist, or health care provider.  2024 Elsevier/Gold Standard (2021-08-07 00:00:00)

## 2024-02-03 ENCOUNTER — Other Ambulatory Visit (HOSPITAL_BASED_OUTPATIENT_CLINIC_OR_DEPARTMENT_OTHER): Payer: Self-pay

## 2024-02-04 ENCOUNTER — Ambulatory Visit (HOSPITAL_COMMUNITY)
Admission: RE | Admit: 2024-02-04 | Discharge: 2024-02-04 | Disposition: A | Discharge status: Home / Self Care | Source: Ambulatory Visit | Attending: Hematology & Oncology | Admitting: Hematology & Oncology

## 2024-02-04 ENCOUNTER — Other Ambulatory Visit: Payer: Self-pay | Admitting: Hematology & Oncology

## 2024-02-04 DIAGNOSIS — C7951 Secondary malignant neoplasm of bone: Secondary | ICD-10-CM

## 2024-02-04 DIAGNOSIS — J90 Pleural effusion, not elsewhere classified: Secondary | ICD-10-CM | POA: Insufficient documentation

## 2024-02-04 DIAGNOSIS — C3490 Malignant neoplasm of unspecified part of unspecified bronchus or lung: Secondary | ICD-10-CM | POA: Insufficient documentation

## 2024-02-04 HISTORY — PX: IR THORACENTESIS ASP PLEURAL SPACE W/IMG GUIDE: IMG5380

## 2024-02-04 LAB — BODY FLUID CELL COUNT WITH DIFFERENTIAL
Eos, Fluid: 58 %
Lymphs, Fluid: 31 %
Monocyte-Macrophage-Serous Fluid: 8 % — ABNORMAL LOW (ref 50–90)
Neutrophil Count, Fluid: 3 % (ref 0–25)
Total Nucleated Cell Count, Fluid: 3866 uL — ABNORMAL HIGH (ref 0–1000)

## 2024-02-04 LAB — PROTEIN, PLEURAL OR PERITONEAL FLUID: Total protein, fluid: 4.3 g/dL

## 2024-02-04 LAB — LACTATE DEHYDROGENASE, PLEURAL OR PERITONEAL FLUID: LD, Fluid: 374 U/L — ABNORMAL HIGH (ref 3–23)

## 2024-02-04 MED ORDER — LIDOCAINE-EPINEPHRINE (PF) 1 %-1:200000 IJ SOLN
10.0000 mL | Freq: Once | INTRAMUSCULAR | Status: AC
  Administered 2024-02-04: 10 mL

## 2024-02-04 MED ORDER — LIDOCAINE-EPINEPHRINE 1 %-1:100000 IJ SOLN
INTRAMUSCULAR | Status: AC
  Filled 2024-02-04: qty 1

## 2024-02-04 NOTE — Procedures (Signed)
 PROCEDURE SUMMARY:  Successful US  guided right thoracentesis. Yielded 800 ml of clear yellow fluid. Pt tolerated procedure well. No immediate complications.  Specimen sent for labs. CXR ordered; small apical post-procedure pneumothorax identified. Patient is asymptomatic. Return precautions discussed. Patient knows to seek immediate medical attention if he develops chest/shoulder/back pain or shortness of breath.   EBL < 2 mL  Warren JONELLE Dais, NP 02/04/2024 2:16 PM

## 2024-02-08 ENCOUNTER — Encounter: Payer: Self-pay | Admitting: Hematology & Oncology

## 2024-02-08 LAB — PATHOLOGIST SMEAR REVIEW

## 2024-03-03 ENCOUNTER — Inpatient Hospital Stay: Admitting: Hematology & Oncology

## 2024-03-03 ENCOUNTER — Encounter: Payer: Self-pay | Admitting: Hematology & Oncology

## 2024-03-03 ENCOUNTER — Inpatient Hospital Stay: Attending: Hematology & Oncology

## 2024-03-03 ENCOUNTER — Other Ambulatory Visit (HOSPITAL_BASED_OUTPATIENT_CLINIC_OR_DEPARTMENT_OTHER): Payer: Self-pay

## 2024-03-03 ENCOUNTER — Other Ambulatory Visit: Payer: Self-pay

## 2024-03-03 VITALS — BP 106/59 | HR 78 | Temp 98.0°F | Resp 19 | Ht 72.0 in | Wt 156.0 lb

## 2024-03-03 DIAGNOSIS — C3490 Malignant neoplasm of unspecified part of unspecified bronchus or lung: Secondary | ICD-10-CM

## 2024-03-03 DIAGNOSIS — Z85118 Personal history of other malignant neoplasm of bronchus and lung: Secondary | ICD-10-CM | POA: Diagnosis present

## 2024-03-03 DIAGNOSIS — F17200 Nicotine dependence, unspecified, uncomplicated: Secondary | ICD-10-CM | POA: Diagnosis not present

## 2024-03-03 DIAGNOSIS — C7951 Secondary malignant neoplasm of bone: Secondary | ICD-10-CM | POA: Diagnosis not present

## 2024-03-03 LAB — CMP (CANCER CENTER ONLY)
ALT: 5 U/L (ref 0–44)
AST: 12 U/L — ABNORMAL LOW (ref 15–41)
Albumin: 3.8 g/dL (ref 3.5–5.0)
Alkaline Phosphatase: 121 U/L (ref 38–126)
Anion gap: 11 (ref 5–15)
BUN: 22 mg/dL (ref 8–23)
CO2: 23 mmol/L (ref 22–32)
Calcium: 9.6 mg/dL (ref 8.9–10.3)
Chloride: 102 mmol/L (ref 98–111)
Creatinine: 1.19 mg/dL (ref 0.61–1.24)
GFR, Estimated: >60 mL/min (ref >60)
Glucose, Bld: 108 mg/dL — ABNORMAL HIGH (ref 70–99)
Potassium: 4.2 mmol/L (ref 3.5–5.1)
Sodium: 136 mmol/L (ref 135–145)
Total Bilirubin: 0.4 mg/dL (ref 0.0–1.2)
Total Protein: 6.7 g/dL (ref 6.5–8.1)

## 2024-03-03 LAB — CBC WITH DIFFERENTIAL (CANCER CENTER ONLY)
Abs Immature Granulocytes: 0.02 K/uL (ref 0.00–0.07)
Basophils Absolute: 0 K/uL (ref 0.0–0.1)
Basophils Relative: 0 %
Eosinophils Absolute: 0.1 K/uL (ref 0.0–0.5)
Eosinophils Relative: 1 %
HCT: 38.9 % — ABNORMAL LOW (ref 39.0–52.0)
Hemoglobin: 13.2 g/dL (ref 13.0–17.0)
Immature Granulocytes: 0 %
Lymphocytes Relative: 7 %
Lymphs Abs: 0.5 K/uL — ABNORMAL LOW (ref 0.7–4.0)
MCH: 30.1 pg (ref 26.0–34.0)
MCHC: 33.9 g/dL (ref 30.0–36.0)
MCV: 88.8 fL (ref 80.0–100.0)
Monocytes Absolute: 0.5 K/uL (ref 0.1–1.0)
Monocytes Relative: 7 %
Neutro Abs: 6 K/uL (ref 1.7–7.7)
Neutrophils Relative %: 85 %
Platelet Count: 332 K/uL (ref 150–400)
RBC: 4.38 MIL/uL (ref 4.22–5.81)
RDW: 13.4 % (ref 11.5–15.5)
WBC Count: 7.2 K/uL (ref 4.0–10.5)
nRBC: 0 % (ref 0.0–0.2)

## 2024-03-03 LAB — LACTATE DEHYDROGENASE: LDH: 120 U/L (ref 105–235)

## 2024-03-03 MED ORDER — PREGABALIN 150 MG PO CAPS
150.0000 mg | ORAL_CAPSULE | Freq: Three times a day (TID) | ORAL | 0 refills | Status: DC
  Filled 2024-03-03: qty 90, 30d supply, fill #0

## 2024-03-03 NOTE — Progress Notes (Signed)
 Hematology and Oncology Follow Up Visit  ARROW TOMKO [id number] 06/10/1945 78 y.o. 03/03/2024   Principle Diagnosis:  Metastatic non-small cell lung cancer-adenocarcinoma. --- NO actionable mutations  Current Therapy:   Xgeva  120 mg subcu every 3 months-next dose in 05/2024   XRT to the right upper lobe nodule -definitive treatment on 03/09/2023     Interim History:  Mr. Huezo is back for a follow-up.  He is still having a lot of problems with pain over the right side.  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  I told him that he really needs to go back to the thoracic surgeon who did his surgery.  He is not sure who that surgeon was.  From the medical record, looks like the surgeon was Dr. Rainell Bade.  Again, I think we really need to get him back to see him to see if he can help.  I do not know if there is any kind of nerve block that can be done.  The Lyrica  seems to be helping.  I will see about increasing Lyrica  to 150 mg p.o. 3 times daily.  Otherwise, he is doing okay.  The last time that we saw him, he had 800 cc of fluid taken out of the right lung.  Unfortunately, for some reason, cytology was never done on the fluid.  However, I cannot imagine that it is malignant.  He takes Tylenol  and Advil which does help with the pain a little bit.  He cannot put a Lidoderm  patch in that area because he cannot reach it.  He also has a aortic aneurysm.  I think he goes to [facility] follow[facility]up.  He has had no problems with nausea or vomiting.  He has lost weight because is not eating because of pain.  He has had no issues with fever.  He has had no bleeding.  There is been no change in bowel or bladder habits.  Unfortunately, I think he still smokes quite a bit.    I think overall, his performance status is probably ECOG 1.  Medications:  Current Outpatient Medications:    acetaminophen  (TYLENOL ) 500 MG tablet, Take 1,000 mg by mouth every 6 (six) hours as  needed for moderate pain or headache., Disp: , Rfl:    cholecalciferol (VITAMIN D3) 25 MCG (1000 UT) tablet, Take 1,000 Units by mouth daily., Disp: , Rfl:    clopidogrel  (PLAVIX ) 75 MG tablet, Take 1 tablet (75 mg total) by mouth daily., Disp: 90 tablet, Rfl: 3   ibuprofen (ADVIL) 200 MG tablet, Take 200 mg by mouth every 4 (four) hours. As needed, Disp: , Rfl:    lidocaine  (LIDODERM ) 5 %, Place 1 patch onto the skin daily. Remove & Discard patch within 12 hours or as directed by MD, Disp: 30 patch, Rfl: 4   losartan  (COZAAR ) 100 MG tablet, Take 50 mg by mouth at bedtime., Disp: , Rfl:    nitroGLYCERIN  (NITROSTAT ) 0.4 MG SL tablet, Place 1 tablet (0.4 mg total) under the tongue every 5 (five) minutes as needed for chest pain. (Patient not taking: Reported on 01/19/2024), Disp: 25 tablet, Rfl: 4   polyethylene glycol (MIRALAX  / GLYCOLAX ) 17 g packet, Take 17 g by mouth daily as needed (Constipation)., Disp: 14 each, Rfl: 0   pregabalin  (LYRICA ) 100 MG capsule, Take 1 capsule (100 mg total) by mouth 3 (three) times daily., Disp: 90 capsule, Rfl: 2   sildenafil  (VIAGRA ) 100 MG tablet, Take 1  tablet (100 mg total) by mouth daily as needed for erectile dysfunction., Disp: 10 tablet, Rfl: 6   sildenafil  (VIAGRA ) 100 MG tablet, Take 1 tablet (100 mg total) by mouth daily as needed for erectile dysfunction. (Patient not taking: Reported on 01/19/2024), Disp: 10 tablet, Rfl: 3   simethicone  (MYLICON) 80 MG chewable tablet, Chew 80 mg by mouth as needed for flatulence., Disp: , Rfl:  No current facility-administered medications for this visit.  Facility-Administered Medications Ordered in Other Visits:    0.9 %  sodium chloride  infusion, , Intra-arterial, Continuous PRN, Harrold Macintosh, CRNA, Last Rate: 5 mL/hr at 01/19/24 0635, New Bag at 01/19/24 9364   lactated ringers  infusion, , Intravenous, Continuous PRN, Harrold Macintosh, CRNA, New Bag at 01/19/24 0710  Allergies:  Allergies  Allergen  Reactions   Brilinta  [Ticagrelor ] Shortness Of Breath     Patient was taking 60 mg  Twice a day - changed to Clopidogrel  75 mg  03/02/19   Ciprofloxacin Anaphylaxis   Alirocumab  Other (See Comments)    Fatigue, diarrhea **Praluent **   Statins Other (See Comments)    Joint pain.Myalgias -- HAS TRIED LIPITOR, ZOCOR & CRESTOR     Past Medical History, Surgical history, Social history, and Family History were reviewed and updated.  Review of Systems: Review of Systems  Constitutional:  Positive for unexpected weight change.  HENT:  Negative.    Eyes: Negative.   Respiratory: Negative.    Cardiovascular:  Positive for chest pain.  Gastrointestinal: Negative.   Endocrine: Negative.   Genitourinary: Negative.    Musculoskeletal: Negative.   Skin: Negative.   Neurological: Negative.   Hematological: Negative.   Psychiatric/Behavioral: Negative.      Physical Exam: Vital signs show temperature 98.  Pulse 78.  Blood pressure 106/59.  Weight is 156 pounds.    Wt Readings from Last 3 Encounters:  03/03/24 156 lb (70.8 kg)  02/01/24 158 lb (71.7 kg)  01/19/24 160 lb (72.6 kg)    Physical Exam Vitals reviewed.  HENT:     Head: Normocephalic and atraumatic.  Eyes:     Pupils: Pupils are equal, round, and reactive to light.  Cardiovascular:     Rate and Rhythm: Normal rate and regular rhythm.     Heart sounds: Normal heart sounds.  Pulmonary:     Effort: Pulmonary effort is normal.     Breath sounds: Normal breath sounds.  Abdominal:     General: Bowel sounds are normal.     Palpations: Abdomen is soft.  Musculoskeletal:        General: No tenderness or deformity. Normal range of motion.     Cervical back: Normal range of motion.     Comments: He does have limited range of motion of the right shoulder.  There is some weakness in the right arm.    Lymphadenopathy:     Cervical: No cervical adenopathy.  Skin:    General: Skin is warm and dry.     Findings: No erythema or  rash.  Neurological:     Mental Status: He is alert and oriented to person, place, and time.  Psychiatric:        Behavior: Behavior normal.        Thought Content: Thought content normal.        Judgment: Judgment normal.      Lab Results  Component Value Date   WBC 7.2 03/03/2024   HGB 13.2 03/03/2024   HCT 38.9 (L) 03/03/2024   MCV 88.8 03/03/2024  PLT 332 03/03/2024     Chemistry      Component Value Date/Time   NA 136 03/03/2024 1506   NA 138 04/11/2021 1130   K 4.2 03/03/2024 1506   CL 102 03/03/2024 1506   CO2 23 03/03/2024 1506   BUN 22 03/03/2024 1506   BUN 20 04/11/2021 1130   CREATININE 1.19 03/03/2024 1506      Component Value Date/Time   CALCIUM  9.6 03/03/2024 1506   ALKPHOS 121 03/03/2024 1506   AST 12 (L) 03/03/2024 1506   ALT 5 03/03/2024 1506   BILITOT 0.4 03/03/2024 1506       Impression and Plan: Mr. Holtsclaw is a very nice 78 year old white male.  He has a history of adenocarcinoma of the lung.  He seemed to have an area of disease on MRI that was in the right eighth and 10th rib.  I think he had radiation for this as completed in February.  He had a PET scan done back in August which looked okay.  Again I do not think that the pleural fluid was malignant.  I do think that it would not be a bad idea to get him back to the thoracic surgeon who did his lung cancer surgery.  I will have to see about getting him back there.  I really think he needs to see the original surgeon and he may be able to help out.  Hopefully, the increase in Lyrica  will help with some of the discomfort.  I want to see him back in about a month or so.   Maude JONELLE Crease, MD 12/4/20254:07 PM

## 2024-03-04 ENCOUNTER — Other Ambulatory Visit: Payer: Self-pay | Admitting: *Deleted

## 2024-03-04 DIAGNOSIS — C3490 Malignant neoplasm of unspecified part of unspecified bronchus or lung: Secondary | ICD-10-CM

## 2024-03-04 DIAGNOSIS — C7A090 Malignant carcinoid tumor of the bronchus and lung: Secondary | ICD-10-CM

## 2024-03-04 NOTE — Progress Notes (Signed)
 Referral sent to Dr Renda, including medical records and facesheet. [phone]

## 2024-03-08 ENCOUNTER — Telehealth: Payer: Self-pay

## 2024-03-08 NOTE — Telephone Encounter (Signed)
 Called [facility] to follow up on patient's referral. Spoke with Delon . She told me that their clinical team will need to review the images of the patient's last scans, and there records need to be sent through [employer]. I called the radiology reading room, and I was provided with the number for Health Information at [facility] [phone]). Iwas told to call the radiology Dept since the scans were done at their location ( phone # [phone]). I was told that they couldn't find the request for the patient's recodrs and was given the Fax number where they need to be sent (Fax: [phone]).  Called [facility] back to provide them with the correct fax number for the radiology.  Called back radiology and was told that they received the request and that the records were sent accordingly.

## 2024-03-16 ENCOUNTER — Other Ambulatory Visit: Payer: Self-pay

## 2024-03-16 DIAGNOSIS — R52 Pain, unspecified: Secondary | ICD-10-CM

## 2024-03-21 ENCOUNTER — Emergency Department (HOSPITAL_BASED_OUTPATIENT_CLINIC_OR_DEPARTMENT_OTHER)

## 2024-03-21 ENCOUNTER — Emergency Department (HOSPITAL_BASED_OUTPATIENT_CLINIC_OR_DEPARTMENT_OTHER)
Admission: EM | Admit: 2024-03-21 | Discharge: 2024-03-21 | Disposition: A | Discharge status: Home / Self Care | Attending: Emergency Medicine | Admitting: Emergency Medicine

## 2024-03-21 ENCOUNTER — Other Ambulatory Visit: Payer: Self-pay

## 2024-03-21 DIAGNOSIS — Z79899 Other long term (current) drug therapy: Secondary | ICD-10-CM | POA: Diagnosis not present

## 2024-03-21 DIAGNOSIS — J939 Pneumothorax, unspecified: Secondary | ICD-10-CM | POA: Insufficient documentation

## 2024-03-21 DIAGNOSIS — J90 Pleural effusion, not elsewhere classified: Secondary | ICD-10-CM | POA: Diagnosis not present

## 2024-03-21 DIAGNOSIS — R059 Cough, unspecified: Secondary | ICD-10-CM | POA: Insufficient documentation

## 2024-03-21 DIAGNOSIS — J019 Acute sinusitis, unspecified: Secondary | ICD-10-CM

## 2024-03-21 DIAGNOSIS — R0981 Nasal congestion: Secondary | ICD-10-CM | POA: Diagnosis not present

## 2024-03-21 LAB — RESP PANEL BY RT-PCR (RSV, FLU A&B, COVID)  RVPGX2
Influenza A by PCR: NEGATIVE
Influenza B by PCR: NEGATIVE
Resp Syncytial Virus by PCR: NEGATIVE
SARS Coronavirus 2 by RT PCR: NEGATIVE

## 2024-03-21 MED ORDER — DOXYCYCLINE HYCLATE 100 MG PO CAPS: 100.0000 mg | ORAL_CAPSULE | Freq: Two times a day (BID) | ORAL | 0 refills | Status: AC

## 2024-03-21 NOTE — ED Provider Notes (Signed)
 " [place] EMERGENCY DEPARTMENT AT [facility] Provider Note   CSN: [id number] Arrival date & time: 03/21/24  9442     Patient presents with: Cough and Nasal Congestion   Patrick Adams is a 78 y.o. male.   Patient is a 78 year old male presenting with sinus congestion and pressure worsening over the past week.  He has had cough that has been intermittently productive.  No chest pain or difficulty breathing.  No fevers or chills.  He denies any ill contacts.       Prior to Admission medications  Medication Sig Start Date End Date Taking? Authorizing Provider  acetaminophen  (TYLENOL ) 500 MG tablet Take 1,000 mg by mouth every 6 (six) hours as needed for moderate pain or headache.    [provider]  cholecalciferol (VITAMIN D3) 25 MCG (1000 UT) tablet Take 1,000 Units by mouth daily.    [provider]  clopidogrel  (PLAVIX ) 75 MG tablet Take 1 tablet (75 mg total) by mouth daily. 02/25/23   Jerilynn Lamarr HERO, NP  ibuprofen (ADVIL) 200 MG tablet Take 200 mg by mouth every 4 (four) hours. As needed    [provider]  lidocaine  (LIDODERM ) 5 % Place 1 patch onto the skin daily. Remove & Discard patch within 12 hours or as directed by MD 05/12/23   Timmy Maude SAUNDERS, MD  losartan  (COZAAR ) 100 MG tablet Take 50 mg by mouth at bedtime. 03/18/21   [provider]  nitroGLYCERIN  (NITROSTAT ) 0.4 MG SL tablet Place 1 tablet (0.4 mg total) under the tongue every 5 (five) minutes as needed for chest pain. Patient not taking: Reported on 01/19/2024 06/02/23 06/01/24  Anner Alm ORN, MD  polyethylene glycol (MIRALAX  / GLYCOLAX ) 17 g packet Take 17 g by mouth daily as needed (Constipation). 05/24/19   Frann Mabel Mt, DO  pregabalin  (LYRICA ) 150 MG capsule Take 1 capsule (150 mg total) by mouth 3 (three) times daily. 03/03/24   Timmy Maude SAUNDERS, MD  sildenafil  (VIAGRA ) 100 MG tablet Take 1 tablet (100 mg total) by mouth daily as needed for erectile  dysfunction. 09/10/20   Anner Alm ORN, MD  sildenafil  (VIAGRA ) 100 MG tablet Take 1 tablet (100 mg total) by mouth daily as needed for erectile dysfunction. Patient not taking: Reported on 01/19/2024 11/23/23   Timmy Maude SAUNDERS, MD  simethicone  (MYLICON) 80 MG chewable tablet Chew 80 mg by mouth as needed for flatulence.    [provider]    Allergies: Brilinta  [ticagrelor ], Ciprofloxacin, Alirocumab , and Statins    Review of Systems  All other systems reviewed and are negative.   Updated Vital Signs Ht 6' (1.829 m)   Wt 71.7 kg   BMI 21.43 kg/m   Physical Exam Vitals and nursing note reviewed.  Constitutional:      General: He is not in acute distress.    Appearance: He is well-developed. He is not diaphoretic.  HENT:     Head: Normocephalic and atraumatic.  Cardiovascular:     Rate and Rhythm: Normal rate and regular rhythm.     Heart sounds: No murmur heard.    No friction rub.  Pulmonary:     Effort: Pulmonary effort is normal. No respiratory distress.     Breath sounds: Normal breath sounds. No wheezing or rales.  Abdominal:     General: Bowel sounds are normal. There is no distension.     Palpations: Abdomen is soft.     Tenderness: There is no abdominal tenderness.  Musculoskeletal:        General: Normal range of motion.     Cervical back: Normal range of motion and neck supple.  Skin:    General: Skin is warm and dry.  Neurological:     Mental Status: He is alert and oriented to person, place, and time.     Coordination: Coordination normal.     (all labs ordered are listed, but only abnormal results are displayed) Labs Reviewed  RESP PANEL BY RT-PCR (RSV, FLU A&B, COVID)  [id number]    EKG: None  Radiology: DG Chest Portable 1 View Result Date: 03/21/2024 CLINICAL DATA:  Cough and congestion for 2 weeks. EXAM: PORTABLE CHEST 1 VIEW COMPARISON:  February 04, 2024 FINDINGS: The heart size and mediastinal contours are within normal limits. 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 Decreased lung volumes are seen on the right with mild right basilar scarring and/or atelectasis. Radiopaque surgical clips are present within the mid right lung and right hilar region. There is a small right pleural effusion. A small amount of right apical pleural fluid is also suspected. 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 No acute osseous abnormality is identified. IMPRESSION: 1. Postoperative changes within the right lung with mild right basilar scarring and/or atelectasis. 2. Small right pleural effusion. 3. Small, stable, likely chronic right-sided pneumothorax. Electronically Signed   By: Suzen Dials M.D.   On: 03/21/2024 06:32     Procedures   Medications Ordered in the ED - No data to display                                  Medical Decision Making Amount and/or Complexity of Data Reviewed Radiology: ordered.   Patient presenting with greater than 1 week of sinus congestion and pressure.  He has history of sinusitis and this feels similar.  Patient will be treated with doxycycline  and follow-up as needed.  His chest x-ray here shows no acute process and COVID/flu/RSV are all negative.     Final diagnoses:  None    ED Discharge Orders     None          Geroldine Berg, MD 03/21/24 [phone]  "

## 2024-03-21 NOTE — ED Triage Notes (Signed)
 POV cough and congestion past 2 weeks. +SOB

## 2024-03-21 NOTE — Discharge Instructions (Signed)
 Begin taking doxycycline  as prescribed.  Take over-the-counter decongestants and cough medication as needed for relief of symptoms.  Follow-up with primary doctor if not improving in the next week.

## 2024-04-04 ENCOUNTER — Other Ambulatory Visit: Payer: Self-pay | Admitting: Hematology & Oncology

## 2024-04-04 ENCOUNTER — Other Ambulatory Visit (HOSPITAL_BASED_OUTPATIENT_CLINIC_OR_DEPARTMENT_OTHER): Payer: Self-pay

## 2024-04-04 MED ORDER — PREGABALIN 150 MG PO CAPS
150.0000 mg | ORAL_CAPSULE | Freq: Three times a day (TID) | ORAL | 0 refills | Status: AC
  Filled 2024-04-04 – 2024-04-11 (×2): qty 90, 30d supply, fill #0

## 2024-04-05 ENCOUNTER — Telehealth: Payer: Self-pay

## 2024-04-05 NOTE — Telephone Encounter (Signed)
 Pt called in stating he is SOB and think his dose of Lyrica  150 mg TID is too strong and causing this. He also stated he was shaky, congested and has thick cream sputum that he coughs up at times. He declined fever, n/v/d and states this has been going on since he started Lyrica  in 05/2023. Per Dr Timmy ok to decrease to 150 mg BID. Pt states that he has 100 mg tablets on hand and would like to know if Dr Timmy is ok if he takes that TID or even cuts back to original dose of BID. Advised pt Dr Timmy has left for the day but I would let him know tomorrow. Also advised pt to go to the ER if SOB gets worse. Pt stated understanding.

## 2024-04-06 ENCOUNTER — Encounter: Payer: Self-pay | Admitting: Hematology & Oncology

## 2024-04-06 ENCOUNTER — Other Ambulatory Visit: Payer: Self-pay

## 2024-04-06 NOTE — Telephone Encounter (Signed)
 Dr Timmy ok with 100 mg BID. Pt advised and states understanding. Pt states he may try to increase to 100 mg TID if he feels better on the lower dose- Dr Timmy ok with that.

## 2024-04-11 ENCOUNTER — Other Ambulatory Visit (HOSPITAL_BASED_OUTPATIENT_CLINIC_OR_DEPARTMENT_OTHER): Payer: Self-pay

## 2024-04-14 ENCOUNTER — Inpatient Hospital Stay: Attending: Hematology & Oncology

## 2024-04-14 ENCOUNTER — Inpatient Hospital Stay: Admitting: Hematology & Oncology

## 2024-04-14 ENCOUNTER — Encounter: Payer: Self-pay | Admitting: Hematology & Oncology

## 2024-04-14 ENCOUNTER — Other Ambulatory Visit: Payer: Self-pay

## 2024-04-14 VITALS — BP 114/53 | HR 76 | Temp 97.4°F | Resp 19 | Ht 72.0 in | Wt 154.0 lb

## 2024-04-14 DIAGNOSIS — Z79899 Other long term (current) drug therapy: Secondary | ICD-10-CM | POA: Insufficient documentation

## 2024-04-14 DIAGNOSIS — J449 Chronic obstructive pulmonary disease, unspecified: Secondary | ICD-10-CM | POA: Diagnosis not present

## 2024-04-14 DIAGNOSIS — C7951 Secondary malignant neoplasm of bone: Secondary | ICD-10-CM

## 2024-04-14 DIAGNOSIS — Z7902 Long term (current) use of antithrombotics/antiplatelets: Secondary | ICD-10-CM | POA: Insufficient documentation

## 2024-04-14 DIAGNOSIS — Z85118 Personal history of other malignant neoplasm of bronchus and lung: Secondary | ICD-10-CM | POA: Insufficient documentation

## 2024-04-14 DIAGNOSIS — C3490 Malignant neoplasm of unspecified part of unspecified bronchus or lung: Secondary | ICD-10-CM

## 2024-04-14 LAB — CBC WITH DIFFERENTIAL (CANCER CENTER ONLY)
Abs Immature Granulocytes: 0.03 K/uL (ref 0.00–0.07)
Basophils Absolute: 0 K/uL (ref 0.0–0.1)
Basophils Relative: 0 %
Eosinophils Absolute: 0 K/uL (ref 0.0–0.5)
Eosinophils Relative: 0 %
HCT: 37.6 % — ABNORMAL LOW (ref 39.0–52.0)
Hemoglobin: 12.3 g/dL — ABNORMAL LOW (ref 13.0–17.0)
Immature Granulocytes: 0 %
Lymphocytes Relative: 7 %
Lymphs Abs: 0.6 K/uL — ABNORMAL LOW (ref 0.7–4.0)
MCH: 29.4 pg (ref 26.0–34.0)
MCHC: 32.7 g/dL (ref 30.0–36.0)
MCV: 90 fL (ref 80.0–100.0)
Monocytes Absolute: 0.5 K/uL (ref 0.1–1.0)
Monocytes Relative: 5 %
Neutro Abs: 7.9 K/uL — ABNORMAL HIGH (ref 1.7–7.7)
Neutrophils Relative %: 88 %
Platelet Count: 397 K/uL (ref 150–400)
RBC: 4.18 MIL/uL — ABNORMAL LOW (ref 4.22–5.81)
RDW: 15.5 % (ref 11.5–15.5)
WBC Count: 9.1 K/uL (ref 4.0–10.5)
nRBC: 0 % (ref 0.0–0.2)

## 2024-04-14 LAB — CMP (CANCER CENTER ONLY)
ALT: 6 U/L (ref 0–44)
AST: 11 U/L — ABNORMAL LOW (ref 15–41)
Albumin: 3.6 g/dL (ref 3.5–5.0)
Alkaline Phosphatase: 116 U/L (ref 38–126)
Anion gap: 14 (ref 5–15)
BUN: 20 mg/dL (ref 8–23)
CO2: 19 mmol/L — ABNORMAL LOW (ref 22–32)
Calcium: 8.8 mg/dL — ABNORMAL LOW (ref 8.9–10.3)
Chloride: 104 mmol/L (ref 98–111)
Creatinine: 1.13 mg/dL (ref 0.61–1.24)
GFR, Estimated: >60 mL/min
Glucose, Bld: 83 mg/dL (ref 70–99)
Potassium: 3.9 mmol/L (ref 3.5–5.1)
Sodium: 137 mmol/L (ref 135–145)
Total Bilirubin: 0.3 mg/dL (ref 0.0–1.2)
Total Protein: 6.4 g/dL — ABNORMAL LOW (ref 6.5–8.1)

## 2024-04-14 MED ORDER — IPRATROPIUM-ALBUTEROL 20-100 MCG/ACT IN AERS: 1.0000 | INHALATION_SPRAY | Freq: Four times a day (QID) | RESPIRATORY_TRACT | 4 refills | Status: AC | PRN

## 2024-04-14 NOTE — Progress Notes (Signed)
 " Hematology and Oncology Follow Up Visit  PHI AVANS [id number] Aug 28, 1945 79 y.o. 04/14/2024   Principle Diagnosis:  Metastatic non-small cell lung cancer-adenocarcinoma. --- NO actionable mutations  Current Therapy:   Xgeva  120 mg subcu every 3 months-next dose in 05/2024   XRT to the right upper lobe nodule -definitive treatment on 03/09/2023     Interim History:  Mr. Mowrey is back for a follow-up.  He is still having a lot of problems with pain over the right side.  He actually saw his Cardiothoracic surgeon.  Unfortunately, his cardiothoracic surgeon really cannot help him out.  He really may no suggestions.  Mr. Jeoffrey still takes a lot of Tylenol  and Motrin.  This does seem to help.  He will not take Toradol  because of what it makes him feel.  He takes Lyrica  twice a day.  This helps a little bit.  I am just not sure how else we can help.  I do not know if there is any kind of pain specialist who can do nerve blocks.  I will have to look into this.  His last PET scan was done back in August.  I think we probably need to get another 1 on him.  His appetite is marginal.  He says he just has a hard time eating or having an appetite because of his pain.  He has had no change in bowel or bladder habits.  He  does have underlying COPD.  I will send in a prescription for an inhaler.  Currently, I would say that his performance status is probably ECOG 2.    Medications:  Current Outpatient Medications:    acetaminophen  (TYLENOL ) 500 MG tablet, Take 1,000 mg by mouth every 6 (six) hours as needed for moderate pain or headache., Disp: , Rfl:    cholecalciferol (VITAMIN D3) 25 MCG (1000 UT) tablet, Take 1,000 Units by mouth daily., Disp: , Rfl:    clopidogrel  (PLAVIX ) 75 MG tablet, Take 1 tablet (75 mg total) by mouth daily., Disp: 90 tablet, Rfl: 3   doxycycline  (VIBRAMYCIN ) 100 MG capsule, Take 1 capsule (100 mg total) by mouth 2 (two) times daily. One po bid x 7 days, Disp: 14  capsule, Rfl: 0   ibuprofen (ADVIL) 200 MG tablet, Take 200 mg by mouth every 4 (four) hours. As needed, Disp: , Rfl:    lidocaine  (LIDODERM ) 5 %, Place 1 patch onto the skin daily. Remove & Discard patch within 12 hours or as directed by MD, Disp: 30 patch, Rfl: 4   losartan  (COZAAR ) 100 MG tablet, TAKE 1 TABLET BY MOUTH EVERY DAY, Disp: 90 tablet, Rfl: 3   nitroGLYCERIN  (NITROSTAT ) 0.4 MG SL tablet, Place 1 tablet (0.4 mg total) under the tongue every 5 (five) minutes as needed for chest pain. (Patient not taking: Reported on 01/19/2024), Disp: 25 tablet, Rfl: 4   polyethylene glycol (MIRALAX  / GLYCOLAX ) 17 g packet, Take 17 g by mouth daily as needed (Constipation)., Disp: 14 each, Rfl: 0   pregabalin  (LYRICA ) 150 MG capsule, Take 1 capsule (150 mg total) by mouth 3 (three) times daily., Disp: 90 capsule, Rfl: 0   sildenafil  (VIAGRA ) 100 MG tablet, Take 1 tablet (100 mg total) by mouth daily as needed for erectile dysfunction., Disp: 10 tablet, Rfl: 6   simethicone  (MYLICON) 80 MG chewable tablet, Chew 80 mg by mouth as needed for flatulence., Disp: , Rfl:  No current facility-administered medications for this visit.  Facility-Administered Medications Ordered in Other  Visits:    0.9 %  sodium chloride  infusion, , Intra-arterial, Continuous PRN, Harrold Macintosh, CRNA, Last Rate: 5 mL/hr at 01/19/24 0635, New Bag at 01/19/24 9364   lactated ringers  infusion, , Intravenous, Continuous PRN, Harrold Macintosh, CRNA, New Bag at 01/19/24 0710  Allergies:  Allergies  Allergen Reactions   Brilinta  [Ticagrelor ] Shortness Of Breath     Patient was taking 60 mg  Twice a day - changed to Clopidogrel  75 mg  03/02/19   Ciprofloxacin Anaphylaxis   Alirocumab  Other (See Comments)    Fatigue, diarrhea **Praluent **   Statins Other (See Comments)    Joint pain.Myalgias -- HAS TRIED LIPITOR, ZOCOR & CRESTOR     Past Medical History, Surgical history, Social history, and Family History were reviewed and  updated.  Review of Systems: Review of Systems  Constitutional:  Positive for unexpected weight change.  HENT:  Negative.    Eyes: Negative.   Respiratory: Negative.    Cardiovascular:  Positive for chest pain.  Gastrointestinal: Negative.   Endocrine: Negative.   Genitourinary: Negative.    Musculoskeletal: Negative.   Skin: Negative.   Neurological: Negative.   Hematological: Negative.   Psychiatric/Behavioral: Negative.      Physical Exam: Vital signs show temperature 97.4.  Pulse 76.  Blood pressure 114/53.  Weight is 154 pounds.     Wt Readings from Last 3 Encounters:  04/14/24 154 lb (69.9 kg)  03/21/24 158 lb (71.7 kg)  03/03/24 156 lb (70.8 kg)    Physical Exam Vitals reviewed.  HENT:     Head: Normocephalic and atraumatic.  Eyes:     Pupils: Pupils are equal, round, and reactive to light.  Cardiovascular:     Rate and Rhythm: Normal rate and regular rhythm.     Heart sounds: Normal heart sounds.  Pulmonary:     Effort: Pulmonary effort is normal.     Breath sounds: Normal breath sounds.  Abdominal:     General: Bowel sounds are normal.     Palpations: Abdomen is soft.  Musculoskeletal:        General: No tenderness or deformity. Normal range of motion.     Cervical back: Normal range of motion.     Comments: He does have limited range of motion of the right shoulder.  There is some weakness in the right arm.    Lymphadenopathy:     Cervical: No cervical adenopathy.  Skin:    General: Skin is warm and dry.     Findings: No erythema or rash.  Neurological:     Mental Status: He is alert and oriented to person, place, and time.  Psychiatric:        Behavior: Behavior normal.        Thought Content: Thought content normal.        Judgment: Judgment normal.      Lab Results  Component Value Date   WBC 9.1 04/14/2024   HGB 12.3 (L) 04/14/2024   HCT 37.6 (L) 04/14/2024   MCV 90.0 04/14/2024   PLT 397 04/14/2024     Chemistry      Component  Value Date/Time   NA 137 04/14/2024 1452   NA 138 04/11/2021 1130   K 3.9 04/14/2024 1452   CL 104 04/14/2024 1452   CO2 19 (L) 04/14/2024 1452   BUN 20 04/14/2024 1452   BUN 20 04/11/2021 1130   CREATININE 1.13 04/14/2024 1452      Component Value Date/Time   CALCIUM  8.8 (L) 04/14/2024  1452   ALKPHOS 116 04/14/2024 1452   AST 11 (L) 04/14/2024 1452   ALT 6 04/14/2024 1452   BILITOT 0.3 04/14/2024 1452       Impression and Plan: Mr. Lanz is a very nice 79 year old white male.  He has a history of adenocarcinoma of the lung.  He seemed to have an area of disease on MRI that was in the right eighth and 10th rib.  I think he had radiation for this as completed in February.  He had a PET scan done back in August which looked okay.  I will set him up with another 1.  I think this would certainly be helpful.  Again he has the underlying COPD.  Hopefully the inhaler will help if he has any flareups.  We will have to see if there is any way we can do a referral for some kind of pain management.  Hopefully this will help him out.  I will plan to see him back in a couple of months.   Maude JONELLE Crease, MD 1/15/20263:46 PM "

## 2024-04-15 ENCOUNTER — Other Ambulatory Visit: Payer: Self-pay | Admitting: *Deleted

## 2024-04-15 DIAGNOSIS — C3490 Malignant neoplasm of unspecified part of unspecified bronchus or lung: Secondary | ICD-10-CM

## 2024-04-15 DIAGNOSIS — R52 Pain, unspecified: Secondary | ICD-10-CM

## 2024-04-15 DIAGNOSIS — C7951 Secondary malignant neoplasm of bone: Secondary | ICD-10-CM

## 2024-04-15 DIAGNOSIS — C7A090 Malignant carcinoid tumor of the bronchus and lung: Secondary | ICD-10-CM

## 2024-04-24 ENCOUNTER — Other Ambulatory Visit: Payer: Self-pay | Admitting: Adult Health

## 2024-04-26 ENCOUNTER — Other Ambulatory Visit: Payer: Self-pay | Admitting: Hematology & Oncology

## 2024-04-26 DIAGNOSIS — C3490 Malignant neoplasm of unspecified part of unspecified bronchus or lung: Secondary | ICD-10-CM

## 2024-04-28 ENCOUNTER — Ambulatory Visit (HOSPITAL_COMMUNITY)
Admission: RE | Admit: 2024-04-28 | Discharge: 2024-04-28 | Disposition: A | Discharge status: Home / Self Care | Source: Ambulatory Visit | Attending: Radiology | Admitting: Radiology

## 2024-04-28 ENCOUNTER — Other Ambulatory Visit (HOSPITAL_COMMUNITY): Payer: Self-pay | Admitting: Radiology

## 2024-04-28 ENCOUNTER — Ambulatory Visit (HOSPITAL_COMMUNITY)
Admission: RE | Admit: 2024-04-28 | Discharge: 2024-04-28 | Disposition: A | Discharge status: Home / Self Care | Source: Ambulatory Visit | Attending: Hematology & Oncology | Admitting: Hematology & Oncology

## 2024-04-28 DIAGNOSIS — J90 Pleural effusion, not elsewhere classified: Secondary | ICD-10-CM | POA: Insufficient documentation

## 2024-04-28 DIAGNOSIS — C7951 Secondary malignant neoplasm of bone: Secondary | ICD-10-CM

## 2024-04-28 MED ORDER — LIDOCAINE-EPINEPHRINE 1 %-1:100000 IJ SOLN
20.0000 mL | Freq: Once | INTRAMUSCULAR | Status: AC
  Administered 2024-04-28: 10 mL

## 2024-04-28 MED ORDER — LIDOCAINE-EPINEPHRINE 1 %-1:100000 IJ SOLN
INTRAMUSCULAR | Status: AC
  Filled 2024-04-28: qty 20

## 2024-04-28 NOTE — Procedures (Addendum)
 PROCEDURE SUMMARY:  Successful image-guided right thoracentesis. Yielded 1.2 liters of slightly hazy yellow fluid. Patient tolerated procedure well. EBL: trace No immediate complications.  Specimen was sent for labs. 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 Imaging was reviewed with Dr. Jenna.  Please see imaging section of Epic for full dictation.  Kimble DEL Navi Ewton PA-C 04/28/2024 11:39 AM

## 2024-04-28 NOTE — Progress Notes (Signed)
 Spoke with the Patient today. Red Rule verified YES. The purpose of the call was to review and discuss upcoming appointments and health maintenance screenings due within the next year.  NA   Health Maintenance  Topic Date Due   Zoster Vaccine (1 of 2) Never done   DTaP/Tdap/Td Vaccines (1 - Tdap) Never done   Hepatitis A Vaccine (1 of 2 - Risk 2-dose series) Never done   Hepatitis B Vaccine (1 of 3 - Risk 3-dose series) Never done   Colorectal Cancer Screening  04/23/2019   RSV Adult and Pregnancy (1 - 1-dose 75+ series) Never done   Creatinine Level  05/27/2023   Potassium Level  05/27/2023   Influenza Vaccine (1) Never done   Chest CT  02/19/2024   Medicare Annual Wellness  04/07/2024   COVID-19 Vaccine (1) 06/04/2031 (Originally 02/08/1946)   Pneumococcal Vaccine: 50+ Years  Completed   Meningococcal B Vaccine  Aged Out   Meningococcal Conjugate Vaccine  Aged Out   Abdominal Aortic Aneurysm (AAA) Screen  Discontinued       Health Maintenance Reviewed: na  Appointments Scheduled: No visit scheduled  Referrals Placed During This Encounter: N/A  SDOH / Community Referrals Placed This Encounter: N/A  Comments: Patient states that he has too much going on right now. He will schedule lat a later time.  ROI: NA  Care Coordination Note: This chart has been reviewed by a [facility] Specialist to identify and address outstanding health maintenance needs, with the goal of closing quality care gaps and supporting preventive care efforts.

## 2024-05-02 LAB — CYTOLOGY - NON PAP

## 2024-05-03 ENCOUNTER — Telehealth: Payer: Self-pay

## 2024-05-03 NOTE — Telephone Encounter (Signed)
 Called the pain clinic to follow up on patient's referral. Spoke with Justice. They will call the patient tomorrow to schedule an appointment.

## 2024-05-05 ENCOUNTER — Ambulatory Visit: Payer: Self-pay | Admitting: Hematology & Oncology

## 2024-05-05 ENCOUNTER — Encounter: Payer: Self-pay | Admitting: *Deleted

## 2024-05-05 NOTE — Progress Notes (Signed)
 Per Dr Timmy, [facility] One request sent on specimen [id number] DOS 04/28/2024  Oncology Nurse Navigator Documentation     05/05/2024    1:00 PM  Oncology Nurse Navigator Flowsheets  Navigator Location [facility]  Navigator Encounter Type Molecular Studies  Barriers/Navigation Needs Coordination of Care  Interventions Coordination of Care  Coordination of Care Pathology  Time Spent with Patient 30

## 2024-05-06 ENCOUNTER — Other Ambulatory Visit: Payer: Self-pay | Admitting: Hematology & Oncology

## 2024-05-06 ENCOUNTER — Telehealth: Payer: Self-pay

## 2024-05-06 DIAGNOSIS — C7951 Secondary malignant neoplasm of bone: Secondary | ICD-10-CM

## 2024-05-06 MED ORDER — TRAMADOL HCL 50 MG PO TABS: 50.0000 mg | ORAL_TABLET | Freq: Four times a day (QID) | ORAL | 0 refills | Status: AC | PRN

## 2024-05-06 NOTE — Telephone Encounter (Signed)
 Pt called in stating he spoke with Dr Timmy yesterday about his lung cancer returning however, he is in a lot of pain and thinks he is taking too much OTC pain meds. Pt is interested in a Tramadol  rx if Dr Timmy is ok with that. Per Dr Timmy ok to send in Tramadol  50 mg 1 tablet every 6 hours PRN. Pt advised and states understanding.

## 2024-05-16 ENCOUNTER — Inpatient Hospital Stay: Attending: Hematology & Oncology

## 2024-05-16 ENCOUNTER — Encounter (HOSPITAL_COMMUNITY)

## 2024-06-09 ENCOUNTER — Inpatient Hospital Stay: Admitting: Hematology & Oncology

## 2024-06-09 ENCOUNTER — Inpatient Hospital Stay: Attending: Hematology & Oncology

## 2024-07-07 ENCOUNTER — Encounter: Admitting: Physical Medicine and Rehabilitation
